# Patient Record
Sex: Male | Born: 1935 | Race: White | Hispanic: No | Marital: Married | Smoking: Former smoker
Health system: Southern US, Community
[De-identification: ages and names within clinical notes are randomized; demographics above are authoritative.]

## PROBLEM LIST (undated history)

## (undated) DIAGNOSIS — I6529 Occlusion and stenosis of unspecified carotid artery: Secondary | ICD-10-CM

## (undated) DIAGNOSIS — J449 Chronic obstructive pulmonary disease, unspecified: Secondary | ICD-10-CM

## (undated) DIAGNOSIS — K449 Diaphragmatic hernia without obstruction or gangrene: Secondary | ICD-10-CM

## (undated) DIAGNOSIS — R9389 Abnormal findings on diagnostic imaging of other specified body structures: Secondary | ICD-10-CM

## (undated) DIAGNOSIS — I429 Cardiomyopathy, unspecified: Secondary | ICD-10-CM

## (undated) DIAGNOSIS — K5730 Diverticulosis of large intestine without perforation or abscess without bleeding: Secondary | ICD-10-CM

## (undated) DIAGNOSIS — E785 Hyperlipidemia, unspecified: Secondary | ICD-10-CM

## (undated) DIAGNOSIS — R7302 Impaired glucose tolerance (oral): Secondary | ICD-10-CM

## (undated) DIAGNOSIS — D126 Benign neoplasm of colon, unspecified: Secondary | ICD-10-CM

## (undated) DIAGNOSIS — K219 Gastro-esophageal reflux disease without esophagitis: Secondary | ICD-10-CM

## (undated) DIAGNOSIS — E559 Vitamin D deficiency, unspecified: Secondary | ICD-10-CM

## (undated) DIAGNOSIS — M1710 Unilateral primary osteoarthritis, unspecified knee: Secondary | ICD-10-CM

## (undated) DIAGNOSIS — I10 Essential (primary) hypertension: Secondary | ICD-10-CM

## (undated) DIAGNOSIS — M545 Low back pain: Secondary | ICD-10-CM

## (undated) DIAGNOSIS — K648 Other hemorrhoids: Secondary | ICD-10-CM

## (undated) DIAGNOSIS — F1011 Alcohol abuse, in remission: Secondary | ICD-10-CM

## (undated) DIAGNOSIS — K279 Peptic ulcer, site unspecified, unspecified as acute or chronic, without hemorrhage or perforation: Secondary | ICD-10-CM

## (undated) DIAGNOSIS — S32009A Unspecified fracture of unspecified lumbar vertebra, initial encounter for closed fracture: Secondary | ICD-10-CM

## (undated) DIAGNOSIS — R972 Elevated prostate specific antigen [PSA]: Secondary | ICD-10-CM

## (undated) DIAGNOSIS — C679 Malignant neoplasm of bladder, unspecified: Secondary | ICD-10-CM

## (undated) DIAGNOSIS — K7030 Alcoholic cirrhosis of liver without ascites: Secondary | ICD-10-CM

## (undated) DIAGNOSIS — N259 Disorder resulting from impaired renal tubular function, unspecified: Secondary | ICD-10-CM

## (undated) DIAGNOSIS — H9120 Sudden idiopathic hearing loss, unspecified ear: Secondary | ICD-10-CM

## (undated) DIAGNOSIS — M810 Age-related osteoporosis without current pathological fracture: Secondary | ICD-10-CM

## (undated) DIAGNOSIS — I351 Nonrheumatic aortic (valve) insufficiency: Secondary | ICD-10-CM

## (undated) DIAGNOSIS — E739 Lactose intolerance, unspecified: Secondary | ICD-10-CM

## (undated) HISTORY — DX: Other hemorrhoids: K64.8

## (undated) HISTORY — DX: Chronic obstructive pulmonary disease, unspecified: J44.9

## (undated) HISTORY — DX: Diverticulosis of large intestine without perforation or abscess without bleeding: K57.30

## (undated) HISTORY — DX: Disorder resulting from impaired renal tubular function, unspecified: N25.9

## (undated) HISTORY — DX: Hyperlipidemia, unspecified: E78.5

## (undated) HISTORY — PX: LUMBAR LAMINECTOMY: SHX95

## (undated) HISTORY — DX: Alcohol abuse, in remission: F10.11

## (undated) HISTORY — DX: Elevated prostate specific antigen (PSA): R97.20

## (undated) HISTORY — DX: Diaphragmatic hernia without obstruction or gangrene: K44.9

## (undated) HISTORY — DX: Impaired glucose tolerance (oral): R73.02

## (undated) HISTORY — DX: Benign neoplasm of colon, unspecified: D12.6

## (undated) HISTORY — DX: Peptic ulcer, site unspecified, unspecified as acute or chronic, without hemorrhage or perforation: K27.9

## (undated) HISTORY — DX: Nonrheumatic aortic (valve) insufficiency: I35.1

## (undated) HISTORY — DX: Low back pain: M54.5

## (undated) HISTORY — DX: Sudden idiopathic hearing loss, unspecified ear: H91.20

## (undated) HISTORY — DX: Malignant neoplasm of bladder, unspecified: C67.9

## (undated) HISTORY — DX: Unilateral primary osteoarthritis, unspecified knee: M17.10

## (undated) HISTORY — DX: Gastro-esophageal reflux disease without esophagitis: K21.9

## (undated) HISTORY — DX: Unspecified fracture of unspecified lumbar vertebra, initial encounter for closed fracture: S32.009A

## (undated) HISTORY — DX: Alcoholic cirrhosis of liver without ascites: K70.30

## (undated) HISTORY — PX: OTHER SURGICAL HISTORY: SHX169

## (undated) HISTORY — DX: Age-related osteoporosis without current pathological fracture: M81.0

## (undated) HISTORY — DX: Abnormal findings on diagnostic imaging of other specified body structures: R93.89

## (undated) HISTORY — DX: Cardiomyopathy, unspecified: I42.9

## (undated) HISTORY — DX: Lactose intolerance, unspecified: E73.9

## (undated) HISTORY — DX: Vitamin D deficiency, unspecified: E55.9

## (undated) HISTORY — DX: Occlusion and stenosis of unspecified carotid artery: I65.29

## (undated) HISTORY — DX: Essential (primary) hypertension: I10

---

## 1984-01-26 DIAGNOSIS — D126 Benign neoplasm of colon, unspecified: Secondary | ICD-10-CM

## 1984-01-26 HISTORY — DX: Benign neoplasm of colon, unspecified: D12.6

## 2000-04-27 HISTORY — PX: CHOLECYSTECTOMY: SHX55

## 2000-05-18 ENCOUNTER — Encounter (INDEPENDENT_AMBULATORY_CARE_PROVIDER_SITE_OTHER): Payer: Self-pay | Admitting: Specialist

## 2000-05-18 ENCOUNTER — Encounter (INDEPENDENT_AMBULATORY_CARE_PROVIDER_SITE_OTHER): Payer: Self-pay | Admitting: *Deleted

## 2000-05-18 ENCOUNTER — Inpatient Hospital Stay (HOSPITAL_COMMUNITY): Admission: EM | Admit: 2000-05-18 | Discharge: 2000-05-20 | Payer: Self-pay | Admitting: Emergency Medicine

## 2000-05-19 ENCOUNTER — Encounter: Payer: Self-pay | Admitting: Endocrinology

## 2000-05-19 ENCOUNTER — Encounter: Payer: Self-pay | Admitting: Anesthesiology

## 2000-06-23 ENCOUNTER — Encounter: Admission: RE | Admit: 2000-06-23 | Discharge: 2000-06-23 | Payer: Self-pay | Admitting: General Surgery

## 2000-06-23 ENCOUNTER — Encounter: Payer: Self-pay | Admitting: General Surgery

## 2000-06-24 ENCOUNTER — Other Ambulatory Visit: Admission: RE | Admit: 2000-06-24 | Discharge: 2000-06-24 | Payer: Self-pay | Admitting: Gastroenterology

## 2000-06-24 ENCOUNTER — Encounter (INDEPENDENT_AMBULATORY_CARE_PROVIDER_SITE_OTHER): Payer: Self-pay | Admitting: Specialist

## 2000-06-25 ENCOUNTER — Ambulatory Visit (HOSPITAL_COMMUNITY): Admission: RE | Admit: 2000-06-25 | Discharge: 2000-06-25 | Payer: Self-pay | Admitting: *Deleted

## 2000-06-27 HISTORY — PX: OTHER SURGICAL HISTORY: SHX169

## 2000-06-30 ENCOUNTER — Encounter: Payer: Self-pay | Admitting: General Surgery

## 2000-06-30 ENCOUNTER — Inpatient Hospital Stay (HOSPITAL_COMMUNITY): Admission: EM | Admit: 2000-06-30 | Discharge: 2000-07-14 | Payer: Self-pay | Admitting: *Deleted

## 2000-07-01 ENCOUNTER — Encounter: Payer: Self-pay | Admitting: General Surgery

## 2000-07-02 ENCOUNTER — Encounter: Payer: Self-pay | Admitting: Specialist

## 2000-07-04 ENCOUNTER — Encounter: Payer: Self-pay | Admitting: General Surgery

## 2000-07-09 ENCOUNTER — Encounter: Payer: Self-pay | Admitting: General Surgery

## 2000-12-14 ENCOUNTER — Ambulatory Visit (HOSPITAL_COMMUNITY): Admission: RE | Admit: 2000-12-14 | Discharge: 2000-12-14 | Payer: Self-pay | Admitting: Urology

## 2000-12-14 ENCOUNTER — Encounter (INDEPENDENT_AMBULATORY_CARE_PROVIDER_SITE_OTHER): Payer: Self-pay

## 2001-07-19 ENCOUNTER — Encounter: Payer: Self-pay | Admitting: Urology

## 2001-07-19 ENCOUNTER — Encounter: Admission: RE | Admit: 2001-07-19 | Discharge: 2001-07-19 | Payer: Self-pay | Admitting: Urology

## 2001-07-26 ENCOUNTER — Ambulatory Visit (HOSPITAL_BASED_OUTPATIENT_CLINIC_OR_DEPARTMENT_OTHER): Admission: RE | Admit: 2001-07-26 | Discharge: 2001-07-26 | Payer: Self-pay | Admitting: Urology

## 2001-07-26 ENCOUNTER — Encounter (INDEPENDENT_AMBULATORY_CARE_PROVIDER_SITE_OTHER): Payer: Self-pay

## 2001-08-11 ENCOUNTER — Encounter: Payer: Self-pay | Admitting: Urology

## 2001-08-11 ENCOUNTER — Encounter: Admission: RE | Admit: 2001-08-11 | Discharge: 2001-08-11 | Payer: Self-pay | Admitting: Urology

## 2002-02-14 ENCOUNTER — Encounter (INDEPENDENT_AMBULATORY_CARE_PROVIDER_SITE_OTHER): Payer: Self-pay | Admitting: Specialist

## 2002-02-14 ENCOUNTER — Ambulatory Visit (HOSPITAL_BASED_OUTPATIENT_CLINIC_OR_DEPARTMENT_OTHER): Admission: RE | Admit: 2002-02-14 | Discharge: 2002-02-14 | Payer: Self-pay | Admitting: Urology

## 2005-06-10 ENCOUNTER — Ambulatory Visit: Payer: Self-pay | Admitting: Internal Medicine

## 2006-06-24 ENCOUNTER — Ambulatory Visit: Payer: Self-pay | Admitting: Internal Medicine

## 2006-12-02 ENCOUNTER — Encounter: Admission: RE | Admit: 2006-12-02 | Discharge: 2006-12-02 | Payer: Self-pay | Admitting: Orthopedic Surgery

## 2007-05-11 ENCOUNTER — Ambulatory Visit: Payer: Self-pay | Admitting: Internal Medicine

## 2007-05-31 ENCOUNTER — Ambulatory Visit: Payer: Self-pay | Admitting: Gastroenterology

## 2007-06-09 ENCOUNTER — Encounter: Payer: Self-pay | Admitting: Internal Medicine

## 2007-06-09 ENCOUNTER — Ambulatory Visit: Payer: Self-pay | Admitting: Gastroenterology

## 2007-06-17 DIAGNOSIS — Z860100 Personal history of colon polyps, unspecified: Secondary | ICD-10-CM | POA: Insufficient documentation

## 2007-06-17 DIAGNOSIS — E739 Lactose intolerance, unspecified: Secondary | ICD-10-CM

## 2007-06-17 DIAGNOSIS — E785 Hyperlipidemia, unspecified: Secondary | ICD-10-CM

## 2007-06-17 DIAGNOSIS — I10 Essential (primary) hypertension: Secondary | ICD-10-CM

## 2007-06-17 DIAGNOSIS — K279 Peptic ulcer, site unspecified, unspecified as acute or chronic, without hemorrhage or perforation: Secondary | ICD-10-CM

## 2007-06-17 DIAGNOSIS — C679 Malignant neoplasm of bladder, unspecified: Secondary | ICD-10-CM | POA: Insufficient documentation

## 2007-06-17 DIAGNOSIS — K219 Gastro-esophageal reflux disease without esophagitis: Secondary | ICD-10-CM

## 2007-06-17 DIAGNOSIS — F1011 Alcohol abuse, in remission: Secondary | ICD-10-CM

## 2007-06-17 DIAGNOSIS — K5730 Diverticulosis of large intestine without perforation or abscess without bleeding: Secondary | ICD-10-CM | POA: Insufficient documentation

## 2007-06-17 DIAGNOSIS — K7030 Alcoholic cirrhosis of liver without ascites: Secondary | ICD-10-CM | POA: Insufficient documentation

## 2007-06-17 DIAGNOSIS — M810 Age-related osteoporosis without current pathological fracture: Secondary | ICD-10-CM

## 2007-06-17 HISTORY — DX: Hyperlipidemia, unspecified: E78.5

## 2007-06-17 HISTORY — DX: Alcohol abuse, in remission: F10.11

## 2007-06-17 HISTORY — DX: Diverticulosis of large intestine without perforation or abscess without bleeding: K57.30

## 2007-06-17 HISTORY — DX: Malignant neoplasm of bladder, unspecified: C67.9

## 2007-06-17 HISTORY — DX: Lactose intolerance, unspecified: E73.9

## 2007-06-17 HISTORY — DX: Peptic ulcer, site unspecified, unspecified as acute or chronic, without hemorrhage or perforation: K27.9

## 2007-06-17 HISTORY — DX: Alcoholic cirrhosis of liver without ascites: K70.30

## 2007-06-17 HISTORY — DX: Essential (primary) hypertension: I10

## 2007-06-17 HISTORY — DX: Age-related osteoporosis without current pathological fracture: M81.0

## 2007-06-17 HISTORY — DX: Gastro-esophageal reflux disease without esophagitis: K21.9

## 2007-06-18 ENCOUNTER — Ambulatory Visit: Payer: Self-pay | Admitting: Internal Medicine

## 2007-06-18 DIAGNOSIS — R21 Rash and other nonspecific skin eruption: Secondary | ICD-10-CM

## 2007-06-18 DIAGNOSIS — R5383 Other fatigue: Secondary | ICD-10-CM | POA: Insufficient documentation

## 2007-06-18 LAB — CONVERTED CEMR LAB
AST: 28 units/L (ref 0–37)
Albumin: 4.2 g/dL (ref 3.5–5.2)
Alkaline Phosphatase: 79 units/L (ref 39–117)
BUN: 23 mg/dL (ref 6–23)
Basophils Absolute: 0 10*3/uL (ref 0.0–0.1)
CO2: 31 meq/L (ref 19–32)
Calcium: 9.9 mg/dL (ref 8.4–10.5)
Chloride: 106 meq/L (ref 96–112)
Cholesterol: 196 mg/dL (ref 0–200)
Eosinophils Relative: 3.2 % (ref 0.0–5.0)
GFR calc non Af Amer: 48 mL/min
HDL: 50.9 mg/dL (ref >39.0)
Hgb A1c MFr Bld: 5.7 % (ref 4.6–6.0)
Lymphocytes Relative: 24.5 % (ref 12.0–46.0)
MCHC: 34 g/dL (ref 30.0–36.0)
MCV: 99.4 fL (ref 78.0–100.0)
Monocytes Absolute: 0.6 10*3/uL (ref 0.2–0.7)
PSA: 4.01 ng/mL — ABNORMAL HIGH (ref 0.10–4.00)
Platelets: 185 10*3/uL (ref 150–400)
Potassium: 4.7 meq/L (ref 3.5–5.1)
RBC: 3.9 M/uL — ABNORMAL LOW (ref 4.22–5.81)
TSH: 1.36 microintl units/mL (ref 0.35–5.50)
Total Bilirubin: 0.7 mg/dL (ref 0.3–1.2)
Total CHOL/HDL Ratio: 3.9
Total Protein: 7.6 g/dL (ref 6.0–8.3)
Triglycerides: 118 mg/dL (ref 0–149)
WBC: 6.8 10*3/uL (ref 4.5–10.5)

## 2007-12-17 ENCOUNTER — Ambulatory Visit: Payer: Self-pay | Admitting: Internal Medicine

## 2007-12-17 DIAGNOSIS — I498 Other specified cardiac arrhythmias: Secondary | ICD-10-CM | POA: Insufficient documentation

## 2008-05-04 ENCOUNTER — Ambulatory Visit: Payer: Self-pay | Admitting: Internal Medicine

## 2008-06-20 ENCOUNTER — Ambulatory Visit: Payer: Self-pay | Admitting: Internal Medicine

## 2008-06-20 DIAGNOSIS — R0609 Other forms of dyspnea: Secondary | ICD-10-CM

## 2008-06-20 DIAGNOSIS — R0989 Other specified symptoms and signs involving the circulatory and respiratory systems: Secondary | ICD-10-CM

## 2008-06-20 LAB — CONVERTED CEMR LAB
ALT: 25 U/L
AST: 28 U/L
Albumin: 4 g/dL
Alkaline Phosphatase: 74 U/L
BUN: 29 mg/dL — ABNORMAL HIGH
Bacteria, UA: NEGATIVE
Basophils Absolute: 0.1 K/uL
Basophils Relative: 0.7 %
Bilirubin Urine: NEGATIVE
Bilirubin, Direct: 0.1 mg/dL
CO2: 30 meq/L
Calcium: 9.3 mg/dL
Chloride: 106 meq/L
Cholesterol: 189 mg/dL
Creatinine, Ser: 1.6 mg/dL — ABNORMAL HIGH
Crystals: NEGATIVE
Eosinophils Absolute: 0.2 K/uL
Eosinophils Relative: 2.4 %
GFR calc Af Amer: 54 mL/min
GFR calc non Af Amer: 44 mL/min
Glucose, Bld: 104 mg/dL — ABNORMAL HIGH
HCT: 36.1 % — ABNORMAL LOW
HDL: 52.4 mg/dL
Hemoglobin, Urine: NEGATIVE
Hemoglobin: 12.5 g/dL — ABNORMAL LOW
Hgb A1c MFr Bld: 5.6 %
Ketones, ur: NEGATIVE mg/dL
LDL Cholesterol: 118 mg/dL — ABNORMAL HIGH
Lymphocytes Relative: 22.8 %
MCHC: 34.6 g/dL
MCV: 99 fL
Monocytes Absolute: 0.7 K/uL
Monocytes Relative: 9.5 %
Mucus, UA: NEGATIVE
Neutro Abs: 5 K/uL
Neutrophils Relative %: 64.6 %
Nitrite: NEGATIVE
PSA: 5.15 ng/mL — ABNORMAL HIGH
Platelets: 163 K/uL
Potassium: 4.4 meq/L
RBC / HPF: NONE SEEN
RBC: 3.64 M/uL — ABNORMAL LOW
RDW: 11.6 %
Sodium: 142 meq/L
Specific Gravity, Urine: 1.02
Squamous Epithelial / HPF: NEGATIVE /LPF
TSH: 1.33 u[IU]/mL
Total Bilirubin: 0.6 mg/dL
Total CHOL/HDL Ratio: 3.6
Total Protein: 7.3 g/dL
Triglycerides: 93 mg/dL
Urine Glucose: NEGATIVE mg/dL
Urobilinogen, UA: 0.2
VLDL: 19 mg/dL
WBC: 7.8 10*3/microliter
pH: 6.5

## 2008-06-27 ENCOUNTER — Encounter: Payer: Self-pay | Admitting: Internal Medicine

## 2008-06-27 ENCOUNTER — Ambulatory Visit: Payer: Self-pay | Admitting: Internal Medicine

## 2008-07-05 ENCOUNTER — Ambulatory Visit: Payer: Self-pay

## 2008-07-05 ENCOUNTER — Encounter: Payer: Self-pay | Admitting: Internal Medicine

## 2008-07-06 ENCOUNTER — Encounter: Payer: Self-pay | Admitting: Internal Medicine

## 2008-07-06 DIAGNOSIS — R9389 Abnormal findings on diagnostic imaging of other specified body structures: Secondary | ICD-10-CM

## 2008-07-06 HISTORY — DX: Abnormal findings on diagnostic imaging of other specified body structures: R93.89

## 2008-07-11 ENCOUNTER — Ambulatory Visit: Payer: Self-pay | Admitting: Cardiovascular Disease

## 2008-07-19 ENCOUNTER — Ambulatory Visit: Payer: Self-pay

## 2008-09-18 ENCOUNTER — Ambulatory Visit: Payer: Self-pay | Admitting: Internal Medicine

## 2008-09-18 DIAGNOSIS — J4489 Other specified chronic obstructive pulmonary disease: Secondary | ICD-10-CM

## 2008-09-18 DIAGNOSIS — N189 Chronic kidney disease, unspecified: Secondary | ICD-10-CM

## 2008-09-18 DIAGNOSIS — N259 Disorder resulting from impaired renal tubular function, unspecified: Secondary | ICD-10-CM

## 2008-09-18 DIAGNOSIS — E559 Vitamin D deficiency, unspecified: Secondary | ICD-10-CM

## 2008-09-18 DIAGNOSIS — J449 Chronic obstructive pulmonary disease, unspecified: Secondary | ICD-10-CM

## 2008-09-18 DIAGNOSIS — R972 Elevated prostate specific antigen [PSA]: Secondary | ICD-10-CM

## 2008-09-18 HISTORY — DX: Disorder resulting from impaired renal tubular function, unspecified: N25.9

## 2008-09-18 HISTORY — DX: Elevated prostate specific antigen (PSA): R97.20

## 2008-09-18 HISTORY — DX: Chronic obstructive pulmonary disease, unspecified: J44.9

## 2008-09-18 HISTORY — DX: Vitamin D deficiency, unspecified: E55.9

## 2008-09-18 HISTORY — DX: Other specified chronic obstructive pulmonary disease: J44.89

## 2009-03-20 ENCOUNTER — Ambulatory Visit: Payer: Self-pay | Admitting: Internal Medicine

## 2009-03-20 DIAGNOSIS — H9120 Sudden idiopathic hearing loss, unspecified ear: Secondary | ICD-10-CM | POA: Insufficient documentation

## 2009-03-20 HISTORY — DX: Sudden idiopathic hearing loss, unspecified ear: H91.20

## 2009-06-19 ENCOUNTER — Ambulatory Visit: Payer: Self-pay | Admitting: Internal Medicine

## 2009-06-19 DIAGNOSIS — M1710 Unilateral primary osteoarthritis, unspecified knee: Secondary | ICD-10-CM

## 2009-06-19 DIAGNOSIS — IMO0002 Reserved for concepts with insufficient information to code with codable children: Secondary | ICD-10-CM

## 2009-06-19 HISTORY — DX: Reserved for concepts with insufficient information to code with codable children: IMO0002

## 2009-06-20 LAB — CONVERTED CEMR LAB
Albumin: 4.1 g/dL (ref 3.5–5.2)
Basophils Absolute: 0 10*3/uL (ref 0.0–0.1)
CO2: 29 meq/L (ref 19–32)
Calcium: 9.3 mg/dL (ref 8.4–10.5)
Creatinine, Ser: 1.6 mg/dL — ABNORMAL HIGH (ref 0.4–1.5)
Folate: >20 ng/mL
HDL: 49.6 mg/dL (ref >39.00)
Lymphocytes Relative: 23.2 % (ref 12.0–46.0)
Lymphs Abs: 2 10*3/uL (ref 0.7–4.0)
Neutro Abs: 5.8 10*3/uL (ref 1.4–7.7)
Neutrophils Relative %: 66.5 % (ref 43.0–77.0)
Nitrite: NEGATIVE
Platelets: 175 10*3/uL (ref 150.0–400.0)
Potassium: 5 meq/L (ref 3.5–5.1)
RBC: 3.56 M/uL — ABNORMAL LOW (ref 4.22–5.81)
RDW: 12.5 % (ref 11.5–14.6)
Sodium: 144 meq/L (ref 135–145)
TSH: 0.76 microintl units/mL (ref 0.35–5.50)
Total Bilirubin: 0.7 mg/dL (ref 0.3–1.2)
Total CHOL/HDL Ratio: 3
Total Protein, Urine: 30 mg/dL
Triglycerides: 102 mg/dL (ref 0.0–149.0)
Urine Glucose: NEGATIVE mg/dL
pH: 5.5 (ref 5.0–8.0)

## 2009-06-22 ENCOUNTER — Telehealth (INDEPENDENT_AMBULATORY_CARE_PROVIDER_SITE_OTHER): Payer: Self-pay | Admitting: *Deleted

## 2009-07-23 ENCOUNTER — Ambulatory Visit: Payer: Self-pay

## 2009-07-23 ENCOUNTER — Encounter: Payer: Self-pay | Admitting: Cardiovascular Disease

## 2009-07-23 DIAGNOSIS — I6529 Occlusion and stenosis of unspecified carotid artery: Secondary | ICD-10-CM

## 2009-07-23 HISTORY — DX: Occlusion and stenosis of unspecified carotid artery: I65.29

## 2009-08-09 ENCOUNTER — Ambulatory Visit: Payer: Self-pay | Admitting: Internal Medicine

## 2009-08-09 ENCOUNTER — Encounter: Payer: Self-pay | Admitting: Internal Medicine

## 2009-08-09 DIAGNOSIS — K5900 Constipation, unspecified: Secondary | ICD-10-CM | POA: Insufficient documentation

## 2009-08-09 DIAGNOSIS — S32009A Unspecified fracture of unspecified lumbar vertebra, initial encounter for closed fracture: Secondary | ICD-10-CM | POA: Insufficient documentation

## 2009-08-09 DIAGNOSIS — M549 Dorsalgia, unspecified: Secondary | ICD-10-CM | POA: Insufficient documentation

## 2009-08-09 HISTORY — DX: Unspecified fracture of unspecified lumbar vertebra, initial encounter for closed fracture: S32.009A

## 2009-08-09 LAB — CONVERTED CEMR LAB
Nitrite: NEGATIVE
Specific Gravity, Urine: 1.015 (ref 1.000–1.030)

## 2009-08-14 ENCOUNTER — Telehealth: Payer: Self-pay | Admitting: Internal Medicine

## 2009-08-15 ENCOUNTER — Telehealth: Payer: Self-pay | Admitting: Internal Medicine

## 2009-08-17 ENCOUNTER — Ambulatory Visit: Payer: Self-pay | Admitting: Internal Medicine

## 2009-08-21 ENCOUNTER — Telehealth: Payer: Self-pay | Admitting: Internal Medicine

## 2009-08-23 ENCOUNTER — Ambulatory Visit (HOSPITAL_COMMUNITY): Admission: RE | Admit: 2009-08-23 | Discharge: 2009-08-23 | Payer: Self-pay | Admitting: Interventional Radiology

## 2009-08-24 ENCOUNTER — Ambulatory Visit (HOSPITAL_COMMUNITY): Admission: RE | Admit: 2009-08-24 | Discharge: 2009-08-24 | Payer: Self-pay | Admitting: Interventional Radiology

## 2009-09-07 ENCOUNTER — Encounter: Payer: Self-pay | Admitting: Interventional Radiology

## 2009-09-26 ENCOUNTER — Telehealth: Payer: Self-pay | Admitting: Internal Medicine

## 2009-12-17 ENCOUNTER — Ambulatory Visit: Payer: Self-pay | Admitting: Internal Medicine

## 2009-12-17 DIAGNOSIS — M545 Low back pain, unspecified: Secondary | ICD-10-CM

## 2009-12-17 HISTORY — DX: Low back pain, unspecified: M54.50

## 2010-06-17 ENCOUNTER — Ambulatory Visit: Payer: Self-pay | Admitting: Internal Medicine

## 2010-06-18 LAB — CONVERTED CEMR LAB
ALT: 19 units/L (ref 0–53)
AST: 27 units/L (ref 0–37)
Alkaline Phosphatase: 77 units/L (ref 39–117)
Basophils Relative: 0.4 % (ref 0.0–3.0)
Bilirubin, Direct: 0.1 mg/dL (ref 0.0–0.3)
CO2: 29 meq/L (ref 19–32)
Chloride: 103 meq/L (ref 96–112)
Creatinine, Ser: 1.4 mg/dL (ref 0.4–1.5)
Eosinophils Absolute: 0.1 10*3/uL (ref 0.0–0.7)
Eosinophils Relative: 0.8 % (ref 0.0–5.0)
HCT: 35.1 % — ABNORMAL LOW (ref 39.0–52.0)
HDL: 46.5 mg/dL (ref >39.00)
Hgb A1c MFr Bld: 5.7 % (ref 4.6–6.5)
LDL Cholesterol: 81 mg/dL (ref 0–99)
Lymphs Abs: 1.6 10*3/uL (ref 0.7–4.0)
MCV: 98.4 fL (ref 78.0–100.0)
Monocytes Absolute: 0.5 10*3/uL (ref 0.1–1.0)
Platelets: 162 10*3/uL (ref 150.0–400.0)
RDW: 12.7 % (ref 11.5–14.6)
Vit D, 25-Hydroxy: 25 ng/mL — ABNORMAL LOW (ref 30–89)

## 2010-06-25 ENCOUNTER — Encounter: Payer: Self-pay | Admitting: Internal Medicine

## 2010-07-24 ENCOUNTER — Encounter: Payer: Self-pay | Admitting: Cardiovascular Disease

## 2010-07-26 ENCOUNTER — Encounter: Payer: Self-pay | Admitting: Cardiovascular Disease

## 2010-07-26 ENCOUNTER — Ambulatory Visit: Payer: Self-pay

## 2010-08-27 NOTE — Letter (Signed)
Summary: Handicapped Placard/[facility]  Handicapped Placard/[facility]   Imported By: Sherian Rein 06/27/2010 10:09:50  _____________________________________________________________________  External Attachment:    Type:   Image     Comment:   External Document

## 2010-08-27 NOTE — Progress Notes (Signed)
Summary: WANTING RESULTS  Phone Note Call from Patient Call back at Home Phone [phone]   Caller: Patient Summary of Call: PT IS REQUESTING X-RAY RESULTS.  WANTS TO KNOW WHAT IS WRONG WITH HIS BACK.  ALSO LAB RESULTS.   Initial call taken by: Hilarie Fredrickson,  August 14, 2009 11:42 AM  Follow-up for Phone Call        pt called to inform MD that he has tried Milk of Magnesium, Miralax and "some other laxative" and it has not helped. pt says that he has not had a BM in 6 days and now is having reflux, and burping. please advise Follow-up by: Margaret Pyle, CMA,  August 14, 2009 2:15 PM  Additional Follow-up for Phone Call Additional follow up Details #1::        he should take Magnesuim Citrate - 1  bottle today Additional Follow-up by: Corwin Levins MD,  August 14, 2009 2:28 PM    Additional Follow-up for Phone Call Additional follow up Details #2::    pt informed Follow-up by: Margaret Pyle, CMA,  August 14, 2009 2:37 PM

## 2010-08-27 NOTE — Progress Notes (Signed)
Summary: Oxycodone  Phone Note Call from Patient   Caller: Patient Walk In Summary of Call: pt came into office requesting refills of Oxycodone. [facility] [address] Initial call taken by: Margaret Pyle, CMA,  September 26, 2009 2:30 PM  Follow-up for Phone Call        pt's wife informed Follow-up by: Margaret Pyle, CMA,  September 26, 2009 2:39 PM    New/Updated Medications: OXYCODONE HCL 5 MG TABS (OXYCODONE HCL) 1 - 3 by mouth every 6 hrs as needed for pain -  please make return office visit for further refills Prescriptions: OXYCODONE HCL 5 MG TABS (OXYCODONE HCL) 1 - 3 by mouth every 6 hrs as needed for pain -  please make return office visit for further refills  #100 x 0   Entered and Authorized by:   Corwin Levins MD   Signed by:   Corwin Levins MD on 09/26/2009   Method used:   Print then Give to Patient   RxID:   [id number]  done hardcopy to LIM side B - dahlia Corwin Levins MD  September 26, 2009 2:36 PM

## 2010-08-27 NOTE — Progress Notes (Signed)
Summary: Referral/appt status  Phone Note Call from Patient Call back at Home Phone [phone]   Caller: Spouse Summary of Call: Patient spouse called checking the status of referral to Dr. Link Snuffer. Please call her re: appt. Initial call taken by: Lucious Groves,  August 15, 2009 11:26 AM  Follow-up for Phone Call        Faxed notes to dr Corliss Skains office .awaiting appt Shelbie Proctor  August 10, 2009 10:39 AM Dr. Pollyann Savoy [phone]  spoke with pt informed him of the staus of referral  pt wife states she was not  calling about the referral  , want to know test results  of x ray  Follow-up by: Shelbie Proctor,  August 16, 2009 8:36 AM  Additional Follow-up for Phone Call Additional follow up Details #1::        called pt's wife and she will have grand daughter call back because she was not able to understand results. Additional Follow-up by: Margaret Pyle, CMA,  August 16, 2009 8:45 AM     Appended Document: Referral/appt status pt refuses MRI (see EMR notes) due to metal in his ear;  saw pt today and pt wants to hold off on Dr Corliss Skains as well - I canceled the referrals in the emr this AM  ---- 08/17/2009 1:19 PM, Shelbie Proctor wrote: Dr .Jonny Ruiz - Received a call from Dr. Young Berry [facility] -pt was refered to his office for a compression Fracture, Lumbar Vertebrae (ICD-805.4) special procedures eval for need for kyphoplasty/vertebroplasty.Marland KitchenMarland KitchenDr Young Berry  states he want this pt to have an MRI  first -T10 S-1 without contrast lumbar spine . would like Dr Jonny Ruiz to put in an order for this prefer pt to have this early morning if possible so they can see the pt in the afternoon .[phone]  per Jewel  INFORMED DR  Young Berry OFFICE TO INFORM

## 2010-08-27 NOTE — Miscellaneous (Signed)
Summary: Orders Update   Clinical Lists Changes  Problems: Added new problem of COMPRESSION FRACTURE, LUMBAR VERTEBRAE (ICD-805.4) Orders: Added new Referral order of Radiology Referral (Radiology) - Signed Added new Referral order of Misc. Referral (Misc. Ref) - Signed

## 2010-08-27 NOTE — Progress Notes (Signed)
----   Converted from flag ---- ---- 08/14/2009 12:13 PM, Corwin Levins MD wrote: ok to hold off on MRI for now then, but still should proceed with referral to dr Titus Dubin  ---- 08/13/2009 11:18 AM, Shelbie Proctor wrote:   ---- 08/10/2009 10:56 AM, Shelbie Proctor wrote: pt states he can not have the MRI done because of the metal in his ear ,please advise referral is for LS Spine MRI - no CM- also pt states he tried to have MRI done in the past but could not do them because of the metal in his ears . ------------------------------

## 2010-08-27 NOTE — Assessment & Plan Note (Signed)
Summary: 6 MO ROV /NWS  #   Vital Signs:  Patient profile:   77 year old male Height:      72 inches Weight:      136 pounds BMI:     18.51 O2 Sat:      96 % on Room air Temp:     97.9 degrees F oral Pulse rate:   55 / minute BP sitting:   132 / 48  (left arm) Cuff size:   regular  Vitals Entered By: Zella Ball Ewing CMA (AAMA) (June 17, 2010 2:22 PM)  O2 Flow:  Room air  Preventive Care Screening     no longer sees Dr Isabel Caprice for prostate currently after 77 yo  CC: 6 month ROV/RE   CC:  6 month ROV/RE.  History of Present Illness: here to f/u;  has cane and no recent falls; still with ongoing balance problem;  declines PT therapy for now;  back pain stable chronic daily , moderate without bowel or bladder symptoms,  no LE pain/weak/numb, further gait change or falls.  No fever, wt loss.  Trying to use the oxycodone sparingly.   Pt denies CP, worsening sob, doe, wheezing, orthopnea, pnd, worsening LE edema, palps, dizziness or syncope  Pt denies new neuro symptoms such as headache, facial or extremity weakness  Pt denies polydipsia, polyuria, or low sugar symptoms such as shakiness improved with eating.  Overall good compliance with meds, trying to follow low chol  diet, wt stable, little excercise however Due for flu shot.  Has some hearing loss to hte left ear for 1 wk - needs wax removed. No pain, just itches. Needs pain med refill.   Problems Prior to Update: 1)  Need Prophylactic Vaccination&inoculation Flu  (ICD-V04.81) 2)  Preventive Health Care  (ICD-V70.0) 3)  Low Back Pain, Chronic  (ICD-724.2) 4)  Compression Fracture, Lumbar Vertebrae  (ICD-805.4) 5)  Constipation  (ICD-564.00) 6)  Back Pain  (ICD-724.5) 7)  Carotid Artery Disease  (ICD-433.10) 8)  Osteoarthritis, Knees, Bilateral  (ICD-715.96) 9)  Unspecified Sudden Hearing Loss  (ICD-388.2) 10)  Renal Insufficiency  (ICD-588.9) 11)  COPD  (ICD-496) 12)  Vitamin D Deficiency  (ICD-268.9) 13)  Psa, Increased   (ICD-790.93) 14)  Echocardiogram, Abnormal  (ICD-793.2) 15)  Dyspnea On Exertion  (ICD-786.09) 16)  Bradycardia  (ICD-427.89) 17)  Special Screening Malignant Neoplasm of Prostate  (ICD-V76.44) 18)  Fatigue  (ICD-780.79) 19)  Rash-nonvesicular  (ICD-782.1) 20)  Family History Diabetes 1st Degree Relative  (ICD-V18.0) 21)  Alcoholic Cirrhosis of Liver  (ZOX-096.0) 22)  Abuse, Alcohol, in Remission  (ICD-305.03) 23)  Osteoporosis  (ICD-733.00) 24)  Peptic Ulcer Disease  (ICD-533.90) 25)  Diverticulosis, Colon  (ICD-562.10) 26)  Colonic Polyps, Hx of  (ICD-V12.72) 27)  Bladder Cancer  (ICD-188.9) 28)  Glucose Intolerance  (ICD-271.3) 29)  Hyperlipidemia  (ICD-272.4) 30)  Hypertension  (ICD-401.9) 31)  Gerd  (ICD-530.81)  Medications Prior to Update: 1)  Prilosec Otc 20 Mg Tbec (Omeprazole Magnesium) .... Take 2 Tablet By Mouth Once A Day 2)  Oxycodone Hcl 5 Mg Tabs (Oxycodone Hcl) .Marland Kitchen.. 1 - 3 By Mouth Every 6 Hrs As Needed For Pain 3)  Ecotrin Low Strength 81 Mg  Tbec (Aspirin) .Marland Kitchen.. 1 By Mouth Qd 4)  Combivent 103-18 Mcg/act Aero (Ipratropium-Albuterol) .... 2 Puffs Four Time Per Day As Needed 5)  Pravachol 40 Mg Tabs (Pravastatin Sodium) .Marland Kitchen.. 1po Once Daily 6)  Multivitamins  Tabs (Multiple Vitamin) .Marland Kitchen.. 1 By Mouth Qd  Current Medications (verified): 1)  Prilosec Otc 20 Mg Tbec (Omeprazole Magnesium) .... Take 2 Tablet By Mouth Once A Day 2)  Oxycodone Hcl 5 Mg Tabs (Oxycodone Hcl) .Marland Kitchen.. 1 - 3 By Mouth Every 6 Hrs As Needed For Pain 3)  Ecotrin Low Strength 81 Mg  Tbec (Aspirin) .Marland Kitchen.. 1 By Mouth Qd 4)  Combivent 103-18 Mcg/act Aero (Ipratropium-Albuterol) .... 2 Puffs Four Time Per Day As Needed 5)  Pravachol 40 Mg Tabs (Pravastatin Sodium) .Marland Kitchen.. 1po Once Daily 6)  Multivitamins  Tabs (Multiple Vitamin) .Marland Kitchen.. 1 By Mouth Qd  Allergies (verified): 1)  ! Augmentin (Amoxicillin-Pot Clavulanate) 2)  ! Ibuprofen (Ibuprofen)  Past History:  Past Medical History: Last updated:  12/17/2009 GERD Hypertension Hyperlipidemia glucose intolerance bladder cancer Colonic polyps, hx of hx of duodenal adenoma Diverticulosis, colon Peptic ulcer disease PMR Osteoporosis s/p left femur fx 1988, walks with cane ETOH dependence in remission mild hepatic cirrhosis low vitamin D elevated PSA COPD Renal insufficiency chronic LBP  Past Surgical History: Last updated: 06/20/2008 s/p small bowel obstruction surgury 12/01 s/p bilroth II Cholecystectomy Lumbar laminectomy x 2 Inguinal herniorrhaphy ds/p left middle ear surgury - dr Ann Maki - late 1980's  Social History: Last updated: 06/17/2010 Former Smoker Alcohol use-no Married 2 children 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  , or hypersomnia  Physical Exam  General:  alert and underweight appearing.   Head:  normocephalic and atraumatic.   Eyes:  vision grossly intact, pupils equal, and pupils round.   Ears:  R ear normal and L ear normal.  after left ear irrigation Nose:  no external deformity and no nasal discharge.   Mouth:  no gingival abnormalities and pharynx pink and moist.   Neck:  supple and no masses.   Lungs:  normal respiratory effort and normal breath sounds.   Heart:  normal rate and regular rhythm.   Abdomen:  soft, non-tender, and normal bowel sounds.   Msk:  no joint tenderness and no joint swelling, spine nontender today.   Extremities:  no edema, no erythema  Neurologic:   cranial nerves II-XII intact and strength normal in all extremities.  but frail and mild imbalance to walking with cance Psych:  not anxious appearing and not depressed appearing.     Impression & Recommendations:  Problem # 1:  LOW BACK PAIN, CHRONIC (ICD-724.2)  His updated medication list for this problem includes:    Oxycodone Hcl 5 Mg Tabs (Oxycodone hcl) .Marland Kitchen... 1 - 3 by mouth every 6 hrs as needed for pain    Ecotrin Low Strength 81 Mg Tbec (Aspirin) .Marland Kitchen... 1 by mouth qd stable overall by hx and exam, ok to continue meds/tx as is   Problem # 2:  HYPERLIPIDEMIA (ICD-272.4)  His updated medication list for this problem includes:    Pravachol 40 Mg Tabs (Pravastatin sodium) .Marland Kitchen... 1po once daily  Orders: TLB-Lipid Panel (80061-LIPID)  Labs Reviewed: SGOT: 28 (06/19/2009)   SGPT: 20 (06/19/2009)  HDL:49.60 (06/19/2009), 52.4 (06/20/2008)  LDL:83 (06/19/2009), 118 (11/91/4782)  Chol:153 (06/19/2009), 189 (06/20/2008)  Trig:102.0 (06/19/2009), 93 (06/20/2008) stable overall by hx and exam, ok to continue meds/tx as is . Pt to continue diet efforts, good med tolerance; to check labs - goal LDL less than 70   Problem # 3:  RENAL INSUFFICIENCY (ICD-588.9) due for f/u renal labs today Orders: TLB-BMP (Basic Metabolic Panel-BMET) (80048-METABOL)  Problem # 4:  FATIGUE (ICD-780.79)  mild - exam benign, to check labs below; follow with expectant management   Orders: TLB-CBC Platelet - w/Differential (85025-CBCD) TLB-Hepatic/Liver Function Pnl (80076-HEPATIC) TLB-TSH (Thyroid Stimulating Hormone) (84443-TSH)  Problem # 5:  HYPERTENSION (ICD-401.9)  BP today: 132/48 Prior BP: 142/70 (12/17/2009)  Labs Reviewed: K+: 5.0 (06/19/2009) Creat: : 1.6 (06/19/2009)   Chol: 153 (06/19/2009)   HDL: 49.60 (06/19/2009)   LDL: 83 (06/19/2009)   TG: 102.0 (06/19/2009) stable overall by hx and exam, ok to continue meds/tx as is , no need for med tx at this time  Problem # 6:  GLUCOSE  INTOLERANCE (ICD-271.3) stable overall by hx and exam, ok to continue meds/tx as is  - no need for OHA at this time  Complete Medication List: 1)  Prilosec Otc 20 Mg Tbec (Omeprazole magnesium) .... Take 2 tablet by mouth once a day 2)  Oxycodone Hcl 5 Mg Tabs (Oxycodone hcl) .Marland Kitchen.. 1 - 3 by mouth every 6 hrs as needed for pain 3)  Ecotrin Low Strength 81 Mg Tbec (Aspirin) .Marland Kitchen.. 1 by mouth qd 4)  Combivent 103-18 Mcg/act Aero (Ipratropium-albuterol) .... 2 puffs four time per day as needed 5)  Pravachol 40 Mg Tabs (Pravastatin sodium) .Marland Kitchen.. 1po once daily 6)  Multivitamins Tabs (Multiple vitamin) .Marland Kitchen.. 1 by mouth qd  Other Orders: Administration Flu vaccine - MCR (G0008) Flu Vaccine 64yrs + MEDICARE PATIENTS (N5621) T-Vitamin D (25-Hydroxy) (30865-78469)  Patient Instructions: 1)  you had the flu shot today 2)  Continue all previous medications as before this visit  3)  Please go to the Lab in the basement for your blood and/or urine tests today 4)  Please call the number on the [facility] Card for results of your testing  5)  Please schedule a follow-up appointment in 6 months. Prescriptions: PRAVACHOL 40 MG TABS (PRAVASTATIN SODIUM) 1po once daily  #90 x 3   Entered and Authorized by:   Corwin Levins MD   Signed by:   Corwin Levins MD on 06/17/2010   Method used:   Print then Give to Patient   RxID:   [id number] COMBIVENT 103-18 MCG/ACT AERO (IPRATROPIUM-ALBUTEROL) 2 puffs four time per day as needed  #1 x 11   Entered and Authorized by:   Corwin Levins MD   Signed by:   Corwin Levins MD on 06/17/2010   Method used:   Print then Give to Patient   RxID:   [id number] OXYCODONE HCL 5 MG TABS (OXYCODONE HCL) 1 - 3 by mouth every 6 hrs as needed for pain  #100 x 0   Entered and Authorized by:   Corwin Levins MD   Signed by:   Corwin Levins MD on 06/17/2010   Method used:   Print then Give to Patient   RxID:   [id number] PRILOSEC OTC 20 MG TBEC (OMEPRAZOLE MAGNESIUM) Take 2 tablet  by mouth once a day  #180 x 3   Entered and Authorized by:   Corwin Levins MD   Signed by:  Corwin Levins MD on 06/17/2010   Method used:   Print then Give to Patient   RxID:   [id number]    Orders Added: 1)  Administration Flu vaccine - MCR [G0008] 2)  Flu Vaccine 60yrs + MEDICARE PATIENTS [Q2039] 3)  T-Vitamin D (25-Hydroxy) [14782-95621] 4)  TLB-BMP (Basic Metabolic Panel-BMET) [80048-METABOL] 5)  TLB-CBC Platelet - w/Differential [85025-CBCD] 6)  TLB-Hepatic/Liver Function Pnl [80076-HEPATIC] 7)  TLB-TSH (Thyroid Stimulating Hormone) [84443-TSH] 8)  TLB-Lipid Panel [80061-LIPID] 9)  Est. Patient Level IV [30865]   Immunizations Administered:  Influenza Vaccine # 1:    Vaccine Type: Fluvax 3+    Site: left deltoid    Mfr: Sanofi Pasteur    Dose: 0.5 ml    Route: IM    Given by: Zella Ball Ewing CMA (AAMA)    Exp. Date: 01/25/2011    Lot #: [id number]    VIS given: 02/19/10 version given June 17, 2010.  Flu Vaccine Consent Questions:    Do you have a history of severe allergic reactions to this vaccine? no    Any prior history of allergic reactions to egg and/or gelatin? no    Do you have a sensitivity to the preservative Thimersol? no    Do you have a past history of Guillan-Barre Syndrome? no    Do you currently have an acute febrile illness? no    Have you ever had a severe reaction to latex? no    Vaccine information given and explained to patient? yes   Immunizations Administered:  Influenza Vaccine # 1:    Vaccine Type: Fluvax 3+    Site: left deltoid    Mfr: Sanofi Pasteur    Dose: 0.5 ml    Route: IM    Given by: Zella Ball Ewing CMA (AAMA)    Exp. Date: 01/25/2011    Lot #: [id number]    VIS given: 02/19/10 version given June 17, 2010.

## 2010-08-27 NOTE — Progress Notes (Signed)
Summary: Referral  Phone Note Call from Patient Call back at Home Phone [phone]   Caller: Patient Summary of Call: pt called requesting re-referral to Dr.Deveschwar. Pt states that he has spoken with Dr's office and appt was cancelled but he did not know why. I explained to pt that this was per his request during OV b/c of decreased pain. pt denied and again asked for appt. Initial call taken by: Margaret Pyle, CMA,  August 21, 2009 4:25 PM  Follow-up for Phone Call        ok for the referral - I will re-do per pt request Follow-up by: Corwin Levins MD,  August 21, 2009 5:21 PM

## 2010-08-27 NOTE — Assessment & Plan Note (Signed)
Summary: back pain/#/cd   Vital Signs:  Patient profile:   77 year old male Height:      71 inches Weight:      130.75 pounds BMI:     18.30 O2 Sat:      96 % on Room air Temp:     98.5 degrees F oral Pulse rate:   82 / minute BP sitting:   150 / 60  (left arm)  Vitals Entered By: Lucious Groves (August 09, 2009 2:26 PM)  O2 Flow:  Room air CC: C/O severe back pain since fall on Sunday./kb Is Patient Diabetic? No Pain Assessment Patient in pain? yes     Location: back Intensity: 10 Type: severe Onset of pain  Sunday--after a fall   CC:  C/O severe back pain since fall on Sunday./kb.  History of Present Illness: fell last sunday (4 days ago)  with pain persistent to the lower back , severe, has chronic balance problem and was walking with flashlight in the back yard and tripped and fell backwards to buttocks and back or the head - no LOC;  no further headache and Pt denies new neuro symptoms such as headache, facial or extremity weakness No fever, ninght sweats, wt loss, bowel or bladder changes except constipated since fallen it seems b/c of increased pain to strain at stool so avoid s this;  has some LE pain on the right to the ant thigh only when the pain is aggrevated; o/w no icnreased pain, weakness or numbness to the LE's ; no further falls since the orginal fall,  no onther injury except for abrasion to the left arm - no significant bleeding. and bruise to the elbow.  Tried to see ortho but could not be seen per dr Madelon Lips - so he is here.  paoin over severe, worse to stand up or sit down.  Pt denies CP, sob, doe, wheezing, orthopnea, pnd, worsening LE edema, palps, dizziness or syncope   Problems Prior to Update: 1)  Compression Fracture, Lumbar Vertebrae  (ICD-805.4) 2)  Constipation  (ICD-564.00) 3)  Back Pain  (ICD-724.5) 4)  Carotid Artery Disease  (ICD-433.10) 5)  Osteoarthritis, Knees, Bilateral  (ICD-715.96) 6)  Unspecified Sudden Hearing Loss  (ICD-388.2) 7)   Renal Insufficiency  (ICD-588.9) 8)  COPD  (ICD-496) 9)  Vitamin D Deficiency  (ICD-268.9) 10)  Psa, Increased  (ICD-790.93) 11)  Echocardiogram, Abnormal  (ICD-793.2) 12)  Dyspnea On Exertion  (ICD-786.09) 13)  Bradycardia  (ICD-427.89) 14)  Special Screening Malignant Neoplasm of Prostate  (ICD-V76.44) 15)  Fatigue  (ICD-780.79) 16)  Rash-nonvesicular  (ICD-782.1) 17)  Family History Diabetes 1st Degree Relative  (ICD-V18.0) 18)  Alcoholic Cirrhosis of Liver  (EAV-409.8) 19)  Abuse, Alcohol, in Remission  (ICD-305.03) 20)  Osteoporosis  (ICD-733.00) 21)  Peptic Ulcer Disease  (ICD-533.90) 22)  Diverticulosis, Colon  (ICD-562.10) 23)  Colonic Polyps, Hx of  (ICD-V12.72) 24)  Bladder Cancer  (ICD-188.9) 25)  Glucose Intolerance  (ICD-271.3) 26)  Hyperlipidemia  (ICD-272.4) 27)  Hypertension  (ICD-401.9) 28)  Gerd  (ICD-530.81)  Medications Prior to Update: 1)  Prilosec Otc 20 Mg Tbec (Omeprazole Magnesium) .... Take 2 Tablet By Mouth Once A Day 2)  Tramadol Hcl 50 Mg Tabs (Tramadol Hcl) .Marland Kitchen.. 1 By Mouth Two Times A Day As Needed 3)  Ecotrin Low Strength 81 Mg  Tbec (Aspirin) .Marland Kitchen.. 1 By Mouth Qd 4)  Combivent 103-18 Mcg/act Aero (Ipratropium-Albuterol) .... 2 Puffs Four Time Per Day As Needed 5)  Pravachol 40 Mg Tabs (Pravastatin Sodium) .Marland Kitchen.. 1po Once Daily 6)  Multivitamins  Tabs (Multiple Vitamin) .Marland Kitchen.. 1 By Mouth Qd 7)  Ciprofloxacin Hcl 500 Mg Tabs (Ciprofloxacin Hcl) .Marland Kitchen.. 1 By Mouth Two Times A Day  Current Medications (verified): 1)  Prilosec Otc 20 Mg Tbec (Omeprazole Magnesium) .... Take 2 Tablet By Mouth Once A Day 2)  Oxycodone Hcl 5 Mg Tabs (Oxycodone Hcl) .Marland Kitchen.. 1 - 3 By Mouth Every 6 Hrs As Needed For Pain 3)  Ecotrin Low Strength 81 Mg  Tbec (Aspirin) .Marland Kitchen.. 1 By Mouth Qd 4)  Combivent 103-18 Mcg/act Aero (Ipratropium-Albuterol) .... 2 Puffs Four Time Per Day As Needed 5)  Pravachol 40 Mg Tabs (Pravastatin Sodium) .Marland Kitchen.. 1po Once Daily 6)  Multivitamins  Tabs (Multiple  Vitamin) .Marland Kitchen.. 1 By Mouth Qd  Allergies (verified): 1)  ! Augmentin (Amoxicillin-Pot Clavulanate) 2)  ! Ibuprofen (Ibuprofen)  Past History:  Past Medical History: Last updated: 09/18/2008 GERD Hypertension Hyperlipidemia glucose intolerance bladder cancer Colonic polyps, hx of hx of duodenal adenoma Diverticulosis, colon Peptic ulcer disease PMR Osteoporosis s/p left femur fx 1988, walks with cane ETOH dependence in remission mild hepatic cirrhosis low vitamin D elevated PSA COPD Renal insufficiency  Past Surgical History: Last updated: 06/20/2008 s/p small bowel obstruction surgury 12/01 s/p bilroth II Cholecystectomy Lumbar laminectomy x 2 Inguinal herniorrhaphy ds/p left middle ear surgury - dr Ann Maki - late 1980's  Social History: Last updated: 06/20/2008 Former Smoker Alcohol use-no Married 2 children 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   Head:  normocephalic and atraumatic.   Eyes:  vision grossly intact, pupils equal, and pupils round.   Ears:  R ear normal and ear piercing(s) noted.   Nose:  no external deformity and no nasal discharge.   Mouth:  no gingival abnormalities and pharynx pink and moist.   Neck:  supple and no masses.   Lungs:  normal respiratory effort, R decreased breath sounds, and L decreased breath sounds.   Heart:  normal rate and regular rhythm.   Abdomen:  soft, non-tender, and normal bowel sounds.   Msk:  no joint tenderness and no joint swelling.  , does have midline lumbar tender mild to mod upper to mid lumbar tender and right lumbar paravertebral tender Extremities:  no edema, no erythema  Neurologic:  cranial nerves II-XII intact, strength normal in lower extremities, and sensation intact to light touch.     Impression &  Recommendations:  Problem # 1:  BACK PAIN (ICD-724.5)  His updated medication list for this problem includes:    Oxycodone Hcl 5 Mg Tabs (Oxycodone hcl) .Marland Kitchen... 1 - 3 by mouth every 6 hrs as needed for pain    Ecotrin Low Strength 81 Mg Tbec (Aspirin) .Marland Kitchen... 1 by mouth qd diff includes fracture or new ruptured disc - for routine films today, and pain med; if fx might amenable to kyphoplasty; also for UTI with the pain and recent hx of UTI nov 2010  Orders: T-Culture, Urine ([id number]) TLB-Udip w/ Micro (81001-URINE) T-Lumbar Spine Complete, 5 Views (71110TC)  Problem # 2:  CONSTIPATION (ICD-564.00)  for dulcolox, miralax and /or colace on increaed pain med as above  Orders: T-Acute Abdomen (2 view w/ PA & Chest (30160FU)  Problem # 3:  HYPERTENSION (  ICD-401.9) mild elev today, likely situational, ok to follow, continue same treatment   BP today: 150/60 Prior BP: 136/60 (06/19/2009)  Labs Reviewed: K+: 5.0 (06/19/2009) Creat: : 1.6 (06/19/2009)   Chol: 153 (06/19/2009)   HDL: 49.60 (06/19/2009)   LDL: 83 (06/19/2009)   TG: 102.0 (06/19/2009)  Complete Medication List: 1)  Prilosec Otc 20 Mg Tbec (Omeprazole magnesium) .... Take 2 tablet by mouth once a day 2)  Oxycodone Hcl 5 Mg Tabs (Oxycodone hcl) .Marland Kitchen.. 1 - 3 by mouth every 6 hrs as needed for pain 3)  Ecotrin Low Strength 81 Mg Tbec (Aspirin) .Marland Kitchen.. 1 by mouth qd 4)  Combivent 103-18 Mcg/act Aero (Ipratropium-albuterol) .... 2 puffs four time per day as needed 5)  Pravachol 40 Mg Tabs (Pravastatin sodium) .Marland Kitchen.. 1po once daily 6)  Multivitamins Tabs (Multiple vitamin) .Marland Kitchen.. 1 by mouth qd  Patient Instructions: 1)  Please go to Radiology in the basement level for your X-Ray today 2)  Please go to the Lab in the basement for your urine tests today   3)  stop the tramadol for pain for now 4)  Please take all new medications as prescribed  - the stronger pain medication 5)  you should also take Colace (OTC) stool softner twice per  day for the current constipation 6)  you can also take Dulcolox as needed for the constipation, or miralax daily for the constipation as well 7)  Continue all previous medications as before this visit  8)  Please schedule a follow-up appointment as recommended at your last offce visit Prescriptions: OXYCODONE HCL 5 MG TABS (OXYCODONE HCL) 1 - 3 by mouth every 6 hrs as needed for pain  #100 x 0   Entered and Authorized by:   Corwin Levins MD   Signed by:   Corwin Levins MD on 08/09/2009   Method used:   Print then Give to Patient   RxID:   [id number]   Preventive Care Screening  Last Flu Shot:    Date:  04/27/2009    Results:  Historical

## 2010-08-27 NOTE — Assessment & Plan Note (Signed)
Summary: 6 MO ROV /NWS  #    Vital Signs:  Patient profile:   77 year old male Height:      72 inches Weight:      126.50 pounds BMI:     17.22 O2 Sat:      97 % on Room air Temp:     97.8 degrees F oral Pulse rate:   57 / minute BP sitting:   142 / 70  (left arm) Cuff size:   regular  Vitals Entered ByZella Ball Ewing (Dec 17, 2009 2:32 PM)  O2 Flow:  Room air  CC: 6 month ROV/RE   CC:  6 month ROV/RE.  History of Present Illness: here to f/u - kyphoplasty helped quite a bit - overall pain improved, but due for med refill with chronic pain med; still with fatigue but no osa symptoms - legs get weak and still can only sit as well for 30 min due to pain lower back, feet tingle with walking as well but overall no change from nov 2010;  Pt denies CP, sob, doe, wheezing, orthopnea, pnd, worsening LE edema, palps, dizziness or syncope   Pt denies new neuro symptoms such as headache, facial or worsening extremity weakness   Here for wellness Diet: Heart Healthy or DM if diabetic Physical Activities: Sedentary Depression/mood screen: Negative Hearing: mild decreased left s/p surgury with 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 with parachute jump Visual Acuity: Grossly normal, gets exam yearly ADL's: Capable, has small assist from wife  Fall Risk: mild, no recent falls since jan 2011;  walks with cane Home Safety: Good Cognitive Impairment:  Gen appearance, affect, speech, memory, attention & motor skills grossly intact End-of-Life Planning: Advance directive - Full code/I agree   Problems Prior to Update: 1)  Low Back Pain, Chronic  (ICD-724.2) 2)  Compression Fracture, Lumbar Vertebrae  (ICD-805.4) 3)  Constipation  (ICD-564.00) 4)  Back Pain  (ICD-724.5) 5)  Carotid Artery Disease  (ICD-433.10) 6)  Osteoarthritis, Knees, Bilateral  (ICD-715.96) 7)  Unspecified Sudden Hearing Loss  (ICD-388.2) 8)  Renal Insufficiency  (ICD-588.9) 9)  COPD  (ICD-496) 10)  Vitamin D Deficiency   (ICD-268.9) 11)  Psa, Increased  (ICD-790.93) 12)  Echocardiogram, Abnormal  (ICD-793.2) 13)  Dyspnea On Exertion  (ICD-786.09) 14)  Bradycardia  (ICD-427.89) 15)  Special Screening Malignant Neoplasm of Prostate  (ICD-V76.44) 16)  Fatigue  (ICD-780.79) 17)  Rash-nonvesicular  (ICD-782.1) 18)  Family History Diabetes 1st Degree Relative  (ICD-V18.0) 19)  Alcoholic Cirrhosis of Liver  (ZOX-096.0) 20)  Abuse, Alcohol, in Remission  (ICD-305.03) 21)  Osteoporosis  (ICD-733.00) 22)  Peptic Ulcer Disease  (ICD-533.90) 23)  Diverticulosis, Colon  (ICD-562.10) 24)  Colonic Polyps, Hx of  (ICD-V12.72) 25)  Bladder Cancer  (ICD-188.9) 26)  Glucose Intolerance  (ICD-271.3) 27)  Hyperlipidemia  (ICD-272.4) 28)  Hypertension  (ICD-401.9) 29)  Gerd  (ICD-530.81)  Medications Prior to Update: 1)  Prilosec Otc 20 Mg Tbec (Omeprazole Magnesium) .... Take 2 Tablet By Mouth Once A Day 2)  Oxycodone Hcl 5 Mg Tabs (Oxycodone Hcl) .Marland Kitchen.. 1 - 3 By Mouth Every 6 Hrs As Needed For Pain -  Please Make Return Office Visit For Further Refills 3)  Ecotrin Low Strength 81 Mg  Tbec (Aspirin) .Marland Kitchen.. 1 By Mouth Qd 4)  Combivent 103-18 Mcg/act Aero (Ipratropium-Albuterol) .... 2 Puffs Four Time Per Day As Needed 5)  Pravachol 40 Mg Tabs (Pravastatin Sodium) .Marland Kitchen.. 1po Once Daily 6)  Multivitamins  Tabs (Multiple Vitamin) .Marland KitchenMarland KitchenMarland Kitchen  1 By Mouth Qd  Current Medications (verified): 1)  Prilosec Otc 20 Mg Tbec (Omeprazole Magnesium) .... Take 2 Tablet By Mouth Once A Day 2)  Oxycodone Hcl 5 Mg Tabs (Oxycodone Hcl) .Marland Kitchen.. 1 - 3 By Mouth Every 6 Hrs As Needed For Pain 3)  Ecotrin Low Strength 81 Mg  Tbec (Aspirin) .Marland Kitchen.. 1 By Mouth Qd 4)  Combivent 103-18 Mcg/act Aero (Ipratropium-Albuterol) .... 2 Puffs Four Time Per Day As Needed 5)  Pravachol 40 Mg Tabs (Pravastatin Sodium) .Marland Kitchen.. 1po Once Daily 6)  Multivitamins  Tabs (Multiple Vitamin) .Marland Kitchen.. 1 By Mouth Qd  Allergies (verified): 1)  ! Augmentin (Amoxicillin-Pot Clavulanate) 2)  !  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  Risk Factors: Smoking Status: quit (06/17/2007)  Past Medical History: GERD Hypertension Hyperlipidemia glucose intolerance bladder cancer Colonic polyps, hx of hx of duodenal adenoma Diverticulosis, colon Peptic ulcer disease PMR Osteoporosis s/p left femur fx 1988, walks with cane ETOH dependence in remission mild hepatic cirrhosis low vitamin D elevated PSA COPD Renal insufficiency chronic LBP  Review of Systems  The patient denies anorexia, fever, vision loss, decreased hearing, hoarseness, chest pain, syncope, dyspnea on exertion, peripheral edema, prolonged cough, headaches, hemoptysis, abdominal pain, melena, hematochezia, severe indigestion/heartburn, hematuria, muscle weakness, suspicious skin lesions, transient blindness, depression, unusual weight change, abnormal bleeding, enlarged lymph nodes, and angioedema.         all otherwise negative per pt -  denies polydipsia or polyuria  Physical Exam  General:  alert and underweight appearing.   Head:  normocephalic and atraumatic.   Eyes:  vision grossly intact, pupils equal, and pupils round.   Ears:  R ear normal and L ear normal.   Nose:  no external deformity and no nasal discharge.   Mouth:  no gingival abnormalities and pharynx pink and moist.   Neck:  supple and no masses.   Lungs:  normal respiratory effort and normal breath sounds.   Heart:  normal rate and regular rhythm.   Abdomen:  soft,  non-tender, and normal bowel sounds.   Msk:  no joint tenderness and no joint swelling, spine nontender today.   Extremities:  no edema, no erythema  Neurologic:  alert & oriented X3 and cranial nerves II-XII intact.  o/w not done in detail today   Impression & Recommendations:  Problem # 1:  LOW BACK PAIN, CHRONIC (ICD-724.2)  His updated medication list for this problem includes:    Oxycodone Hcl 5 Mg Tabs (Oxycodone hcl) .Marland Kitchen... 1 - 3 by mouth every 6 hrs as needed for pain    Ecotrin Low Strength 81 Mg Tbec (Aspirin) .Marland Kitchen... 1 by mouth qd and bilateral chronic knee pain; for pain med refill, stable overall by hx and exam, ok to continue meds/tx as is   Orders: Prescription Created Electronically [phone])  Problem # 2:  RENAL INSUFFICIENCY (ICD-588.9) noted last visit, declines further labs today, states will consider further labs next visit  Problem # 3:  HYPERTENSION (ICD-401.9)  BP today: 142/70 Prior BP: 140/62 (08/17/2009)  Labs Reviewed: K+: 5.0 (06/19/2009) Creat: : 1.6 (06/19/2009)  Chol: 153 (06/19/2009)   HDL: 49.60 (06/19/2009)   LDL: 83 (06/19/2009)   TG: 102.0 (06/19/2009) stable overall by hx and exam, ok to continue meds/tx as is , declines new BP med with goal < 130/85 given renal insufficiency  Problem # 4:  GLUCOSE INTOLERANCE (ICD-271.3) asympt, ok to follow for now, declines a1c today  Problem # 5:  Preventive Health Care (ICD-V70.0)  Overall doing well, age appropriate education and counseling updated and referral for appropriate preventive services done unless declined, immunizations up to date or declined, diet counseling done if overweight, urged to quit smoking if smokes , most recent labs reviewed and current ordered if appropriate, ecg reviewed or declined (interpretation per ECG scanned in the EMR if done); information regarding Medicare Prevention requirements given if appropriate; speciality referrals updated as appropriate   Orders: First annual  wellness visit with prevention plan  ([id number])  Complete Medication List: 1)  Prilosec Otc 20 Mg Tbec (Omeprazole magnesium) .... Take 2 tablet by mouth once a day 2)  Oxycodone Hcl 5 Mg Tabs (Oxycodone hcl) .Marland Kitchen.. 1 - 3 by mouth every 6 hrs as needed for pain 3)  Ecotrin Low Strength 81 Mg Tbec (Aspirin) .Marland Kitchen.. 1 by mouth qd 4)  Combivent 103-18 Mcg/act Aero (Ipratropium-albuterol) .... 2 puffs four time per day as needed 5)  Pravachol 40 Mg Tabs (Pravastatin sodium) .Marland Kitchen.. 1po once daily 6)  Multivitamins Tabs (Multiple vitamin) .Marland Kitchen.. 1 by mouth qd  Patient Instructions: 1)  your pravachol was sent to the pharmacy to continue for cholesterol 2)  Continue all other previous medications as before this visit , including the oxycodone for pain 3)  Please schedule a follow-up appointment in 6 months. Prescriptions: OXYCODONE HCL 5 MG TABS (OXYCODONE HCL) 1 - 3 by mouth every 6 hrs as needed for pain  #100 x 0   Entered and Authorized by:   Corwin Levins MD   Signed by:   Corwin Levins MD on 12/17/2009   Method used:   Print then Give to Patient   RxID:   [id number] PRAVACHOL 40 MG TABS (PRAVASTATIN SODIUM) 1po once daily  #90 x 3   Entered and Authorized by:   Corwin Levins MD   Signed by:   Corwin Levins MD on 12/17/2009   Method used:   Electronically to        [facility] [address] [phone]* (retail)       [address]       [place], [place]  [address]       Ph: [phone] or [phone]       Fax: [phone]   RxID:   [id number]

## 2010-08-27 NOTE — Assessment & Plan Note (Signed)
Summary: RESULTS/ PER TRIAGE /NWS   Vital Signs:  Patient profile:   77 year old male Height:      72 inches Weight:      126 pounds BMI:     17.15 O2 Sat:      96 % on Room air Temp:     96.9 degrees F oral Pulse rate:   71 / minute BP sitting:   140 / 62  (left arm) Cuff size:   regular  Vitals Entered ByZella Ball Ewing (August 17, 2009 9:41 AM)  O2 Flow:  Room air CC: discuss results/RE   CC:  discuss results/RE.  History of Present Illness: pt here to discuss recent  film results, has so far not wanted to address further;  pain very intesne and severe 10/10 at last visit per pt, now more like 6/10;  unable to do MRI due to metal;  looked up "Dr Corliss Skains" in the phone book and was very confused as the one he found is Mrs-Dr Deveschwar 0 local rheumatologist, and did not realize or find the husband-Dr Deveshwar who does Special procedures;  was very confused and wanted to discuss; Pt denies CP, sob, doe, wheezing, orthopnea, pnd, worsening LE edema, palps, dizziness or syncope   Pt denies new neuro symptoms such as headache, facial or extremity weakness .  no further falls, fever, cough or worsening bowel or bladder symtpoms.    Problems Prior to Update: 1)  Compression Fracture, Lumbar Vertebrae  (ICD-805.4) 2)  Constipation  (ICD-564.00) 3)  Back Pain  (ICD-724.5) 4)  Carotid Artery Disease  (ICD-433.10) 5)  Osteoarthritis, Knees, Bilateral  (ICD-715.96) 6)  Unspecified Sudden Hearing Loss  (ICD-388.2) 7)  Renal Insufficiency  (ICD-588.9) 8)  COPD  (ICD-496) 9)  Vitamin D Deficiency  (ICD-268.9) 10)  Psa, Increased  (ICD-790.93) 11)  Echocardiogram, Abnormal  (ICD-793.2) 12)  Dyspnea On Exertion  (ICD-786.09) 13)  Bradycardia  (ICD-427.89) 14)  Special Screening Malignant Neoplasm of Prostate  (ICD-V76.44) 15)  Fatigue  (ICD-780.79) 16)  Rash-nonvesicular  (ICD-782.1) 17)  Family History Diabetes 1st Degree Relative  (ICD-V18.0) 18)  Alcoholic Cirrhosis of Liver   (ICD-571.2) 19)  Abuse, Alcohol, in Remission  (ICD-305.03) 20)  Osteoporosis  (ICD-733.00) 21)  Peptic Ulcer Disease  (ICD-533.90) 22)  Diverticulosis, Colon  (ICD-562.10) 23)  Colonic Polyps, Hx of  (ICD-V12.72) 24)  Bladder Cancer  (ICD-188.9) 25)  Glucose Intolerance  (ICD-271.3) 26)  Hyperlipidemia  (ICD-272.4) 27)  Hypertension  (ICD-401.9) 28)  Gerd  (ICD-530.81)  Medications Prior to Update: 1)  Prilosec Otc 20 Mg Tbec (Omeprazole Magnesium) .... Take 2 Tablet By Mouth Once A Day 2)  Oxycodone Hcl 5 Mg Tabs (Oxycodone Hcl) .Marland Kitchen.. 1 - 3 By Mouth Every 6 Hrs As Needed For Pain 3)  Ecotrin Low Strength 81 Mg  Tbec (Aspirin) .Marland Kitchen.. 1 By Mouth Qd 4)  Combivent 103-18 Mcg/act Aero (Ipratropium-Albuterol) .... 2 Puffs Four Time Per Day As Needed 5)  Pravachol 40 Mg Tabs (Pravastatin Sodium) .Marland Kitchen.. 1po Once Daily 6)  Multivitamins  Tabs (Multiple Vitamin) .Marland Kitchen.. 1 By Mouth Qd  Current Medications (verified): 1)  Prilosec Otc 20 Mg Tbec (Omeprazole Magnesium) .... Take 2 Tablet By Mouth Once A Day 2)  Oxycodone Hcl 5 Mg Tabs (Oxycodone Hcl) .Marland Kitchen.. 1 - 3 By Mouth Every 6 Hrs As Needed For Pain 3)  Ecotrin Low Strength 81 Mg  Tbec (Aspirin) .Marland Kitchen.. 1 By Mouth Qd 4)  Combivent 103-18 Mcg/act Aero (Ipratropium-Albuterol) .... 2 Puffs  Four Time Per Day As Needed 5)  Pravachol 40 Mg Tabs (Pravastatin Sodium) .Marland Kitchen.. 1po Once Daily 6)  Multivitamins  Tabs (Multiple Vitamin) .Marland Kitchen.. 1 By Mouth Qd  Allergies (verified): 1)  ! Augmentin (Amoxicillin-Pot Clavulanate) 2)  ! Ibuprofen (Ibuprofen)  Past History:  Past Medical History: Last updated: 09/18/2008 GERD Hypertension Hyperlipidemia glucose intolerance bladder cancer Colonic polyps, hx of hx of duodenal adenoma Diverticulosis, colon Peptic ulcer disease PMR Osteoporosis s/p left femur fx 1988, walks with cane ETOH dependence in remission mild hepatic cirrhosis low vitamin D elevated PSA COPD Renal insufficiency  Past Surgical  History: Last updated: 06/20/2008 s/p small bowel obstruction surgury 12/01 s/p bilroth II Cholecystectomy Lumbar laminectomy x 2 Inguinal herniorrhaphy ds/p left middle ear surgury - dr Ann Maki - late 1980's  Social History: Last updated: 06/20/2008 Former Smoker Alcohol use-no Married 2 children 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   Head:  normocephalic and atraumatic.   Eyes:  vision grossly intact, pupils equal, and pupils round.   Ears:  R ear normal and L ear normal.   Nose:  no external deformity and no nasal discharge.   Mouth:  no gingival abnormalities and pharynx pink and moist.   Neck:  supple and no masses.   Lungs:  normal respiratory effort and normal breath sounds.   Heart:  normal rate and regular rhythm.   Abdomen:  soft, non-tender, and normal bowel sounds.   Msk:  mild upper lumbar midline tender without swelling or paravertebral swelling or tender Extremities:  no edema, no erythema  Neurologic:  alert & oriented X3 and strength normal in lower extremities.     Impression & Recommendations:  Problem # 1:  COMPRESSION FRACTURE, LUMBAR VERTEBRAE (ICD-805.4) d/w pt nature of problem, and option to further eval by referral to dr deveshwar/special procedures, but he currently declines as pain is improved so far and he has now new or worsening other symtpoms such as back or extremity pain;  he might reconsider and call next wk if he assents to referral but for now he will simply cont pain meds and consider options; declines UA or other eval at this time, or PT  Problem # 2:  CONSTIPATION (ICD-564.00) improved; cont as needed LOC  Problem # 3:  HYPERTENSION (ICD-401.9)  BP today: 140/62 Prior BP: 150/60 (08/09/2009)  Labs  Reviewed: K+: 5.0 (06/19/2009) Creat: : 1.6 (06/19/2009)   Chol: 153 (06/19/2009)   HDL: 49.60 (06/19/2009)   LDL: 83 (06/19/2009)   TG: 102.0 (06/19/2009) mild elev today, likely situational, ok to follow, continue same treatment , declines meds at this time  Complete Medication List: 1)  Prilosec Otc 20 Mg Tbec (Omeprazole magnesium) .... Take 2 tablet by mouth once a day 2)  Oxycodone Hcl 5 Mg Tabs (Oxycodone hcl) .Marland Kitchen.. 1 - 3 by mouth every 6 hrs as needed for pain 3)  Ecotrin Low Strength 81 Mg Tbec (Aspirin) .Marland Kitchen.. 1 by mouth qd 4)  Combivent 103-18 Mcg/act Aero (Ipratropium-albuterol) .... 2 puffs four time per day as needed 5)  Pravachol 40 Mg Tabs (Pravastatin sodium) .Marland Kitchen.. 1po once daily 6)  Multivitamins Tabs (Multiple vitamin) .Marland Kitchen.. 1 by mouth qd  Patient Instructions: 1)  Continue all previous medications as before this  visit  2)  Please call next Mon or Tues if you would like to do the procedure on the bone in the back called "kyphoplasty"  3)  Continue all previous medications as before this visit  - call if you need further pain medications 4)  Please schedule a follow-up appointment as needed.

## 2010-08-29 NOTE — Miscellaneous (Signed)
Summary: Orders Update  Clinical Lists Changes  Orders: Added new Test order of Carotid Duplex (Carotid Duplex) - Signed 

## 2010-09-19 ENCOUNTER — Telehealth: Payer: Self-pay | Admitting: Internal Medicine

## 2010-09-24 NOTE — Progress Notes (Signed)
Summary: RX Request  Phone Note Call from Patient Call back at Home Phone [phone]   Caller: Patient Call For: Corwin Levins MD Summary of Call: Pt is requesting refill for OXYCODONE HCL 5MG . Please call when it is done. Thanks Initial call taken by: Livingston Diones,  September 19, 2010 3:01 PM    Prescriptions: OXYCODONE HCL 5 MG TABS (OXYCODONE HCL) 1 - 3 by mouth every 6 hrs as needed for pain  #100 x 0   Entered and Authorized by:   Corwin Levins MD   Signed by:   Corwin Levins MD on 09/19/2010   Method used:   Print then Give to Patient   RxID:   [id number]  done hardcopy to LIM side B - dahlia  Corwin Levins MD  September 19, 2010 6:13 PM   Pt informed, Rx in cabinet for pt pick up Margaret Pyle, CMA  September 20, 2010 8:25 AM

## 2010-10-01 ENCOUNTER — Ambulatory Visit (HOSPITAL_COMMUNITY)
Admission: RE | Admit: 2010-10-01 | Discharge: 2010-10-01 | Disposition: A | Discharge status: Home / Self Care | Payer: Medicare Other | Source: Ambulatory Visit | Attending: Orthopedic Surgery | Admitting: Orthopedic Surgery

## 2010-10-01 DIAGNOSIS — R0989 Other specified symptoms and signs involving the circulatory and respiratory systems: Secondary | ICD-10-CM

## 2010-10-01 DIAGNOSIS — I6529 Occlusion and stenosis of unspecified carotid artery: Secondary | ICD-10-CM | POA: Insufficient documentation

## 2010-10-01 DIAGNOSIS — I739 Peripheral vascular disease, unspecified: Secondary | ICD-10-CM | POA: Insufficient documentation

## 2010-10-13 LAB — CBC
HCT: 35.2 % — ABNORMAL LOW (ref 39.0–52.0)
Hemoglobin: 12.4 g/dL — ABNORMAL LOW (ref 13.0–17.0)
MCHC: 35.2 g/dL (ref 30.0–36.0)
MCV: 99.2 fL (ref 78.0–100.0)
RBC: 3.55 MIL/uL — ABNORMAL LOW (ref 4.22–5.81)
WBC: 8.1 10*3/uL (ref 4.0–10.5)

## 2010-10-13 LAB — PROTIME-INR
INR: 1.04 (ref 0.00–1.49)
Prothrombin Time: 13.5 seconds (ref 11.6–15.2)

## 2010-10-13 LAB — BASIC METABOLIC PANEL
GFR calc non Af Amer: 47 mL/min — ABNORMAL LOW (ref >60)
Potassium: 4.4 mEq/L (ref 3.5–5.1)

## 2010-11-12 ENCOUNTER — Encounter (INDEPENDENT_AMBULATORY_CARE_PROVIDER_SITE_OTHER): Payer: Medicare Other | Admitting: Vascular Surgery

## 2010-11-12 DIAGNOSIS — I7389 Other specified peripheral vascular diseases: Secondary | ICD-10-CM

## 2010-11-12 NOTE — Consult Note (Signed)
NEW PATIENT CONSULTATION  Sean [name], Sean [name] DOB:  06-15-1925                                       11/12/2010 UEAVW#:[id number]  Patient is an 77 year old male referred by Dr. Madelon Lips for circulation issues in both lower extremities.  This patient states that for many years he has had a burning, numb sensation in both legs, right slightly worse than left, that involves his lower legs and feet.  He has had no history of cellulitis, infection, gangrene, or true rest pain.  His biggest difficulty with ambulation is related to both knees.  His right fifth toe hurts when he is lying in bed on it, but if he turns in the other direction, this relieves the discomfort.  CHRONIC MEDICAL PROBLEMS: 1. Hyperlipidemia. 2. He has degenerative disk disease with previous lumbar     laminectomies. 3. Previous gastric procedures in the past, possibly for ulcers. 4. History of double hernia. 5. Negative for diabetes, hypertension, coronary artery disease, COPD,     or stroke.  SOCIAL HISTORY:  He is married, he has 2 children.  Has not smoked since 2005.  Does not use alcohol.  FAMILY HISTORY:  Negative for coronary artery disease, diabetes, or stroke.  REVIEW OF SYSTEMS:  Positive for dizziness, joint pain, muscle pain, reflux esophagitis, urinary frequency, instability with gait at times. All other systems are negative in complete review of systems.  PHYSICAL EXAMINATION:  Blood pressure 157/45, heart rate 75, respirations 18.  General:  He is an elderly, frail-appearing male who is no apparent distress, alert and oriented x3.  HEENT:  Normal for age. EOMs intact.  Lungs:  Clear to auscultation.  No rhonchi or wheezing. Cardiovascular:  Regular rhythm.  No murmurs.  Carotid pulses are 3+. There is harsh bruit on the right.  No bruit on the left.  Abdomen is soft, nontender with no masses.  Musculoskeletal exam is free of major deformities.  Neurologic:  Normal.  Skin exam  reveals no evidence of rashes or infection.  Lower extremity exam reveals 3+ femoral pulses bilaterally.  There are no popliteal or distal pulses.  I reviewed the lower extremity arterial Doppler study performed at [facility] on October 01, 2010.  ABI on the right leg is 0.61.  The left leg is 0.6.  This patient likely has superficial femoral occlusions bilaterally with some tibial disease with certainly adequate ABIs.  I think he has peripheral neuropathy causing his burning and numbness in both feet. There is no indication for revascularization of his lower extremities unless he develops limb-threatening symptoms or skin changes.  He states that his carotid duplex study has been done and has been followed by Dr. Oliver Barre and was repeated in December, the results of this study we do not have today.  We will not repeat that since it was just done, and Dr. Jonny Ruiz is following it.  He will return to see [place] on a p.r.n. basis.    Quita Skye Hart [place], M.D. Electronically Signed  JDL/MEDQ  D:  11/12/2010  T:  11/12/2010  Job:  [id number]  cc:   Corwin Levins, MD Dyke Brackett, M.D.

## 2010-12-10 NOTE — Assessment & Plan Note (Signed)
[facility]                            CARDIOLOGY OFFICE NOTE   Sean [name], Sean [name] AVERILL [name]                      MRN:          [id number]  DATE:07/11/2008                            DOB:          March 15, 1925    Sean [name] is a pleasant 83-year patient referred by Dr. Jonny [name] for  dyspnea and abnormal echo.   The patient is a previous long-time smoker.  He quit 5 years ago, but  has over 60-pack-year history of smoking.  He has had some exertional  dyspnea.  He does not really have significant chest pain.  However, his  dyspnea only occurs with exertion.  He has limited ability to exercise  due to bilateral lower extremity weakness from arthritis and previous  knee problems.  His echocardiogram showed a question of regional wall  motion abnormality, inferior wall with an EF of 50% with mild AR and  mild MR.   When I talked to the patient, he has never had a workup for coronary  artery disease.   His dyspnea has been little bit progressive over the last 2 months.   He has not had a significant cough.  He says that Dr. Jonny [name] gave him an  inhaler which he really has not used yet.  He said he may have had a  touch of asthma.   I suspect the patient clinically has emphysema from his long-standing  smoking.   He has not had any significant cough or sputum production.  There have  been no fevers.  I questioned closely regarding chest pain and he has  not had any of this.   REVIEW OF SYSTEMS:  Otherwise remarkable for previous history of a heart  murmur, question of previous irregular pulse and palpitations, some  dizziness, and a sensation that he can hear is pulse in his ears.   PAST MEDICAL HISTORY:  Remarkable for previous right knee surgery.  He  has had abdominal surgery for previous ulcer.  He is a previous long-  standing smoker with likely COPD.   ALLERGIES:  He is allergic to AUGMENTIN and IBUPROFEN.   He currently only takes Prilosec and  multivitamins.   FAMILY HISTORY:  Noncontributory.  He is happily married.  He does all  his activities of daily living and continues to drive.  He was  previously in the [employer] station at [employer].  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  He quit smoking  5 years ago.  He does not drink.   PHYSICAL EXAMINATION:  GENERAL:  Remarkable for somewhat frail elderly  male in no distress.  VITAL SIGNS:  Blood pressure is 140/70, pulse 65 and regular,  respiratory rate 14, and afebrile.  Weight is 137.  HEENT:  Unremarkable.  NECK:  He has bilateral carotid bruits.  No lymphadenopathy,  thyromegaly, or JVP elevation.  LUNGS:  Clear.  Good diaphragmatic motion.  No wheezing.  CARDIAC:  S1 and S2 with a systolic ejection murmur.  PMI  normal.  ABDOMEN:  Benign.  Bowel sounds positive.  No AAA.  No bruit.  No  hepatosplenomegaly.  No hepatojugular reflux, status post multiple  surgeries for ulcers.  EXTREMITIES:  Distal pulses are intact.  No edema.  NEUROLOGIC:  Nonfocal.  SKIN:  Warm and dry.  MUSCULOSKELETAL:  No muscular weakness.   EKG is normal.   IMPRESSION:  1. Dyspnea in the setting of carotid bruits with possible regional      wall motion abnormality on echo.  The patient will have a followup      adenosine Myoview.  2. Previous 60-pack-year smoking history with likely chronic      obstructive pulmonary disease.  I suspect Dr. Jonny [name] has done PFTs.      The patient was encouraged to use his inhaler on a regular basis      and have a yearly chest x-rays.  3. Arthritis.  Continue p.r.n. anti-inflammatories, although these      will have to be tempered, given his history of ulcer disease.  4. Previous ulcer surgery, currently not having significant melena.      Continue Prilosec.  5. Bilateral carotid bruits.  Further reason for doing a stress test      is there is evidence  of vascular disease in this long-term smoker      who is very elderly.  His murmurs in his neck are much louder than      his chest and they would appear to be primarily coming from his      carotids.  We will do a carotid duplex scan to rule out significant      disease here.  He has not had any transient ischemic attacks, given      his history of ulcers.  We will not institute aspirin at this time.   Further recommendation will be based on the results of his adenosine  Myoview and carotid duplex.     Sean [name]. Sean Emms, MD, [facility]  Electronically Signed    PCN/MedQ  DD: 07/11/2008  DT: 07/11/2008  Job #: [id number]

## 2010-12-13 ENCOUNTER — Encounter: Payer: Self-pay | Admitting: Internal Medicine

## 2010-12-13 DIAGNOSIS — Z0000 Encounter for general adult medical examination without abnormal findings: Secondary | ICD-10-CM | POA: Insufficient documentation

## 2010-12-13 DIAGNOSIS — R7302 Impaired glucose tolerance (oral): Secondary | ICD-10-CM

## 2010-12-13 HISTORY — DX: Impaired glucose tolerance (oral): R73.02

## 2010-12-13 NOTE — Op Note (Signed)
[facility]  Patient:    SAYER, MASINI                      MRN: [id number] Proc. Date: 07/04/00 Adm. Date:  [id number] Attending:  Brandy Hale CC:         Corwin Levins, M.D. [facility] Darrelyn Hillock, M.D. [facility] Koch., M.D. [facility]   Operative Report  PREOPERATIVE DIAGNOSES: 1. Mechanical small bowel obstruction. 2. Malnutrition.  POSTOPERATIVE DIAGNOSES: 1. Mechanical small bowel obstruction, secondary to adhesions. 2. Malnutrition.  OPERATION PERFORMED:  Exploratory laparotomy, with lysis of adhesions. Insertion of right internal jugular triple-lumen central venous catheter.  SURGEON:  Angelia Mould. Derrell Lolling, M.D.  FIRST ASSISTANT:  Thornton Park. Daphine Deutscher, M.D.  OPERATIVE INDICATIONS:  This is a 77 year old white man who has had multiple previous surgeries, including a vagotomy and pyloroplasty, and later on a partial gastrectomy.  He underwent laparoscopic cholecystectomy on May 19, 2000.  Postoperatively he had ERCP, upper endoscopy and removal of a duodenal polyp (which was benign).  For the past two weeks he has actually been sick with abdominal cramps, nausea and vomiting and diminished bowel movements.  He had to be admitted to this hospital on June 30, 2000 with clinical and radiographic findings of uncomplicated partial small bowel obstruction.  He initially improved somewhat with nasogastric suction, but remains distended and continues to have some cramps.  X-rays show persistent high-grade partial small bowel obstruction.  He is brought to the operating room for relief of his small bowel obstruction and insertion of a central catheter to initiate hyperalimentation due to his malnutrition.  OPERATIVE FINDINGS:  The patient had small bowel obstruction, secondary to several adhesions in the central lower abdomen.  Several loops of small bowel were distally adherent, and there were several bands binding these  into a ball of small intestine.  The intestine was viable; there was no ischemia, there was no gangrene.  There was some exudate and inflammatory change around some of the adhesions, but the intestine itself was perfectly healthy.  The intestine was markedly dilated proximal to these adhesions, and completely collapsed distal to the adhesions.  The intestine was examined from the ligament of Treitz all the way to the ileocecal valve; there was no other intrinsic abnormality.  The cecum and appendix and colon felt normal.  There essentially was no omentum present.  There were a lot of adhesions of the upper abdomen, which were soft and chronic; so, I could not really feel the spleen or liver.  The bladder felt normal and was collapsed due to the Foley catheter.  OPERATIVE TECHNIQUE:  Following the induction of general endotracheal anesthesia, the patients abdomen was prepped and draped in a sterile fashion. A midline laparotomy was performed in the lower midline below the umbilicus. The abdomen was entered and explored, with findings as described above.  We very carefully untangled a number of adhesions, which were causing obstruction in the mid to distal small bowel.  After we had taken all of these adhesions down and had separated the loops of bowel, we examined the bowel thoroughly (as described above).  There was no ischemia or perforation.  There was no evidence of tumor, and no indication for any resection.  Hemostasis was excellent.  We irrigated the abdomen with 2 L of saline.  The bowel was returned to its anatomic position.  The midline fascia was closed with a running suture of #1  PDS, and the skin closed with skin staples.  Clean bandages were placed.  Dr. Almeta Monas attempted to perform a right internal jugular venipuncture, but injured the carotid artery; ______ ______.  Following the surgery the patient was positioned with his arms at his side, and I prepped the neck and  right subclavian area.  The right subclavian venipuncture was attempted.  I made two passes but got no blood.  I then performed a right internal jugular venipuncture, which went easily.  I inserted the guide wire into the central venous circulation, and then passed a triple-lumen catheter over the guide wire (which had been flushed with heparin).  The catheter was positioned.  Had excellent blood return from the catheter, and the catheter was then flushed and capped off.  The catheter was sutured in place with silk sutures, and a clean occlusive bandage was placed.  The patient tolerated these procedures well.  Estimated blood loss was about 100-150 cc.  Complications were none.  Sponge and instrument counts were correct.DD:  07/04/00 TD:  07/05/00 Job: [id number]

## 2010-12-13 NOTE — Op Note (Signed)
[facility]  Patient:    Sean [name], Sean [name] Visit Number: [id number] MRN: [id number]          Service Type: NES Location: [facility] Attending Physician:  Thermon Leyland Dictated by:   Barron Alvine, M.D. Admit Date:  02/14/2002 Discharge Date: 02/14/2002                             Operative Report  PREOPERATIVE DIAGNOSIS:  Recurrent transitional cell carcinoma of the bladder.  POSTOPERATIVE DIAGNOSIS:  Recurrent transitional cell carcinoma of the bladder.  PROCEDURES: 1. Cystoscopy. 2. Bladder biopsy x5 with fulguration. 3. Installation of mitomycin C.  SURGEON:  Barron Alvine, M.D.  ANESTHESIA:  General.  INDICATIONS:  The patient is a 77 year old male. He has had two transitional cell carcinomas of his bladder. One was on the lateral wall and one was on the bladder neck. His most recent tumor occurred about nine months ago. He has been doing well but under recent follow up cystoscopy, he was noted to have several tumors. We saw three at the time of flexible cystoscopy in the office but subsequently found more today on cystoscopy. His tumors have been superficial and well differentiated. He presents now for resection/fulguration of these lesions.  TECHNIQUE AND FINDINGS:  The patient was brought to the operating room where he had successful induction of general anesthesia. He was then placed in lithotomy position, prepped and draped in the usual manner. We performed very careful cystoscopy with the 12 degree and 70 degree scopes. We counted six separate tumors. The first was on the right posterior bladder wall near the junction of the lateral wall. He had two very close tumors on the left lateral wall and two very close tumors on the left hemitrigone, superior to the ureter orifice. There was also a very small tumor at the bladder neck position at 3 oclock. The bladder neck tumor appeared to be difficulty to biopsy given its location.  Therefore, we used an Albarrans bridge and used fulguration for that tumor alone. The other tumors all were cold cup biopsied. They all measured between 5-10 mm in size, were all papillary and appeared to be well differentiated and superficial. The grouped lesions were sent together and therefore, we had three specimens. We did five cold cup biopsies, however. These areas were all fulgurated. There was no evidence of bladder perforation and hemostasis was excellent. At the completion of the fulguration, we placed a Foley catheter and again irrigated his bladder and found that his urine was clear. We then instilled 30 mg of mitomycin C and the Foley was clamped and will be left indwelling for approximately 30-45 minutes. We will then remove the Foley catheter. Dictated by:   Barron Alvine, M.D. Attending Physician:  Thermon Leyland DD:  02/14/02 TD:  02/17/02 Job: [id number]

## 2010-12-13 NOTE — Op Note (Signed)
[facility]  Patient:    Sean [name], Sean [name]                      MRN: [id number] Proc. Date: 12/14/00 Adm. Date:  [id number] Attending:  Thermon Leyland CC:         Sonda Primes, M.D. [facility]   Operative Report  PREOPERATIVE DIAGNOSES:  1. Gross hematuria.  2. Bladder tumor.  POSTOPERATIVE DIAGNOSES:  1. Gross hematuria.  2. Bladder tumor.  PROCEDURE:  Transurethral resection of bladder tumor.  SURGEON:  Dr. Isabel Caprice.  ANESTHESIA:  General.  INDICATIONS FOR PROCEDURE:  Mr. Aguas has no previous urologic history. He is a 77 year old male. He had some gross hematuria. He did not have a lot of other voiding or abdominal complaints at the time of his gross hematuria. He did have some white blood cells in his urine at that time and was given antibiotics with resolution of his hematuria. We, however, did not feel comfortable assigning an infectious etiology. 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 He presents now for resection of this tumor.  TECHNIQUE AND FINDINGS:  The patient was brought to the operating room where he had the successful induction of general anesthesia. He was placed in lithotomy position and prepped and draped in the usual manner. Cystoscopy revealed a fairly unremarkable prostatic urethra with mild trilobar hyperplasia. The patient had a 3-4 cm papillary transitional cell carcinoma right at the right lateral wall of his bladder. Pictures were taken to show the location of this tumor. It was very well differentiated and noninvasive in appearance. It was well removed from the right ureteral orifice. A 27 French continuous flow resectoscope sheath was utilized. The tumor was resected completely with some muscular tissue underneath the base of the tumor. Hemostasis was excellent. No evidence of bladder perforation occurred. The patient appeared to tolerate the procedure  very well. He was brought to the recovery room in stable condition. DD:  12/14/00 TD:  12/14/00 Job: [id number]

## 2010-12-13 NOTE — H&P (Signed)
[facility]  Patient:    Sean [name], Sean [name]                      MRN: [id number] Adm. Date:  [id number] Attending:  Stephenie Acres CC:         Corwin Levins, M.D. [facility] Darrelyn Hillock, M.D. [facility] T. Pleas Koch., M.D. [facility]   History and Physical  CHIEF COMPLAINT:  Crampy abdominal pain, nausea and vomiting and obstipation.  HISTORY OF PRESENT ILLNESS:  The patient is a 77 year old male who underwent a laparoscopic cholecystectomy with intraoperative cholangiogram, May 19, 2000.  Intraoperatively, he was noted to have a distal common bile duct filling defect but with flushing, this resolved.  He has had problems with constipation ever since his operation.  One week ago, he began having some crampy upper epigastric pain with nausea and vomiting; this was on June 23, 2000.  On June 24, 2000, he went for an EGD and polypectomy; it was a duodenal polyp that was benign.  He continued to have cramping abdominal pain, obstipation and nausea and vomiting.  He states that he has not had a bowel movement in a week.  Symptoms worsened and he presented to the office today and was seen by Dr. Maisie Fus B. Price and was sent over to [facility] for admission.  PAST MEDICAL HISTORY 1. Peptic ulcer disease. 2. Chronic calculous cholecystitis. 3. Tubulovillous adenoma of duodenum.  OPERATIVE PROCEDURES 1. Bilateral inguinal hernia repair. 2. Back surgery x 2. 3. Repair of tendons in right and left hands. 4. ORIF, left lower extremity fracture. 5. Laparoscopic cholecystectomy. 6. Vagotomy and partial gastrectomy.  ALLERGIES:  None reported.  CURRENT MEDICATIONS:  None.  SOCIAL HISTORY:  He smokes a pack of cigarettes a day and has been doing so for 60 years.  Denies alcohol use.  REVIEW OF SYSTEMS:  CARDIOVASCULAR:  Denies coronary artery disease, hypertension or dysrhythmia.  PULMONARY:  He denies asthma, COPD.  NEUROLOGIC: Denies  seizures or strokes.  HEMATOLOGIC:  He denies any anemia or bleeding disorder.  ENDOCRINE:  Denies diabetes or thyroid disease.  GU:  Denies any hematuria, incontinence or nocturia.  PHYSICAL EXAMINATION  GENERAL:  He is a very thin male who appears to be in no acute distress, very pleasant and cooperative.  VITAL SIGNS:  Temperature 97.5, blood pressure 158/78, pulse 77.  EYES:  Extraocular motions intact and sclerae clear.  NECK:  Very thin and cachectic but there are no palpable masses.  CARDIOVASCULAR:  Heart demonstrates a regular rate and rhythm without a murmur.  RESPIRATORY:  Breath sounds are distant but equal and clear.  ABDOMEN:  Firm and distended with mild diffuse tenderness.  There are palpable bowel loops present.  There is tympany.  There are intermittent high-pitched bowel sounds.  There are multiple scars but no herniae.  EXTREMITIES:  Cachectic with muscle wasting.  No edema.  LABORATORY AND X-RAY FINDINGS:  Abdominal x-rays demonstrate dilated loops of small bowel with air-fluid levels and also air into the colon and rectum.  Laboratory data remarkable for hemoglobin 13.6, white blood cell count 13,200, sodium 132, chloride 89, BUN 33, creatinine 1.6.  Alkaline phosphatase 213 and AST 44.  Lipase 14.  IMPRESSION:  Small-bowel obstruction versus ileus, favor former versus latter. He has had the partial gastrectomy in the past.  PLAN:  Admission with IV fluid hydration, nasogastric decompression to try to manage this medically.  We will recheck  his x-rays and laboratories in the morning. DD:  06/30/00 TD:  07/01/00 Job: [id number]

## 2010-12-13 NOTE — Op Note (Signed)
[facility]  Patient:    Sean [name], Sean [name] Visit Number: [id number] MRN: [id number]          Service Type: NES Location: [facility]C Attending Physician:  Thermon Leyland Dictated by:   Barron Alvine, M.D. Proc. 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  OPERATION PERFORMED:  Cystoscopy and transurethral resection of bladder tumor.  SURGEON:  Barron Alvine, M.D.  ANESTHESIA:  General with LMA.  INDICATIONS FOR PROCEDURE:  [name] is a 77 year old male.  Six months ago he had resection of a superficial papillary bladder tumor involving the right lateral wall of his bladder.  His initial follow-up cystoscopy was unremarkable.  He is doing well without symptoms.  Follow-up cystoscopy, however, revealed a recurrent tumor right at the 9 oclock position of his bladder neck and he presents now to resect this.  DESCRIPTION OF PROCEDURE:  The patient was brought to the operating room where he had successful induction of general anesthesia.  He was placed in lithotomy position and prepped and draped in the usual manner.  Cystoscopy revealed moderate trilobar hyperplasia with quite a vascular appearing prostate.  At the 9 oclock position there was a papillary tumor that appeared to be well differentiated and probably not invasive.  In order to adequately resect this, we felt that transurethral resection of bladder tumor of the bladder neck area was the most appropriate.  This tumor extended from about 9 oclock towards the 12 oclock position.  For that reason, the tumor along with some underlying bladder neck tissue was resected from the 12 to 9 oclock position.  I also took a few deeper bites of the bladder neck and took one bite right  at the 6 oclock position to open up his median bar and to try to decrease any obstruction he might have from any postoperative swelling or stricturing/scarring from the resection and cauterization of the bladder  neck in the 9 to 12 position.  The specimen was sent with superficial and deep bites done separately.  There was no evidence of other tumors or other suspicious areas.  We went ahead and placed a 22 French Foley catheter to straight drainage for approximately 48 hours.  He will be managed as an outpatient. Dictated by:   Barron Alvine, M.D. Attending Physician:  Thermon Leyland DD:  07/26/01 TD:  07/26/01 Job: [id number]

## 2010-12-13 NOTE — Discharge Summary (Signed)
[facility]  Patient:    Sean [name], Sean [name]                      MRN: [id number] Adm. Date:  [id number] Disc. Date: [id number] Attending:  Brandy Hale CC:         Corwin Levins, M.D. [facility] Darrelyn Hillock, M.D. [facility] T. Pleas Koch., M.D. [facility]   Discharge Summary  FINAL DIAGNOSES: 1. Small bowel obstruction secondary to adhesions. 2. Malnutrition. 3. Past history of partial gastrectomy for peptic ulcer disease. 4. Status post recent cholecystectomy. 5. History of villous adenoma of the duodenum. 6. History of bilateral inguinal hernia repair. 7. History of back surgery x 2.  OPERATIONS PERFORMED: 1. Exploratory laparotomy with lysis of adhesions. 2. Insertion of right internal jugular triple lumen catheter for    hyperalimentation.  HISTORY OF PRESENT ILLNESS:  This is a 77 year old man who underwent laparoscopic cholecystectomy with cholangiogram on May 19, 2000.  He was discharged after an uneventful postoperative course.  He has been constipated since his operation.  One week prior to admission he began to develop upper abdominal crampy pain with nausea and vomiting.  He underwent an upper endoscopy on June 24, 2000, and found a duodenal polyp that was removed, but he continued to have crampy upper abdominal pain, constipation, nausea and vomiting.  When he was admitted to the hospital by Dr. Abbey Chatters on June 30, 2000, he stated he had not had a bowel movement for one week.  For details of his past medical history, social history, review of systems, please see the detailed admission note.  PHYSICAL EXAMINATION:  GENERAL:  This is a very thin man who appeared to be in no distress, was very pleasant and cooperative.  VITAL SIGNS:  Temperature 97.5, blood pressure 158/78, pulse 77.  NECK:  Thin and cachectic, but not palpable masses.  HEART:  Regular rate and rhythm, no murmur.  LUNGS:  Breath sounds distant,  but clear.  ABDOMEN:  Firm, distended, mild diffuse tenderness.  Comparable bowel loops are present.  There is tympany.  There are intermittent high pitched bowel sounds.  There are multiple scars, but no hernia.  EXTREMITIES:  Cachectic with muscle wasting, no edema.  LABORATORY DATA:  Abdominal x-rays demonstrate dilated loops of small bowel with air fluid levels, and also some air in the colon and rectum.  Hemoglobin 13.6, white count 13,200, sodium 132, creatinine 1.6, BUN 33. Alkaline phosphatase 213, lipase 14.  HOSPITAL COURSE:  The patient was admitted with the diagnosis of small bowel obstruction versus ileus, favor obstruction.  Status post partial gastrectomy. A nasogastric tube was inserted for suction, and IV fluids were started.  Over the next 72 hours the patient was managed by Dr. Avel Peace and Dr. Jannifer Rodney.  The patient did not have much pain, and really had no fever, and his white count came back down, but his x-rays continued to show some partially obstructed small bowel.  On July 04, 2000, Dr. Luan Pulling asked me to see the patient and assume his care because she was going out of town.  I did so, and found that the patient continued to be obstructed clinically and radiographically, although there was no evidence of compromised bowel.  Also noted that he appeared to be malnourished.  I advised him and his family to undergo a laparotomy as well as hyperalimentation.  They agreed with this plan.  The patient was taken to  the operating room on July 04, 2000.  He was found to have a small bowel obstruction of the mid and small bowel secondary to adhesions of the mid small bowel bound down to the retroperitoneum in the mid to lower abdomen.  Once these adhesions were taken down, the obstruction was relieved.  Following the surgery, I placed a central venous catheter for hyperalimentation.  Postoperatively, the patient did relatively well.  He  was started on hyperalimentation which was managed throughout his hospital course.  He had an ileus for several days, as expected, and was deconditioned.  Along with hyperalimentation, we involved physical therapy and case management in his care.  He began physical therapy prior to removing his nasogastric tube.  On July 09, 2000, it was noted that he still had a somewhat distended abdomen, and x-rays were obtained which showed a colonic ileus and a small bowel obstruction that resolved.  It was also noted that his liver function tests were mildly elevated.  It was thought to be secondary to cholestasis related to his hyperalimentation.  Nevertheless, we did get an ultrasound and it showed that the bile ducts were not dilated.  The gallbladder had been previously removed.  It was not felt that he had obstructive jaundice.  We discontinued his nasogastric tube on July 10, 2000, after the nasogastric tube drainage dropped off.  He did well thereafter.  He progressed slowly, but steady in his diet and activities.  He was well enough to be discharged on July 14, 2000.  At the time of discharge, he was tolerating a regular diet, was having bowel movement, and was eating about 75% of the food brought to him.  He was not having any nausea, vomiting, or cramps.  His laboratory work showed a normal basic metabolic panel except for a BUN of 32.  His wound was healing well.  DISCHARGE MEDICATIONS: 1. Ensure supplements t.i.d. 2. Darvocet for pain.  Home health occupational therapy and home health physical therapy was offered, but the patient declined this.  FOLLOWUP:  He was asked to return to see me in the office in 10 to 14 days. DD:  08/10/00 TD:  08/10/00 Job: [id number]

## 2010-12-13 NOTE — Op Note (Signed)
[facility]  Patient:    Sean [name], Sean [name]                      MRN: [id number] Proc. Date: 05/19/00 Adm. Date:  [id number] Attending:  Justine Null CC:         Venita Lick. Pleas Koch., M.D. [facility] Darrelyn Hillock, M.D. [facility]  Corwin Levins, M.D. [facility]   Operative Report  PREOPERATIVE DIAGNOSIS:  Cholelithiasis and choledocholithiasis.  POSTOPERATIVE DIAGNOSIS:  Cholelithiasis and choledocholithiasis.  PROCEDURE:  Laparoscopic cholecystectomy with intraoperative cholangiogram.  SURGEON:  Catalina Lunger, M.D.  ASSISTANT:  Sharlet Salina T. Hoxworth, M.D.  ANESTHESIA:  General.  CONSULTING PHYSICIANS:  Malcolm T. Pleas Koch., M.D., Justine Null, M.D., and Corwin Levins, M.D.  DESCRIPTION OF PROCEDURE:  The patient was taken to the operating room and placed in the supine position.  After adequate anesthesia was induced using an endotracheal tube, the abdomen was prepped and draped in normal sterile fashion.  Using a lower infraumbilical vertical incision, I dissected down to the fascia.  The fascia was opened vertically.  An 0 Vicryl pursestring suture was placed around the fascial defect.  A Hasson trocar was placed in the abdomen, and the abdomen was insufflated with carbon dioxide.  Under direct visualization, two 5 mm ports were placed in the right abdomen.  A number of adhesions to the superior midline were taken down using sharp dissection. This was done under direct visualization.  At this point, a 10 mm port was placed at the xiphoid region under direct visualization.  The gallbladder was very edematous and quite large, was identified and retracted cephalad. Dissection began down in the infundibulum of the gallbladder.  There were a great deal of adhesions to the anterior surface of the gallbladder.  The duodenum was meticulously dissected free from the sidewall of the gallbladder as well.  After the gallbladder was  completely mobilized, the cystic duct was easily identified, clipped, and ductotomy was made.  Through it, a 14 gauge angiocather, a Reddick catheter, was passed down through the duct and cholangiogram was performed.  Initially high pressures were required, and there appeared to be a filling defect in the distal common duct.  After two or three attempts at further cholangiogram, pressure was decreased and the filling defect disappeared as it was flushed into the duodenum.  Repeat cholangiogram showed no filling defects and good filling of the common bile duct.  The catheter was removed.  The cystic duct was triply clipped and divided.  The cystic artery was identified, triply clipped, and divided.  The gallbladder, which was quite thin-walled, had a small rent made in it with a great deal of bile spillage.  It was aspirated, placed in an Endocatch bag after it was removed off the gallbladder bed.  It was removed through the umbilical port.  The right upper quadrant was copiously irrigated.  Adequate hemostasis was ensured.  Incisions were injected using Marcaine and closed with staples.  The patient tolerated the procedure well and went to PACU in good condition. DD:  05/19/00 TD:  05/20/00 Job: [id number]

## 2010-12-13 NOTE — Discharge Summary (Signed)
[facility]  Patient:    Sean [name], Sean [name]                      MRN: [id number] Adm. Date:  [id number] Disc. Date: [id number] Attending:  Brandy Hale CC:         Corwin Levins, M.D. [facility] Darrelyn Hillock, M.D. [facility] T. Pleas Koch., M.D. [facility]   Discharge Summary  FINAL DIAGNOSES: 1. Small bowel obstruction secondary to adhesions. 2. Malnutrition. 3. Past history of partial gastrectomy for peptic ulcer disease. 4. Status post recent cholecystectomy. 5. History of villous adenoma of the duodenum. 6. History of bilateral inguinal hernia repair. 7. History of back surgery x 2.  OPERATIONS PERFORMED: 1. Exploratory laparotomy with lysis of adhesions. 2. Insertion of right internal jugular triple lumen catheter for hyperalimentation.  HISTORY OF PRESENT ILLNESS:  This is a 76 year old man who underwent laparoscopic cholecystectomy with cholangiogram on May 19, 2000.  He was discharged after an uneventful postoperative course.  He has been constipated since his operation.  One week prior to admission he began to develop upper abdominal crampy pain with nausea and vomiting.  He underwent an upper endoscopy on June 24, 2000, and found a duodenal polyp that was removed, but he continued to have crampy upper abdominal pain, constipation, nausea and vomiting.  When he was admitted to the hospital by Dr. Abbey Chatters on June 30, 2000, he stated he had not had a bowel movement for one week.  For details of his past medical history, social history, review of systems, please see the detailed admission note.  PHYSICAL EXAMINATION:  GENERAL:  This is a very thin man who appeared to be in no distress, was very pleasant and cooperative.  VITAL SIGNS:  Temperature 97.5, blood pressure 158/78, pulse 77.  NECK:  Thin and cachectic, but not palpable masses.  HEART:  Regular rate and rhythm, no murmur.  LUNGS:  Breath sounds distant, but  clear.  ABDOMEN:  Firm, distended, mild diffuse tenderness.  Comparable bowel loops are present.  There is tympany.  There are intermittent high pitched bowel sounds.  There are multiple scars, but no hernia.  EXTREMITIES:  Cachectic with muscle wasting, no edema.  LABORATORY DATA:  Abdominal x-rays demonstrate dilated loops of small bowel with air fluid levels, and also some air in the colon and rectum.  Hemoglobin 13.6, white count 13,200, sodium 132, creatinine 1.6, BUN 33. Alkaline phosphatase 213, lipase 14.  HOSPITAL COURSE:  The patient was admitted with the diagnosis of small bowel obstruction versus ileus, favor obstruction.  Status post partial gastrectomy. A nasogastric tube was inserted for suction, and IV fluids were started.  Over the next 72 hours the patient was managed by Dr. Avel Peace and Dr. Jannifer Rodney.  The patient did not have much pain, and really had no fever, and his white count came back down, but his x-rays continued to show some partially obstructed small bowel.  On July 04, 2000, Dr. Luan Pulling asked me to see the patient and assume his care because she was going out of town.  I did so, and found that the patient continued to be obstructed clinically and radiographically, although there was no evidence of compromised bowel.  Also noted that he appeared to be malnourished.  I advised him and his family to undergo a laparotomy as well as hyperalimentation.  They agreed with this plan.  The patient was taken to the operating room  on July 04, 2000.  He was found to have a small bowel obstruction of the mid and small bowel secondary to adhesions of the mid small bowel bound down to the retroperitoneum in the mid to lower abdomen.  Once these adhesions were taken down, the obstruction was relieved.  Following the surgery, I placed a central venous catheter for hyperalimentation.  Postoperatively, the patient did relatively well.  He was  started on hyperalimentation which was managed throughout his hospital course.  He had an ileus for several days, as expected, and was deconditioned.  Along with hyperalimentation, we involved physical therapy and case management in his care.  He began physical therapy prior to removing his nasogastric tube.  On July 09, 2000, it was noted that he still had a somewhat distended abdomen, and x-rays were obtained which showed a colonic ileus and a small bowel obstruction that resolved.  It was also noted that his liver function tests were mildly elevated.  It was thought to be secondary to cholestasis related to his hyperalimentation.  Nevertheless, we did get an ultrasound and it showed that the bile ducts were not dilated.  The gallbladder had been previously removed.  It was not felt that he had obstructive jaundice.  We discontinued his nasogastric tube on July 10, 2000, after the nasogastric tube drainage dropped off.  He did well thereafter.  He progressed slowly, but steady in his diet and activities.  He was well enough to be discharged on July 14, 2000.  At the time of discharge, he was tolerating DD:  08/10/00 TD:  08/10/00 Job: [id number]

## 2010-12-16 ENCOUNTER — Ambulatory Visit (INDEPENDENT_AMBULATORY_CARE_PROVIDER_SITE_OTHER): Payer: Medicare Other | Admitting: Internal Medicine

## 2010-12-16 ENCOUNTER — Encounter: Payer: Self-pay | Admitting: Internal Medicine

## 2010-12-16 VITALS — BP 162/62 | HR 63 | Temp 98.6°F | Ht 72.0 in | Wt 137.2 lb

## 2010-12-16 DIAGNOSIS — G629 Polyneuropathy, unspecified: Secondary | ICD-10-CM

## 2010-12-16 DIAGNOSIS — R7309 Other abnormal glucose: Secondary | ICD-10-CM

## 2010-12-16 DIAGNOSIS — G609 Hereditary and idiopathic neuropathy, unspecified: Secondary | ICD-10-CM

## 2010-12-16 DIAGNOSIS — E785 Hyperlipidemia, unspecified: Secondary | ICD-10-CM

## 2010-12-16 DIAGNOSIS — I10 Essential (primary) hypertension: Secondary | ICD-10-CM

## 2010-12-16 DIAGNOSIS — R7302 Impaired glucose tolerance (oral): Secondary | ICD-10-CM

## 2010-12-16 MED ORDER — OXYCODONE HCL 5 MG PO TABS: 5.0000 mg | ORAL_TABLET | Freq: Four times a day (QID) | ORAL | Status: DC | PRN

## 2010-12-16 NOTE — Assessment & Plan Note (Signed)
stable overall by hx and exam, most recent lab reviewed with pt, and pt to continue medical treatment as before  Lab Results  Component Value Date   LDLCALC 81 06/17/2010

## 2010-12-16 NOTE — Assessment & Plan Note (Signed)
stable overall by hx and exam, most recent lab reviewed with pt, and pt to continue medical treatment as before  Lab Results  Component Value Date   HGBA1C 5.7 06/17/2010

## 2010-12-16 NOTE — Assessment & Plan Note (Signed)
Mild symptomatic, pt declines emg/ncs or other eval or tx at this time

## 2010-12-16 NOTE — Assessment & Plan Note (Addendum)
stable overall by hx and exam, most recent lab reviewed with pt, and pt to continue medical treatment as before ;  BP at home < 140/90 and is somewhat labile Lab Results  Component Value Date   WBC 8.2 06/17/2010   HGB 12.2* 06/17/2010   HCT 35.1* 06/17/2010   PLT 162.0 06/17/2010   CHOL 156 06/17/2010   TRIG 143.0 06/17/2010   HDL 46.50 06/17/2010   ALT 19 06/17/2010   AST 27 06/17/2010   NA 139 06/17/2010   K 4.4 06/17/2010   CL 103 06/17/2010   CREATININE 1.4 06/17/2010   BUN 33* 06/17/2010   CO2 29 06/17/2010   TSH 0.78 06/17/2010   PSA 5.15* 06/20/2008   INR 1.04 08/23/2009   HGBA1C 5.7 06/17/2010   BP Readings from Last 3 Encounters:  12/16/10 162/62  06/17/10 132/48  12/17/09 142/70

## 2010-12-16 NOTE — Progress Notes (Signed)
Subjective:    Patient ID: Sean [name], male    DOB: 08/21/1924, 77 y.o.   MRN: [id number]  HPI  Here to f/u; still having ongoing aches and pains to his feet and hands,  Had cortisone shot to lfet hand that helped;  Had art dopplers per ortho to legs, saw vascular after found to have arterial blockages/PVD;  Still having LE stinging/burning/pain, likely due to periph neuropathy.  Does not interfere with going to sleep, and overall mild, though does affect his walking and balance.  No recent falls but did have L3 compression fx requiring kyphoplasty jan 2011. Does not want further eval or tx at this time. Pt denies chest pain, increased sob or doe, wheezing, orthopnea, PND, increased LE swelling, palpitations, dizziness or syncope.  Pt denies new neurological symptoms such as new headache, or facial or extremity weakness or numbness except for above.   Pt denies polydipsia, polyuria,.  Pt states overall good compliance with meds, trying to follow lower cholesterol, diabetic diet, wt overall stable but little exercise however.   Walks with cane.   Uses the oxycodone for recurring back pain only., Pt continues to have recurring LBP without change in severity, bowel or bladder change, fever, wt loss,  worsening LE pain/numbness/weakness, gait change or falls.   Past Medical History  Diagnosis Date  . BLADDER CANCER 06/17/2007  . VITAMIN D DEFICIENCY 09/18/2008  . GLUCOSE INTOLERANCE 06/17/2007  . HYPERLIPIDEMIA 06/17/2007  . ABUSE, ALCOHOL, IN REMISSION 06/17/2007  . UNSPECIFIED SUDDEN HEARING LOSS 03/20/2009  . HYPERTENSION 06/17/2007  . BRADYCARDIA 12/17/2007  . CAROTID ARTERY DISEASE 07/23/2009  . COPD 09/18/2008  . GERD 06/17/2007  . PEPTIC ULCER DISEASE 06/17/2007  . DIVERTICULOSIS, COLON 06/17/2007  . CONSTIPATION 08/09/2009  . Alcoholic cirrhosis of liver 06/17/2007  . RENAL INSUFFICIENCY 09/18/2008  . OSTEOARTHRITIS, KNEES, BILATERAL 06/19/2009  . LOW BACK PAIN, CHRONIC 12/17/2009  .  BACK PAIN 08/09/2009  . OSTEOPOROSIS 06/17/2007  . FATIGUE 06/18/2007  . RASH-NONVESICULAR 06/18/2007  . DYSPNEA ON EXERTION 06/20/2008  . PSA, INCREASED 09/18/2008  . ECHOCARDIOGRAM, ABNORMAL 07/06/2008  . COMPRESSION FRACTURE, LUMBAR VERTEBRAE 08/09/2009  . COLONIC POLYPS, HX OF 06/17/2007  . Impaired glucose tolerance 12/13/2010   Past Surgical History  Procedure Date  . S/p bowel obstruction surgury 12/01  . S/p bilroth ii   . Cholecystectomy   . Lumbar laminectomy     x 2  . Inguinal herniorrhapy   . S/p left middle ear surgury late 80's    Dr. Ann Maki    reports that he has quit smoking. He does not have any smokeless tobacco history on file. He reports that he does not drink alcohol. His drug history not on file. family history includes Diabetes in his other. Allergies  Allergen Reactions  . Ibuprofen     REACTION: itching  . WUJ:WJXBJYNWGNF+AOZHYQMVH+QIONGEXBMW Acid+Aspartame     REACTION: rash   Current Outpatient Prescriptions on File Prior to Visit  Medication Sig Dispense Refill  . Multiple Vitamin (MULTIVITAMINS PO) Take by mouth.        Marland Kitchen omeprazole (PRILOSEC) 20 MG capsule Take 20 mg by mouth 2 (two) times daily at 10 AM and 5 PM.        . oxyCODONE (ROXICODONE) 5 MG immediate release tablet Take 5 mg by mouth. 1-3 by mouth every 6 hours as needed for pain       . pravastatin (PRAVACHOL) 40 MG tablet Take 40 mg by mouth daily.        Marland Kitchen  albuterol-ipratropium (COMBIVENT) 18-103 MCG/ACT inhaler Inhale 2 puffs into the lungs every 4 (four) hours as needed.        Marland Kitchen aspirin 81 MG tablet Take 81 mg by mouth daily.         Review of Systems All otherwise neg per pt     Objective:   Physical Exam BP 162/62  Pulse 63  Temp(Src) 98.6 F (37 C) (Oral)  Ht 6' (1.829 m)  Wt 137 lb 4 oz (62.256 kg)  BMI 18.61 kg/m2  SpO2 95% Physical Exam  VS noted Constitutional: Pt appears well-developed and well-nourished.  HENT: Head: Normocephalic.  Right Ear: External  ear normal.  Left Ear: External ear normal.  Eyes: Conjunctivae and EOM are normal. Pupils are equal, round, and reactive to light.  Neck: Normal range of motion. Neck supple.  Cardiovascular: Normal rate and regular rhythm.   Pulmonary/Chest: Effort normal and breath sounds normal.  Abd:  Soft, NT, non-distended, + BS Neurological: Pt is alert. No cranial nerve deficit. motor intact LE's, some decr sens to LT Skin: Skin is warm. No erythema.  Psychiatric: Pt behavior is normal. Thought content normal.         Assessment & Plan:

## 2010-12-16 NOTE — Patient Instructions (Signed)
Continue all other medications as before Please return in 6 mo with Lab testing done 3-5 days before  

## 2011-05-22 ENCOUNTER — Other Ambulatory Visit: Payer: Self-pay

## 2011-05-22 MED ORDER — OXYCODONE HCL 5 MG PO TABS: 5.0000 mg | ORAL_TABLET | Freq: Four times a day (QID) | ORAL | Status: DC | PRN

## 2011-05-22 MED ORDER — OMEPRAZOLE 20 MG PO CPDR: 20.0000 mg | DELAYED_RELEASE_CAPSULE | Freq: Two times a day (BID) | ORAL | Status: DC

## 2011-05-22 NOTE — Telephone Encounter (Signed)
Patient came to the office and completed a walk in note. The patient is scheduled for an appointment on 06/18/2011, He is requesting a written prescription to be mailed for Prilosec 20 mg and Oxycodone HCL 5 mg. Call back number is [phone]

## 2011-05-22 NOTE — Telephone Encounter (Signed)
Done hardcopy to robin  

## 2011-05-22 NOTE — Telephone Encounter (Signed)
Called the patient informed prescriptions requested were written, but we could not mail these to him. Informed the patient the prescriptions would be in the cabnet at the front desk ready for pickup. The patient agreed to come and pickup.

## 2011-06-03 ENCOUNTER — Telehealth: Payer: Self-pay | Admitting: Internal Medicine

## 2011-06-03 MED ORDER — OMEPRAZOLE 20 MG PO CPDR: 20.0000 mg | DELAYED_RELEASE_CAPSULE | Freq: Two times a day (BID) | ORAL | Status: DC

## 2011-06-03 NOTE — Telephone Encounter (Signed)
Done hardcopy to robin  

## 2011-06-04 NOTE — Telephone Encounter (Signed)
Called the patient informed prescription requested is ready for pickup 

## 2011-06-18 ENCOUNTER — Other Ambulatory Visit (INDEPENDENT_AMBULATORY_CARE_PROVIDER_SITE_OTHER): Payer: Medicare Other

## 2011-06-18 ENCOUNTER — Ambulatory Visit (INDEPENDENT_AMBULATORY_CARE_PROVIDER_SITE_OTHER): Payer: Medicare Other | Admitting: Internal Medicine

## 2011-06-18 ENCOUNTER — Encounter: Payer: Self-pay | Admitting: Internal Medicine

## 2011-06-18 VITALS — BP 130/48 | HR 71 | Temp 97.8°F | Wt 136.4 lb

## 2011-06-18 DIAGNOSIS — R58 Hemorrhage, not elsewhere classified: Secondary | ICD-10-CM

## 2011-06-18 DIAGNOSIS — I10 Essential (primary) hypertension: Secondary | ICD-10-CM

## 2011-06-18 DIAGNOSIS — E785 Hyperlipidemia, unspecified: Secondary | ICD-10-CM

## 2011-06-18 DIAGNOSIS — I998 Other disorder of circulatory system: Secondary | ICD-10-CM

## 2011-06-18 DIAGNOSIS — R5383 Other fatigue: Secondary | ICD-10-CM

## 2011-06-18 DIAGNOSIS — K219 Gastro-esophageal reflux disease without esophagitis: Secondary | ICD-10-CM

## 2011-06-18 DIAGNOSIS — R7302 Impaired glucose tolerance (oral): Secondary | ICD-10-CM

## 2011-06-18 DIAGNOSIS — R7309 Other abnormal glucose: Secondary | ICD-10-CM

## 2011-06-18 DIAGNOSIS — R5381 Other malaise: Secondary | ICD-10-CM

## 2011-06-18 LAB — HEPATIC FUNCTION PANEL
ALT: 19 U/L (ref 0–53)
AST: 26 U/L (ref 0–37)
Alkaline Phosphatase: 97 U/L (ref 39–117)
Bilirubin, Direct: 0.1 mg/dL (ref 0.0–0.3)
Total Bilirubin: 0.8 mg/dL (ref 0.3–1.2)
Total Protein: 7.7 g/dL (ref 6.0–8.3)

## 2011-06-18 LAB — URINALYSIS, ROUTINE W REFLEX MICROSCOPIC
Bilirubin Urine: NEGATIVE
Hgb urine dipstick: NEGATIVE
Nitrite: NEGATIVE
Total Protein, Urine: 30
Urine Glucose: NEGATIVE
Urobilinogen, UA: 0.2 (ref 0.0–1.0)

## 2011-06-18 LAB — CBC WITH DIFFERENTIAL/PLATELET
Basophils Absolute: 0 10*3/uL (ref 0.0–0.1)
Basophils Relative: 0.4 % (ref 0.0–3.0)
Eosinophils Absolute: 0.1 10*3/uL (ref 0.0–0.7)
Lymphocytes Relative: 16.8 % (ref 12.0–46.0)
MCHC: 34.2 g/dL (ref 30.0–36.0)
MCV: 98.6 fl (ref 78.0–100.0)
Monocytes Absolute: 0.7 10*3/uL (ref 0.1–1.0)
Neutrophils Relative %: 75.1 % (ref 43.0–77.0)
RDW: 12.8 % (ref 11.5–14.6)

## 2011-06-18 LAB — BASIC METABOLIC PANEL
CO2: 28 mEq/L (ref 19–32)
Chloride: 103 mEq/L (ref 96–112)
Potassium: 4.4 mEq/L (ref 3.5–5.1)
Sodium: 139 mEq/L (ref 135–145)

## 2011-06-18 LAB — PROTIME-INR: INR: 1 ratio (ref 0.8–1.0)

## 2011-06-18 LAB — LIPID PANEL
Cholesterol: 155 mg/dL (ref 0–200)
HDL: 47 mg/dL (ref >39.00)

## 2011-06-18 MED ORDER — PANTOPRAZOLE SODIUM 20 MG PO TBEC: 20.0000 mg | DELAYED_RELEASE_TABLET | Freq: Two times a day (BID) | ORAL | Status: DC

## 2011-06-18 NOTE — Patient Instructions (Addendum)
Ok to stop the prilosec Please start the generic for protonix 40 mg twice per day Continue all other medications as before Please go to LAB in the Basement for the blood and/or urine tests to be done today, including the test for bleeding you mentioned (PT/INR) Please call the phone number [phone] (the [employer] System) for results of testing in 2-3 days;  When calling, simply dial the number, and when prompted enter the MRN number above (the Medical Record Number) and the # key, then the message should start. Please remember to use your walker for support even in your home to help reduce risk of falls Please return in 6 months, or sooner if needed

## 2011-06-18 NOTE — Assessment & Plan Note (Signed)
With hx of etoh  In the past; for INR

## 2011-06-19 ENCOUNTER — Encounter: Payer: Self-pay | Admitting: Internal Medicine

## 2011-06-19 NOTE — Progress Notes (Signed)
Subjective:    Patient ID: Sean [name], male    DOB: 09-04-1924, 77 y.o.   MRN: [id number]  HPI  Here to f/u; overall doing ok,  Pt denies chest pain, increased sob or doe, wheezing, orthopnea, PND, increased LE swelling, palpitations, dizziness or syncope.  Pt denies new neurological symptoms such as new headache, or facial or extremity weakness or numbness   Pt denies polydipsia, polyuria, or low sugar symptoms such as weakness or confusion improved with po intake.  Pt states overall good compliance with meds, trying to follow lower cholesterol, diabetic diet, wt overall stable but little exercise however.  Has had ongoing mild uncontrolled reflux despite good med compliacne. Denies worsening dysphagia, abd pain, n/v, bowel change or blood. No other new complaints except for mild increased balance issue, here with cane today but has walker at home (which he does not use often), has hx of recent fall with retropulsion but no signficant injury last wk except for left wrist sprain, neg film per ortho  Does also have some easy bruising to arms in the past yr without obvious injury, and he reqeusts a bleeding test such as INR.  No other overt bleeding or bruising  Does have sense of ongoing fatigue, but denies signficant hypersomnolence. Past Medical History  Diagnosis Date  . BLADDER CANCER 06/17/2007  . VITAMIN D DEFICIENCY 09/18/2008  . GLUCOSE INTOLERANCE 06/17/2007  . HYPERLIPIDEMIA 06/17/2007  . ABUSE, ALCOHOL, IN REMISSION 06/17/2007  . UNSPECIFIED SUDDEN HEARING LOSS 03/20/2009  . HYPERTENSION 06/17/2007  . BRADYCARDIA 12/17/2007  . CAROTID ARTERY DISEASE 07/23/2009  . COPD 09/18/2008  . GERD 06/17/2007  . PEPTIC ULCER DISEASE 06/17/2007  . DIVERTICULOSIS, COLON 06/17/2007  . CONSTIPATION 08/09/2009  . Alcoholic cirrhosis of liver 06/17/2007  . RENAL INSUFFICIENCY 09/18/2008  . OSTEOARTHRITIS, KNEES, BILATERAL 06/19/2009  . LOW BACK PAIN, CHRONIC 12/17/2009  . BACK PAIN 08/09/2009  .  OSTEOPOROSIS 06/17/2007  . FATIGUE 06/18/2007  . RASH-NONVESICULAR 06/18/2007  . DYSPNEA ON EXERTION 06/20/2008  . PSA, INCREASED 09/18/2008  . ECHOCARDIOGRAM, ABNORMAL 07/06/2008  . COMPRESSION FRACTURE, LUMBAR VERTEBRAE 08/09/2009  . COLONIC POLYPS, HX OF 06/17/2007  . Impaired glucose tolerance 12/13/2010   Past Surgical History  Procedure Date  . S/p bowel obstruction surgury 12/01  . S/p bilroth ii   . Cholecystectomy   . Lumbar laminectomy     x 2  . Inguinal herniorrhapy   . S/p left middle ear surgury late 80's    Dr. Ann Maki    reports that he has quit smoking. He does not have any smokeless tobacco history on file. He reports that he does not drink alcohol. His drug history not on file. family history includes Diabetes in his other. Allergies  Allergen Reactions  . Ibuprofen     REACTION: itching  . YQM:VHQIONGEXBM+WUXLKGMWN+UUVOZDGUYQ Acid+Aspartame     REACTION: rash   Current Outpatient Prescriptions on File Prior to Visit  Medication Sig Dispense Refill  . Multiple Vitamin (MULTIVITAMINS PO) Take by mouth.        . oxyCODONE (ROXICODONE) 5 MG immediate release tablet Take 1 tablet (5 mg total) by mouth every 6 (six) hours as needed for pain.  100 tablet  0  . aspirin 81 MG tablet Take 81 mg by mouth daily.        . pravastatin (PRAVACHOL) 40 MG tablet Take 40 mg by mouth daily.         Review of Systems Review of Systems  Constitutional: Negative for  diaphoresis and unexpected weight change.  HENT: Negative for drooling and tinnitus.   Eyes: Negative for photophobia and visual disturbance.  Respiratory: Negative for choking and stridor.   Gastrointestinal: Negative for vomiting and blood in stool.  Genitourinary: Negative for hematuria and decreased urine volume.    Objective:   Physical Exam BP 130/48  Pulse 71  Temp(Src) 97.8 F (36.6 C) (Oral)  Wt 136 lb 6 oz (61.859 kg)  SpO2 94% Physical Exam  VS noted, thin and more frail today, not confused,  walks with cane, Constitutional: Pt appears well-developed and well-nourished.  HENT: Head: Normocephalic.  Right Ear: External ear normal.  Left Ear: External ear normal.  Eyes: Conjunctivae and EOM are normal. Pupils are equal, round, and reactive to light.  Neck: Normal range of motion. Neck supple.  Cardiovascular: Normal rate and regular rhythm.   Pulmonary/Chest: Effort normal and breath sounds normal.  Abd:  Soft, NT, non-distended, + BS Neurological: Pt is alert. No cranial nerve deficit. gait somewhat unsteady Skin: Skin is warm. No erythema.  No bruising noted today to extremities Psychiatric: Pt behavior is normal. Thought content normal.     Assessment & Plan:

## 2011-06-19 NOTE — Assessment & Plan Note (Signed)
stable overall by hx and exam, most recent data reviewed with pt, and pt to continue medical treatment as before   Lab Results  Component Value Date   HGBA1C 5.7 06/17/2010

## 2011-06-19 NOTE — Assessment & Plan Note (Signed)
stable overall by hx and exam, most recent data reviewed with pt, and pt to continue medical treatment as before Lab Results  Component Value Date   LDLCALC 81 06/17/2010

## 2011-06-19 NOTE — Assessment & Plan Note (Signed)
Etiology unclear, Exam otherwise benign, to check labs as documented, follow with expectant management  

## 2011-06-19 NOTE — Assessment & Plan Note (Signed)
Uncontroleld, to change PPI to protonix bid,  to f/u any worsening symptoms or concerns

## 2011-06-19 NOTE — Assessment & Plan Note (Signed)
stable overall by hx and exam, most recent data reviewed with pt, and pt to continue medical treatment as before  BP Readings from Last 3 Encounters:  06/18/11 130/48  12/16/10 162/62  06/17/10 132/48

## 2011-06-20 LAB — HEMOGLOBIN A1C: Hgb A1c MFr Bld: 5.8 % (ref 4.6–6.5)

## 2011-07-28 ENCOUNTER — Other Ambulatory Visit: Payer: Self-pay | Admitting: Cardiology

## 2011-07-28 DIAGNOSIS — I6529 Occlusion and stenosis of unspecified carotid artery: Secondary | ICD-10-CM

## 2011-08-04 ENCOUNTER — Encounter: Payer: Medicare Other | Admitting: Cardiology

## 2011-08-11 DIAGNOSIS — H43819 Vitreous degeneration, unspecified eye: Secondary | ICD-10-CM | POA: Diagnosis not present

## 2011-08-11 DIAGNOSIS — H35379 Puckering of macula, unspecified eye: Secondary | ICD-10-CM | POA: Diagnosis not present

## 2011-08-11 DIAGNOSIS — H353290 Exudative age-related macular degeneration, unspecified eye, stage unspecified: Secondary | ICD-10-CM | POA: Diagnosis not present

## 2011-08-11 DIAGNOSIS — H26499 Other secondary cataract, unspecified eye: Secondary | ICD-10-CM | POA: Diagnosis not present

## 2011-08-13 ENCOUNTER — Encounter (INDEPENDENT_AMBULATORY_CARE_PROVIDER_SITE_OTHER): Payer: Medicare Other | Admitting: Cardiology

## 2011-08-13 DIAGNOSIS — R0989 Other specified symptoms and signs involving the circulatory and respiratory systems: Secondary | ICD-10-CM | POA: Diagnosis not present

## 2011-08-13 DIAGNOSIS — I6529 Occlusion and stenosis of unspecified carotid artery: Secondary | ICD-10-CM

## 2011-09-29 ENCOUNTER — Encounter: Payer: Self-pay | Admitting: Gastroenterology

## 2011-10-10 ENCOUNTER — Ambulatory Visit (INDEPENDENT_AMBULATORY_CARE_PROVIDER_SITE_OTHER): Payer: Medicare Other | Admitting: Nurse Practitioner

## 2011-10-10 VITALS — BP 154/60 | HR 72 | Ht 72.0 in | Wt 127.8 lb

## 2011-10-10 DIAGNOSIS — K5900 Constipation, unspecified: Secondary | ICD-10-CM

## 2011-10-10 DIAGNOSIS — K5730 Diverticulosis of large intestine without perforation or abscess without bleeding: Secondary | ICD-10-CM | POA: Diagnosis not present

## 2011-10-10 DIAGNOSIS — K219 Gastro-esophageal reflux disease without esophagitis: Secondary | ICD-10-CM | POA: Diagnosis not present

## 2011-10-10 DIAGNOSIS — R634 Abnormal weight loss: Secondary | ICD-10-CM | POA: Diagnosis not present

## 2011-10-10 NOTE — Progress Notes (Signed)
10/10/2011 Sean [name] [id number] [birth year]   HISTORY OF PRESENT ILLNESS: Patient is an 78 year old male with multiple medical problems known to Dr. Russella Dar for history of adenomatous colon polyps and internal hemorrhoids. He also has a very remote history of PUD for which he has had a partial gastrectomy.   Sean [name] has been referred here for evaluation of heartburn. In November PCP changed patient's PPI to twice daily Protonix but it hasn't made any difference. He has frequent heartburn. Some of his medication are sometimes difficult to swallow but denies dysphagia to food. Patient has no appetitie, he has lost 9-10 pounds since late November. No significant nausea or abdominal pain. Denies NSAIDS,except for one baby asa daily. He takes one Hydrocodone at bedtime for pain.   Patient endorses constipation. He takes MOM from time to time.   Patient's granddaughter,  Physician in [place], is getting married in May in [facility] and patient looks forward to attending the wedding. He really wants to feel better between now and then.     Past Medical History  Diagnosis Date  . BLADDER CANCER 06/17/2007  . VITAMIN D DEFICIENCY 09/18/2008  . GLUCOSE INTOLERANCE 06/17/2007  . HYPERLIPIDEMIA 06/17/2007  . ABUSE, ALCOHOL, IN REMISSION 06/17/2007  . UNSPECIFIED SUDDEN HEARING LOSS 03/20/2009  . HYPERTENSION 06/17/2007  . CAROTID ARTERY DISEASE 07/23/2009  . COPD 09/18/2008  . GERD 06/17/2007  . PEPTIC ULCER DISEASE 06/17/2007  . DIVERTICULOSIS, COLON 06/17/2007  . Alcoholic cirrhosis of liver 06/17/2007  . RENAL INSUFFICIENCY 09/18/2008  . OSTEOARTHRITIS, KNEES, BILATERAL 06/19/2009  . LOW BACK PAIN, CHRONIC 12/17/2009  . OSTEOPOROSIS 06/17/2007  . PSA, INCREASED 09/18/2008  . ECHOCARDIOGRAM, ABNORMAL 07/06/2008  . COMPRESSION FRACTURE, LUMBAR VERTEBRAE 08/09/2009  . Adenomatous colon polyp 01/1984  . Impaired glucose tolerance 12/13/2010  . Tubulovillous adenoma 04/2000    Duodenal polyp  .  Internal hemorrhoids   . Hiatal hernia    Past Surgical History  Procedure Date  . S/p bowel obstruction surgury 12/01  . S/p bilroth ii   . Cholecystectomy 04/2000  . Lumbar laminectomy     x 2  . Inguinal herniorrhapy   . S/p left middle ear surgury late 80's    Dr. Ann Maki    reports that he quit smoking about 7 years ago. He has never used smokeless tobacco. He reports that he does not drink alcohol or use illicit drugs. family history includes Diabetes in his other. Allergies  Allergen Reactions  . Ibuprofen     REACTION: itching  . WUJ:WJXBJYNWGNF+AOZHYQMVH+QIONGEXBMW Acid+Aspartame     REACTION: rash      Outpatient Encounter Prescriptions as of 10/10/2011  Medication Sig Dispense Refill  . aspirin 81 MG tablet Take 81 mg by mouth daily.        . Multiple Vitamin (MULTIVITAMINS PO) Take by mouth.        . oxyCODONE (ROXICODONE) 5 MG immediate release tablet Take 1 tablet (5 mg total) by mouth every 6 (six) hours as needed for pain.  100 tablet  0  . pantoprazole (PROTONIX) 20 MG tablet Take 1 tablet (20 mg total) by mouth 2 (two) times daily.  60 tablet  11  . pravastatin (PRAVACHOL) 40 MG tablet Take 40 mg by mouth daily.           REVIEW OF SYSTEMS  : Positive for back pain, fatigue, headaches, muscle cramps and excessive urination. All other systems reviewed and negative except where noted in the History of Present Illness.  PHYSICAL EXAM: BP 154/60  Pulse 72  Ht 6' (1.829 m)  Wt 127 lb 12.8 oz (57.97 kg)  BMI 17.33 kg/m2 General: Thin white male in no acute distress Head: Normocephalic and atraumatic Eyes:  sclerae anicteric,conjunctive pink. Ears: Normal auditory acuity Mouth: No deformity or lesions Neck: Supple, no masses.  Lungs: Clear throughout to auscultation Heart: Regular rate and rhythm Abdomen: Soft, non distended, nontender. No masses or hepatomegaly noted. Normal Bowel sounds Rectal:not done Musculoskeletal: Symmetrical with no gross  deformities  Skin: No lesions on visible extremities Extremities: No edema or deformities noted Neurological: Alert oriented, grossly nonfocal Cervical Nodes:  No significant cervical adenopathy Psychological:  Alert , talkative. Normal mood and affect  ASSESSMENT AND PLAN; 1. Heartburn refractory to 3 months of high dose PPI. Patient has also had documented weight loss of 9 pounds in last three months which he attributes to poor appetite. Extensive labs 06/18/11 including CBC, BMET, LFTs, INR, and TSH were unremarkable.  For further evaluation patient will be scheduled for EGD.  The benefits, risks, and potential complications of EGD with possible biopsies were discussed with the patient and he agrees to proceed.   2. History of adenomatous colon polyps, no polyps on last surveillance exam in 2008. No plans for future surveillance colonoscopies given advanced age and absence of polyps on 2008 colonoscopy.  3. Decreased appetite with weight loss, documented loss of 9 pounds since visit with PCP on 06/17/12.   4. Remote history of PUD, partial gastrectomy in 1960's.   5. ? history of cirrhosis. This is listed in PMH but I am unable to verify this.  6. Constipation, possibly secondary to pain medications though he only takes one at bedtime. Trial of daily Miralax.

## 2011-10-10 NOTE — Patient Instructions (Signed)
Take Miralax, 17 grams in 8 oz of water daily. We have scheduled the Endoscopy with Dr. Russella Dar on 10-16-2011. Directions provided.  Upper GI Endoscopy  Upper GI endoscopy means using a flexible scope to look at the esophagus, stomach, and upper small bowel. This is done to make a diagnosis in people with heartburn, abdominal pain, or abnormal bleeding. Sometimes an endoscope is needed to remove foreign bodies or food that become stuck in the esophagus; it can also be used to take biopsy samples. For the best results, do not eat or drink for 8 hours before having your upper endoscopy.  To perform the endoscopy, you will probably be sedated and your throat will be numbed with a special spray. The endoscope is then slowly passed down your throat (this will not interfere with your breathing). An endoscopy exam takes 15 to 30 minutes to complete and there is no real pain. Patients rarely remember much about the procedure. The results of the test may take several days if a biopsy or other test is taken.  You may have a sore throat after an endoscopy exam. Serious complications are very rare. Stick to liquids and soft foods until your pain is better. Do not drive a car or operate any dangerous equipment for at least 24 hours after being sedated. SEEK IMMEDIATE MEDICAL CARE IF:   You have severe throat pain.   You have shortness of breath.   You have bleeding problems.   You have a fever.   You have difficulty recovering from your sedation.  Document Released: 08/21/2004 Document Revised: 07/03/2011 Document Reviewed: 07/16/2008 [facility] Patient Information 2012 [place], [place].

## 2011-10-11 ENCOUNTER — Encounter: Payer: Self-pay | Admitting: Nurse Practitioner

## 2011-10-13 NOTE — Progress Notes (Signed)
i agree with the plan outlined in this note 

## 2011-10-14 ENCOUNTER — Encounter: Payer: Self-pay | Admitting: Gastroenterology

## 2011-10-15 ENCOUNTER — Ambulatory Visit: Payer: Medicare Other | Admitting: Gastroenterology

## 2011-10-16 ENCOUNTER — Encounter: Payer: Self-pay | Admitting: Gastroenterology

## 2011-10-16 ENCOUNTER — Other Ambulatory Visit: Payer: Medicare Other | Admitting: Gastroenterology

## 2011-10-16 ENCOUNTER — Ambulatory Visit (AMBULATORY_SURGERY_CENTER): Payer: Medicare Other | Admitting: Gastroenterology

## 2011-10-16 VITALS — BP 146/63 | HR 60 | Temp 95.6°F | Resp 16 | Ht 72.0 in | Wt 127.0 lb

## 2011-10-16 DIAGNOSIS — K219 Gastro-esophageal reflux disease without esophagitis: Secondary | ICD-10-CM | POA: Diagnosis not present

## 2011-10-16 DIAGNOSIS — R634 Abnormal weight loss: Secondary | ICD-10-CM

## 2011-10-16 MED ORDER — SODIUM CHLORIDE 0.9 % IV SOLN: 500.0000 mL | INTRAVENOUS | Status: DC

## 2011-10-16 MED ORDER — SUCRALFATE 1 GM/10ML PO SUSP: 1.0000 g | Freq: Three times a day (TID) | ORAL | Status: DC

## 2011-10-16 NOTE — Progress Notes (Signed)
Patient did not experience any of the following events: a burn prior to discharge; a fall within the facility; wrong site/side/patient/procedure/implant event; or a hospital transfer or hospital admission upon discharge from the facility.  Patient did not have preoperative order for IV antibiotic SSI prophylaxis

## 2011-10-16 NOTE — Patient Instructions (Signed)
Endoscopy Care After Please read the instructions outlined below and refer to this sheet in the next few weeks. These discharge instructions provide you with general information on caring for yourself after you leave the hospital. Your doctor may also give you specific instructions. While your treatment has been planned according to the most current medical practices available, unavoidable complications occasionally occur. If you have any problems or questions after discharge, please call your doctor. HOME CARE INSTRUCTIONS Activity  You may resume your regular activity but move at a slower pace for the next 24 hours.   Take frequent rest periods for the next 24 hours.   Walking will help expel (get rid of) the air and reduce the bloated feeling in your abdomen.   No driving for 24 hours (because of the anesthesia (medicine) used during the test).   You may shower.   Do not sign any important legal documents or operate any machinery for 24 hours (because of the anesthesia used during the test).  Nutrition  Drink plenty of fluids.   You may resume your normal diet.   Begin with a light meal and progress to your normal diet.   Avoid alcoholic beverages for 24 hours or as instructed by your caregiver.  Medications You may resume your normal medications unless your caregiver tells you otherwise. What you can expect today  You may experience abdominal discomfort such as a feeling of fullness or "gas" pains.   You may experience a sore throat for 2 to 3 days. This is normal. Gargling with salt water may help this.  Follow-up Your doctor will discuss the results of your test with you. SEEK IMMEDIATE MEDICAL CARE IF:  You have excessive nausea (feeling sick to your stomach) and/or vomiting.   You have severe abdominal pain and distention (swelling).   You have trouble swallowing.   You have a temperature over 100 F (37.8 C).   You have rectal bleeding or vomiting of blood.   

## 2011-10-16 NOTE — Op Note (Signed)
[facility] [address] [place], [place]  [address]  ENDOSCOPY PROCEDURE REPORT  PATIENT:  Sean [name], Sean [name]  MR#:  [id number] BIRTHDATE:  [birth year], 86 yrs. old  GENDER:  male ENDOSCOPIST:  Judie Petit T. Russella Dar, MD, [facility]  PROCEDURE DATE:  10/16/2011 PROCEDURE:  EGD, diagnostic 43235 ASA CLASS:  Class II INDICATIONS:  GERD, weight loss MEDICATIONS:  These medications were titrated to patient response per physician's verbal order, Fentanyl 37.5 mcg IV, Versed 4 mg IV TOPICAL ANESTHETIC:  Cetacaine Spray DESCRIPTION OF PROCEDURE:   After the risks benefits and alternatives of the procedure were thoroughly explained, informed consent was obtained.  The LB [id number] endoscope was introduced through the mouth and advanced to the second portion of the duodenum, without limitations.  The instrument was slowly withdrawn as the mucosa was fully examined. <<PROCEDUREIMAGES>> S/P Bilroth II gastrectomy was found. Widely patent, normal appearing anastomosis. Otherwise normal stomach. The esophagus and gastroesophageal junction were completely normal in appearance. The afferent and efferent limbs were entered and examined for 20-30 cm each and appeared normal. Retroflexed views revealed no abnormalities.  The scope was then withdrawn from the patient and the procedure completed.  COMPLICATIONS:  None  ENDOSCOPIC IMPRESSION: 1) S/P Bilroth II gastrectomy  RECOMMENDATIONS: 1) Anti-reflux regimen 2) Continue PPI 3) Carafate suspension 1g po tid, 1 month supply, 11 refills  Jordy Verba T. Russella Dar, MD, [facility]  n. eSIGNED:   Venita Lick. Dyer Klug at 10/16/2011 11:45 AM  Miquel Dunn, [id number]

## 2011-10-17 ENCOUNTER — Telehealth: Payer: Self-pay

## 2011-10-17 NOTE — Telephone Encounter (Signed)
  Follow up Call-  Call back number 10/16/2011  Post procedure Call Back phone  # [phone]  Permission to leave phone message Yes     Patient questions:  Do you have a fever, pain , or abdominal swelling? no Pain Score  0 *  Have you tolerated food without any problems? yes  Have you been able to return to your normal activities? yes  Do you have any questions about your discharge instructions: Diet   no Medications  no Follow up visit  no  Do you have questions or concerns about your Care? no  Actions: * If pain score is 4 or above: No action needed, pain <4.

## 2011-11-14 ENCOUNTER — Other Ambulatory Visit: Payer: Self-pay

## 2011-11-14 DIAGNOSIS — K219 Gastro-esophageal reflux disease without esophagitis: Secondary | ICD-10-CM

## 2011-11-14 MED ORDER — PANTOPRAZOLE SODIUM 20 MG PO TBEC: 20.0000 mg | DELAYED_RELEASE_TABLET | Freq: Two times a day (BID) | ORAL | Status: DC

## 2011-11-21 ENCOUNTER — Telehealth: Payer: Self-pay | Admitting: Internal Medicine

## 2011-11-21 MED ORDER — OXYCODONE HCL 5 MG PO TABS: 5.0000 mg | ORAL_TABLET | Freq: Four times a day (QID) | ORAL | Status: DC | PRN

## 2011-11-21 NOTE — Telephone Encounter (Signed)
Done hardcopy to robin  

## 2011-11-21 NOTE — Telephone Encounter (Signed)
Called the patient informed prescription is ready for pickup at the front desk. 

## 2011-11-21 NOTE — Telephone Encounter (Signed)
Request refill on oxycodone 5mg 

## 2011-12-17 ENCOUNTER — Ambulatory Visit: Payer: Medicare Other | Admitting: Internal Medicine

## 2011-12-25 ENCOUNTER — Other Ambulatory Visit (INDEPENDENT_AMBULATORY_CARE_PROVIDER_SITE_OTHER): Payer: Medicare Other

## 2011-12-25 ENCOUNTER — Encounter: Payer: Self-pay | Admitting: Internal Medicine

## 2011-12-25 ENCOUNTER — Ambulatory Visit (INDEPENDENT_AMBULATORY_CARE_PROVIDER_SITE_OTHER): Payer: Medicare Other | Admitting: Internal Medicine

## 2011-12-25 VITALS — BP 132/62 | HR 65 | Temp 98.1°F | Ht 72.0 in | Wt 115.2 lb

## 2011-12-25 DIAGNOSIS — R7309 Other abnormal glucose: Secondary | ICD-10-CM | POA: Diagnosis not present

## 2011-12-25 DIAGNOSIS — R634 Abnormal weight loss: Secondary | ICD-10-CM | POA: Diagnosis not present

## 2011-12-25 DIAGNOSIS — R109 Unspecified abdominal pain: Secondary | ICD-10-CM

## 2011-12-25 DIAGNOSIS — N320 Bladder-neck obstruction: Secondary | ICD-10-CM | POA: Insufficient documentation

## 2011-12-25 DIAGNOSIS — K219 Gastro-esophageal reflux disease without esophagitis: Secondary | ICD-10-CM

## 2011-12-25 DIAGNOSIS — C679 Malignant neoplasm of bladder, unspecified: Secondary | ICD-10-CM

## 2011-12-25 DIAGNOSIS — R7302 Impaired glucose tolerance (oral): Secondary | ICD-10-CM

## 2011-12-25 LAB — URINALYSIS, ROUTINE W REFLEX MICROSCOPIC
Bilirubin Urine: NEGATIVE
Specific Gravity, Urine: 1.025 (ref 1.000–1.030)
Total Protein, Urine: 30
Urine Glucose: NEGATIVE
pH: 6 (ref 5.0–8.0)

## 2011-12-25 LAB — HEPATIC FUNCTION PANEL
Albumin: 4 g/dL (ref 3.5–5.2)
Alkaline Phosphatase: 76 U/L (ref 39–117)
Bilirubin, Direct: 0.1 mg/dL (ref 0.0–0.3)
Total Protein: 6.7 g/dL (ref 6.0–8.3)

## 2011-12-25 LAB — CBC WITH DIFFERENTIAL/PLATELET
Basophils Relative: 0.7 % (ref 0.0–3.0)
HCT: 36.1 % — ABNORMAL LOW (ref 39.0–52.0)
Hemoglobin: 12 g/dL — ABNORMAL LOW (ref 13.0–17.0)
Lymphocytes Relative: 28.2 % (ref 12.0–46.0)
Lymphs Abs: 1.8 10*3/uL (ref 0.7–4.0)
Monocytes Relative: 8.7 % (ref 3.0–12.0)
Neutro Abs: 3.9 10*3/uL (ref 1.4–7.7)
RBC: 3.61 Mil/uL — ABNORMAL LOW (ref 4.22–5.81)
RDW: 13.2 % (ref 11.5–14.6)

## 2011-12-25 LAB — PSA: PSA: 1.67 ng/mL (ref 0.10–4.00)

## 2011-12-25 LAB — HEMOGLOBIN A1C: Hgb A1c MFr Bld: 5.6 % (ref 4.6–6.5)

## 2011-12-25 LAB — BASIC METABOLIC PANEL
CO2: 29 mEq/L (ref 19–32)
Calcium: 9.1 mg/dL (ref 8.4–10.5)
Creatinine, Ser: 1.4 mg/dL (ref 0.4–1.5)
GFR: 50 mL/min — ABNORMAL LOW (ref >60.00)
Glucose, Bld: 83 mg/dL (ref 70–99)
Sodium: 141 mEq/L (ref 135–145)

## 2011-12-25 LAB — SEDIMENTATION RATE: Sed Rate: 1 mm/hr (ref 0–22)

## 2011-12-25 LAB — LIPASE: Lipase: 33 U/L (ref 11.0–59.0)

## 2011-12-25 MED ORDER — SUCRALFATE 1 GM/10ML PO SUSP: 1.0000 g | Freq: Three times a day (TID) | ORAL | Status: DC

## 2011-12-25 NOTE — Assessment & Plan Note (Addendum)
Etiology unclaer - ? Functional, but with documented wt loss and egd recently without significant abnormality, will proceed wit labs and CT as above, consider GI re-eval as well  Note;  Time for pt hx, exam, review of record with pt in room, determination of diagnoses, and plan for further eval and tx is > 40 min

## 2011-12-25 NOTE — Progress Notes (Signed)
Subjective:    Patient ID: Sean [name], male    DOB: 01/03/1925, 78 y.o.   MRN: [id number]  HPI  Here to f/u with wife present who along with pt is quite concerned about downturn in health recent;  Is s/p kyphoplasty with good results with improved overall back pain and mobility, also s/o EGD mar 21 essentially neg , states carafate not really helping, has GI f/u soon, denies dysphagia, n/v but still with perceived upper abd discomfort/pain without radiation, occasional breakthrough reflux, lower appetite and documented wt loss 136 to current 115 since last visit x 6 mo (with 12 just in the past month);  Has ? Mild depression but overall does not think significant, denies need for tx or counseling, has occas mild constipation as well but thinks overall relatively mild, denies abd distension, bloating, rectal pain or overt bleeding.  Pt denies chest pain, increased sob or doe, wheezing, orthopnea, PND, increased LE swelling, palpitations, dizziness or syncope.  Pt denies new neurological symptoms such as new headache, or facial or extremity weakness or numbness   Pt denies polydipsia, polyuria.   Pt denies fever, night sweats, or other constitutional symptoms.  Has been more or less lost to f/u with urology in approx 4 yrs, has hx of bladder ca, saw Dr Isabel Caprice.   Denies urinary symptoms such as dysuria, frequency, urgency,or gross hematuria.  Asks for refill carafate though he is not sure is helping.   Past Medical History  Diagnosis Date  . BLADDER CANCER 06/17/2007  . VITAMIN D DEFICIENCY 09/18/2008  . GLUCOSE INTOLERANCE 06/17/2007  . HYPERLIPIDEMIA 06/17/2007  . ABUSE, ALCOHOL, IN REMISSION 06/17/2007  . UNSPECIFIED SUDDEN HEARING LOSS 03/20/2009  . HYPERTENSION 06/17/2007  . CAROTID ARTERY DISEASE 07/23/2009  . COPD 09/18/2008  . GERD 06/17/2007  . PEPTIC ULCER DISEASE 06/17/2007  . DIVERTICULOSIS, COLON 06/17/2007  . Alcoholic cirrhosis of liver 06/17/2007  . RENAL INSUFFICIENCY 09/18/2008    . OSTEOARTHRITIS, KNEES, BILATERAL 06/19/2009  . LOW BACK PAIN, CHRONIC 12/17/2009  . OSTEOPOROSIS 06/17/2007  . PSA, INCREASED 09/18/2008  . ECHOCARDIOGRAM, ABNORMAL 07/06/2008  . COMPRESSION FRACTURE, LUMBAR VERTEBRAE 08/09/2009  . Adenomatous colon polyp 01/1984  . Impaired glucose tolerance 12/13/2010  . Tubulovillous adenoma 04/2000    Duodenal polyp  . Internal hemorrhoids   . Hiatal hernia    Past Surgical History  Procedure Date  . S/p bowel obstruction surgury 12/01  . S/p bilroth ii   . Cholecystectomy 04/2000  . Lumbar laminectomy     x 2  . Inguinal herniorrhapy   . S/p left middle ear surgury late 80's    Dr. Ann Maki    reports that he quit smoking about 7 years ago. He has never used smokeless tobacco. He reports that he does not drink alcohol or use illicit drugs. family history includes Diabetes in his other. Allergies  Allergen Reactions  . Amoxicillin-Pot Clavulanate     REACTION: rash  . Ibuprofen     REACTION: itching   Current Outpatient Prescriptions on File Prior to Visit  Medication Sig Dispense Refill  . aspirin 81 MG tablet Take 81 mg by mouth daily.        . Multiple Vitamin (MULTIVITAMINS PO) Take by mouth.        . oxyCODONE (ROXICODONE) 5 MG immediate release tablet Take 1 tablet (5 mg total) by mouth every 6 (six) hours as needed for pain.  100 tablet  0  . pantoprazole (PROTONIX) 20 MG tablet Take 1  tablet (20 mg total) by mouth 2 (two) times daily.  60 tablet  5  . pravastatin (PRAVACHOL) 40 MG tablet Take 40 mg by mouth daily.        Marland Kitchen DISCONTD: sucralfate (CARAFATE) 1 GM/10ML suspension Take 10 mLs (1 g total) by mouth 3 (three) times daily before meals.  420 mL  11   Review of Systems Review of Systems  Constitutional: Negative for diaphoresis and unexpected weight change.  HENT: Negative for drooling and tinnitus.   Eyes: Negative for photophobia and visual disturbance.  Respiratory: Negative for choking and stridor.    Gastrointestinal: Negative for vomiting and blood in stool.  Genitourinary: Negative for hematuria and decreased urine volume. , walks with cane, no recent falls Musculoskeletal: Negative for gait problem.  Skin: Negative for color change and wound.  Neurological: Negative for tremors and numbness.  Psychiatric/Behavioral: Negative for decreased concentration. The patient is not hyperactive.      Objective:   Physical Exam BP 132/62  Pulse 65  Temp(Src) 98.1 F (36.7 C) (Oral)  Ht 6' (1.829 m)  Wt 115 lb 4 oz (52.277 kg)  BMI 15.63 kg/m2  SpO2 97% Physical Exam  VS noted Constitutional: Pt appears well-developed and well-nourished.  HENT: Head: Normocephalic.  Right Ear: External ear normal.  Left Ear: External ear normal.  Eyes: Conjunctivae and EOM are normal. Pupils are equal, round, and reactive to light.  Neck: Normal range of motion. Neck supple.  Cardiovascular: Normal rate and regular rhythm.   Pulmonary/Chest: Effort normal and breath sounds normal.  Abd:  Soft, NT, non-distended, + BS, no HSM Neurological: Pt is alert. Not confused;  Motor/dtr intact Skin: Skin is warm. No erythema.  Psychiatric: Pt behavior is normal. Thought content normal. not overly nervous or depressed affect    Assessment & Plan:

## 2011-12-25 NOTE — Assessment & Plan Note (Signed)
In light of wt loss - for PSA

## 2011-12-25 NOTE — Assessment & Plan Note (Signed)
Ok for carafate refill,  to f/u any worsening symptoms or concerns

## 2011-12-25 NOTE — Assessment & Plan Note (Signed)
Lost to f/u, last seen approx 4 yrs per Dr Grapey/urology, now with recent wt loss suspicious- for referal back to urology for f/u

## 2011-12-25 NOTE — Assessment & Plan Note (Signed)
Unclear etiology, for labs, for CT abd/pelvis given pain and wt loss, differential includes bladder ca recurrence,  to f/u any worsening symptoms or concerns

## 2011-12-25 NOTE — Assessment & Plan Note (Signed)
asympt - for a1c but not likely to be elevated given the wt loss

## 2011-12-25 NOTE — Patient Instructions (Signed)
Continue all other medications as before Please go to LAB in the Basement for the blood and/or urine tests to be done today You will be contacted regarding the referral for: CT abdomen, and Dr Isabel Caprice (see PCC's now to schedule CT for tomorrow) Your carafate was refilled Please have the pharmacy call with any refills you may need. Please return in 1 month, or sooner if needed

## 2011-12-26 ENCOUNTER — Ambulatory Visit (INDEPENDENT_AMBULATORY_CARE_PROVIDER_SITE_OTHER)
Admission: RE | Admit: 2011-12-26 | Discharge: 2011-12-26 | Disposition: A | Discharge status: Home / Self Care | Payer: Medicare Other | Source: Ambulatory Visit | Attending: Internal Medicine | Admitting: Internal Medicine

## 2011-12-26 ENCOUNTER — Encounter: Payer: Self-pay | Admitting: Internal Medicine

## 2011-12-26 DIAGNOSIS — N269 Renal sclerosis, unspecified: Secondary | ICD-10-CM | POA: Diagnosis not present

## 2011-12-26 DIAGNOSIS — R109 Unspecified abdominal pain: Secondary | ICD-10-CM

## 2011-12-26 DIAGNOSIS — C679 Malignant neoplasm of bladder, unspecified: Secondary | ICD-10-CM

## 2011-12-26 DIAGNOSIS — N281 Cyst of kidney, acquired: Secondary | ICD-10-CM | POA: Diagnosis not present

## 2011-12-26 DIAGNOSIS — R634 Abnormal weight loss: Secondary | ICD-10-CM

## 2011-12-26 DIAGNOSIS — I77811 Abdominal aortic ectasia: Secondary | ICD-10-CM | POA: Diagnosis not present

## 2011-12-26 MED ORDER — IOHEXOL 300 MG/ML  SOLN
100.0000 mL | Freq: Once | INTRAMUSCULAR | Status: AC | PRN
  Administered 2011-12-26: 100 mL via INTRAVENOUS

## 2011-12-29 ENCOUNTER — Encounter: Payer: Self-pay | Admitting: Internal Medicine

## 2011-12-29 LAB — PROTEIN ELECTROPHORESIS, SERUM
Beta 2: 6.2 % (ref 3.2–6.5)
Gamma Globulin: 13.8 % (ref 11.1–18.8)

## 2012-01-26 DIAGNOSIS — H35379 Puckering of macula, unspecified eye: Secondary | ICD-10-CM | POA: Diagnosis not present

## 2012-01-26 DIAGNOSIS — H353190 Nonexudative age-related macular degeneration, unspecified eye, stage unspecified: Secondary | ICD-10-CM | POA: Diagnosis not present

## 2012-01-27 ENCOUNTER — Encounter: Payer: Self-pay | Admitting: Internal Medicine

## 2012-01-27 ENCOUNTER — Ambulatory Visit (INDEPENDENT_AMBULATORY_CARE_PROVIDER_SITE_OTHER): Payer: Medicare Other | Admitting: Internal Medicine

## 2012-01-27 VITALS — BP 128/60 | HR 57 | Temp 97.0°F | Ht 72.0 in | Wt 119.4 lb

## 2012-01-27 DIAGNOSIS — R634 Abnormal weight loss: Secondary | ICD-10-CM

## 2012-01-27 DIAGNOSIS — G8929 Other chronic pain: Secondary | ICD-10-CM | POA: Diagnosis not present

## 2012-01-27 DIAGNOSIS — K219 Gastro-esophageal reflux disease without esophagitis: Secondary | ICD-10-CM

## 2012-01-27 DIAGNOSIS — I10 Essential (primary) hypertension: Secondary | ICD-10-CM

## 2012-01-27 DIAGNOSIS — R109 Unspecified abdominal pain: Secondary | ICD-10-CM | POA: Diagnosis not present

## 2012-01-27 MED ORDER — OXYCODONE HCL 5 MG PO TABS: 5.0000 mg | ORAL_TABLET | Freq: Four times a day (QID) | ORAL | Status: DC | PRN

## 2012-01-27 NOTE — Patient Instructions (Addendum)
Please try the Dexilant 60 mg - 1 per day - in place of the generic protonix (pantoprazole) to see if this works better Please call the office for a prescription if this works better and you would like a new prescription (you may want to check with your pharmacy about the cost first, however) Continue all other medications as before Please have the pharmacy call with any refills you may need. Please keep your appointments with your specialists as you have planned  - Dr Isabel Caprice We will fax your information today from your last visit You are given the pain medication refill today Please return in 6 months, or sooner if needed

## 2012-01-29 NOTE — Progress Notes (Signed)
Subjective:    Patient ID: Sean [name], male    DOB: 1924/08/31, 78 y.o.   MRN: [id number]  HPI  Here to f/u, wife not present this visit,  Pt states overall feels improved but difficult to be specific, except appetitie somewhat better, has gained 4 lbs with ensure, abd pain from last visit lessened, still on chronic pain med with pain to back and knees overall stable;  Does have some occasional breakthrough reflux despite carafate and generic protonix, but no dysphagia, abd pain, n/v, bowel change or blood. Had EGD mar 2012 per Dr Sharmon Leyden, and recent CT abd/pelvis neg for acute except did have thickened bladder.  Has f/u appt with urology/Dr Isabel Caprice July 9. Overall good compliance with treatment, and good medicine tolerability.  Pt denies chest pain, increased sob or doe, wheezing, orthopnea, PND, increased LE swelling, palpitations, dizziness or syncope.   Pt denies polydipsia, polyuria.   Past Medical History  Diagnosis Date  . BLADDER CANCER 06/17/2007  . VITAMIN D DEFICIENCY 09/18/2008  . GLUCOSE INTOLERANCE 06/17/2007  . HYPERLIPIDEMIA 06/17/2007  . ABUSE, ALCOHOL, IN REMISSION 06/17/2007  . UNSPECIFIED SUDDEN HEARING LOSS 03/20/2009  . HYPERTENSION 06/17/2007  . CAROTID ARTERY DISEASE 07/23/2009  . COPD 09/18/2008  . GERD 06/17/2007  . PEPTIC ULCER DISEASE 06/17/2007  . DIVERTICULOSIS, COLON 06/17/2007  . Alcoholic cirrhosis of liver 06/17/2007  . RENAL INSUFFICIENCY 09/18/2008  . OSTEOARTHRITIS, KNEES, BILATERAL 06/19/2009  . LOW BACK PAIN, CHRONIC 12/17/2009  . OSTEOPOROSIS 06/17/2007  . PSA, INCREASED 09/18/2008  . ECHOCARDIOGRAM, ABNORMAL 07/06/2008  . COMPRESSION FRACTURE, LUMBAR VERTEBRAE 08/09/2009  . Adenomatous colon polyp 01/1984  . Impaired glucose tolerance 12/13/2010  . Tubulovillous adenoma 04/2000    Duodenal polyp  . Internal hemorrhoids   . Hiatal hernia    Past Surgical History  Procedure Date  . S/p bowel obstruction surgury 12/01  . S/p bilroth ii   .  Cholecystectomy 04/2000  . Lumbar laminectomy     x 2  . Inguinal herniorrhapy   . S/p left middle ear surgury late 80's    Dr. Ann Maki    reports that he quit smoking about 7 years ago. He has never used smokeless tobacco. He reports that he does not drink alcohol or use illicit drugs. family history includes Diabetes in his other. Allergies  Allergen Reactions  . Amoxicillin-Pot Clavulanate     REACTION: rash  . Ibuprofen     REACTION: itching   Current Outpatient Prescriptions on File Prior to Visit  Medication Sig Dispense Refill  . aspirin 81 MG tablet Take 81 mg by mouth daily.        . Multiple Vitamin (MULTIVITAMINS PO) Take by mouth.        . pantoprazole (PROTONIX) 20 MG tablet Take 1 tablet (20 mg total) by mouth 2 (two) times daily.  60 tablet  5  . pravastatin (PRAVACHOL) 40 MG tablet Take 40 mg by mouth daily.        . sucralfate (CARAFATE) 1 GM/10ML suspension Take 10 mLs (1 g total) by mouth 3 (three) times daily before meals.  420 mL  11   Review of Systems Review of Systems  Constitutional: Negative for diaphoresis HENT: Negative for drooling and tinnitus.   Eyes: Negative for photophobia and visual disturbance.  Respiratory: Negative for choking and stridor.   Gastrointestinal: Negative for vomiting and blood in stool.  Genitourinary: Negative for hematuria and decreased urine volume.  Musculoskeletal: Neg for joint swelling.  Skin: Negative  for color change and wound.  Neurological: Negative for tremors and numbness.    Objective:   Physical Exam BP 128/60  Pulse 57  Temp 97 F (36.1 C) (Oral)  Ht 6' (1.829 m)  Wt 119 lb 6 oz (54.148 kg)  BMI 16.19 kg/m2  SpO2 97% Physical Exam  VS noted, bright today, upbeat Constitutional: Pt appears well-developed and well-nourished.  HENT: Head: Normocephalic.  Right Ear: External ear normal.  Left Ear: External ear normal.  Eyes: Conjunctivae and EOM are normal. Pupils are equal, round, and reactive to  light.  Neck: Normal range of motion. Neck supple.  Cardiovascular: Normal rate and regular rhythm.   Pulmonary/Chest: Effort normal and breath sounds normal.  Abd:  Soft, NT, non-distended, + BS Neurological: Pt is alert. Not confused.  Skin: Skin is warm. No erythema. No ulcers Psychiatric: Pt behavior is normal. Thought content normal for age.     Assessment & Plan:

## 2012-01-30 ENCOUNTER — Encounter: Payer: Self-pay | Admitting: Internal Medicine

## 2012-01-30 DIAGNOSIS — G8929 Other chronic pain: Secondary | ICD-10-CM | POA: Insufficient documentation

## 2012-01-30 NOTE — Assessment & Plan Note (Addendum)
Recent CT neg except for bladder thickening; pain resolved since last visit,  to f/u any worsening symptoms or concerns

## 2012-01-30 NOTE — Assessment & Plan Note (Signed)
With some breakthrough per pt despite PPI and carafate; had EGD mar 2012 per Dr Sharmon Leyden, will try dexilant 60 mg in samples instead of generic protonix,  to f/u any worsening symptoms or concerns

## 2012-01-30 NOTE — Assessment & Plan Note (Signed)
stable overall by hx and exam, most recent data reviewed with pt, and pt to continue medical treatment as before BP Readings from Last 3 Encounters:  01/27/12 128/60  12/25/11 132/62  10/16/11 146/63

## 2012-01-30 NOTE — Assessment & Plan Note (Signed)
Wt increased 4 lbs with increased calories and appetite it seems, Continue all other medications as before, cont ensure

## 2012-01-30 NOTE — Assessment & Plan Note (Signed)
Lumbar and knees, stable, for med refill ,  to f/u any worsening symptoms or concerns

## 2012-02-03 DIAGNOSIS — N401 Enlarged prostate with lower urinary tract symptoms: Secondary | ICD-10-CM | POA: Diagnosis not present

## 2012-02-03 DIAGNOSIS — C679 Malignant neoplasm of bladder, unspecified: Secondary | ICD-10-CM | POA: Diagnosis not present

## 2012-02-10 ENCOUNTER — Telehealth: Payer: Self-pay

## 2012-02-10 NOTE — Telephone Encounter (Signed)
Patient picked up 2 boxes of Dexilant samples.  MD informed if worked well would need to get patient assistance to help with prescription in the future.

## 2012-03-09 ENCOUNTER — Telehealth: Payer: Self-pay | Admitting: Internal Medicine

## 2012-03-09 NOTE — Telephone Encounter (Signed)
Called the patient left message with his wife to call back 

## 2012-03-09 NOTE — Telephone Encounter (Signed)
I would make f/u appt with Dr Russella Dar

## 2012-03-09 NOTE — Telephone Encounter (Signed)
Dexilant is not working for this patient please advise

## 2012-03-09 NOTE — Telephone Encounter (Signed)
Patient informed. 

## 2012-04-27 DIAGNOSIS — Z23 Encounter for immunization: Secondary | ICD-10-CM | POA: Diagnosis not present

## 2012-05-21 ENCOUNTER — Telehealth: Payer: Self-pay | Admitting: Internal Medicine

## 2012-05-21 MED ORDER — OXYCODONE HCL 5 MG PO TABS: 5.0000 mg | ORAL_TABLET | Freq: Four times a day (QID) | ORAL | Status: DC | PRN

## 2012-05-21 NOTE — Telephone Encounter (Signed)
Done hardcopy to robin  

## 2012-05-21 NOTE — Telephone Encounter (Signed)
Request refill on oxycodone hcl 5mg 

## 2012-05-21 NOTE — Telephone Encounter (Signed)
Patient informed to pickup hardcopy at the front desk. 

## 2012-08-03 ENCOUNTER — Encounter: Payer: Self-pay | Admitting: Internal Medicine

## 2012-08-03 ENCOUNTER — Ambulatory Visit (INDEPENDENT_AMBULATORY_CARE_PROVIDER_SITE_OTHER): Payer: Medicare Other | Admitting: Internal Medicine

## 2012-08-03 ENCOUNTER — Other Ambulatory Visit (INDEPENDENT_AMBULATORY_CARE_PROVIDER_SITE_OTHER): Payer: Medicare Other

## 2012-08-03 VITALS — BP 128/68 | HR 63 | Temp 97.3°F | Ht 72.0 in | Wt 116.5 lb

## 2012-08-03 DIAGNOSIS — N259 Disorder resulting from impaired renal tubular function, unspecified: Secondary | ICD-10-CM | POA: Diagnosis not present

## 2012-08-03 DIAGNOSIS — M545 Low back pain: Secondary | ICD-10-CM | POA: Diagnosis not present

## 2012-08-03 DIAGNOSIS — I10 Essential (primary) hypertension: Secondary | ICD-10-CM | POA: Diagnosis not present

## 2012-08-03 DIAGNOSIS — K219 Gastro-esophageal reflux disease without esophagitis: Secondary | ICD-10-CM

## 2012-08-03 LAB — BASIC METABOLIC PANEL
CO2: 28 mEq/L (ref 19–32)
Calcium: 9.2 mg/dL (ref 8.4–10.5)
Chloride: 104 mEq/L (ref 96–112)
Glucose, Bld: 99 mg/dL (ref 70–99)
Potassium: 4.6 mEq/L (ref 3.5–5.1)
Sodium: 140 mEq/L (ref 135–145)

## 2012-08-03 MED ORDER — OMEPRAZOLE 20 MG PO CPDR: 20.0000 mg | DELAYED_RELEASE_CAPSULE | Freq: Two times a day (BID) | ORAL | Status: DC

## 2012-08-03 MED ORDER — OXYCODONE HCL 5 MG PO TABS: 5.0000 mg | ORAL_TABLET | Freq: Four times a day (QID) | ORAL | Status: DC | PRN

## 2012-08-03 NOTE — Assessment & Plan Note (Signed)
Volume stable, for f/u lab today,  to f/u any worsening symptoms or concerns

## 2012-08-03 NOTE — Assessment & Plan Note (Addendum)
ECG reviewed as per emr,stable overall by hx and exam, most recent data reviewed with pt, and pt to continue medical treatment as before  BP Readings from Last 3 Encounters:  08/03/12 128/68  01/27/12 128/60  12/25/11 132/62

## 2012-08-03 NOTE — Patient Instructions (Addendum)
You are given the refill of the prilosec today Remember, though, that taking the Zegerid OTC twice per day (which is prilosec and TUMS in one pill) may work better Continue all other medications as before You are given the pain medication refill Please have the pharmacy call with any other refills you may need. Please go to LAB in the Basement for the blood and/or urine tests to be done today - just the kidney function today You will be contacted by phone if any changes need to be made immediately.  Otherwise, you will receive a letter about your results with an explanation Please remember to sign up for My Chart at your earliest convenience, as this will be important to you in the future with finding out test results. Please stop at the scheduling desk to schedule your yearly Free Medicare Wellness Exam with Nicki Reaper, Nurse Practioner in the next 1 - 2 months (she is unable to see you today) Please return in 6 months, or sooner if needed, with Dr Jonny Ruiz

## 2012-08-03 NOTE — Assessment & Plan Note (Addendum)
stable overall by hx and exam, most recent data reviewed with pt, and pt to continue medical treatment as before, for pain med refill today  Note:  Total time for pt hx, exam, review of record with pt in the room, determination of diagnoses and plan for further eval and tx is > 40 min, with over 50% spent in coordination and counseling of patient

## 2012-08-03 NOTE — Assessment & Plan Note (Signed)
Ok to try change of protonix to prilosec, pt to consider zegerid otc bid as alternative

## 2012-08-03 NOTE — Progress Notes (Signed)
Subjective:    Patient ID: Sean [name], male    DOB: [birth year], 79 y.o.   MRN: [id number]  HPI  Here frustrated with ongoing reflux symptoms but Denies worsening dysphagia, abd pain, n/v, bowel change or blood.  Oral med working suboptimal, got letter from [employer] stating he will pay 50% cost of protonix, but prilosec is free.  TUMS also helps otc.  Took the carafate about 4 mo last yr but stopped as did not seem to help. Has has EGD. Pt denies chest pain, increased sob or doe, wheezing, orthopnea, PND, increased LE swelling, palpitations, dizziness or syncope, other than above.  Pt continues to have recurring LBP without change in severity, bowel or bladder change, fever, wt loss,  worsening LE pain/numbness/weakness, gait change or falls.  Pt denies new neurological symptoms such as new headache, or facial or extremity weakness or numbness   Pt denies polydipsia, polyuria Past Medical History  Diagnosis Date  . BLADDER CANCER 06/17/2007  . VITAMIN D DEFICIENCY 09/18/2008  . GLUCOSE INTOLERANCE 06/17/2007  . HYPERLIPIDEMIA 06/17/2007  . ABUSE, ALCOHOL, IN REMISSION 06/17/2007  . UNSPECIFIED SUDDEN HEARING LOSS 03/20/2009  . HYPERTENSION 06/17/2007  . CAROTID ARTERY DISEASE 07/23/2009  . COPD 09/18/2008  . GERD 06/17/2007  . PEPTIC ULCER DISEASE 06/17/2007  . DIVERTICULOSIS, COLON 06/17/2007  . Alcoholic cirrhosis of liver 06/17/2007  . RENAL INSUFFICIENCY 09/18/2008  . OSTEOARTHRITIS, KNEES, BILATERAL 06/19/2009  . LOW BACK PAIN, CHRONIC 12/17/2009  . OSTEOPOROSIS 06/17/2007  . PSA, INCREASED 09/18/2008  . ECHOCARDIOGRAM, ABNORMAL 07/06/2008  . COMPRESSION FRACTURE, LUMBAR VERTEBRAE 08/09/2009  . Adenomatous colon polyp 01/1984  . Impaired glucose tolerance 12/13/2010  . 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 Internal hemorrhoids   . Hiatal hernia    Past Surgical History  Procedure Date  . S/p bowel obstruction surgury 12/01  . S/p bilroth ii   .  Cholecystectomy 04/2000  . Lumbar laminectomy     x 2  . Inguinal herniorrhapy   . S/p left middle ear surgury late 80's    Dr. Ann Maki    reports that he quit smoking about 7 years ago. He has never used smokeless tobacco. He reports that he does not drink alcohol or use illicit drugs. family history includes Diabetes in his other. Allergies  Allergen Reactions  . Amoxicillin-Pot Clavulanate     REACTION: rash  . Ibuprofen     REACTION: itching   Current Outpatient Prescriptions on File Prior to Visit  Medication Sig Dispense Refill  . aspirin 81 MG tablet Take 81 mg by mouth daily.        . Multiple Vitamin (MULTIVITAMINS PO) Take by mouth.        . pravastatin (PRAVACHOL) 40 MG tablet Take 40 mg by mouth daily.        Marland Kitchen omeprazole (PRILOSEC) 20 MG capsule Take 1 capsule (20 mg total) by mouth 2 (two) times daily.  180 capsule  3   Review of Systems  Constitutional: Negative for diaphoresis and unexpected weight change.  HENT: Negative for tinnitus.   Eyes: Negative for photophobia and visual disturbance.  Respiratory: Negative for choking and stridor.   Gastrointestinal: Negative for vomiting and blood in stool.  Genitourinary: Negative for hematuria and decreased urine volume.  Musculoskeletal: Negative for gait problem.  Skin: Negative for color change and wound.  Neurological: Negative for tremors and numbness.  Psychiatric/Behavioral: Negative for decreased concentration. The patient is not hyperactive.  Objective:   Physical Exam BP 128/68  Pulse 63  Temp 97.3 F (36.3 C) (Oral)  Ht 6' (1.829 m)  Wt 116 lb 8 oz (52.844 kg)  BMI 15.80 kg/m2  SpO2 99% Physical Exam  VS noted Constitutional: Pt appears well-developed and well-nourished.  HENT: Head: Normocephalic.  Right Ear: External ear normal.  Left Ear: External ear normal.  Eyes: Conjunctivae and EOM are normal. Pupils are equal, round, and reactive to light.  Neck: Normal range of motion. Neck  supple.  Cardiovascular: Normal rate and regular rhythm.   Pulmonary/Chest: Effort normal and breath sounds normal.  Abd:  Soft, NT, non-distended, + BS Neurological: Pt is alert. Not confused , motor intact Skin: Skin is warm. No erythema.  Psychiatric: Pt behavior is normal. Thought content normal.     Assessment & Plan:

## 2012-08-11 ENCOUNTER — Telehealth: Payer: Self-pay | Admitting: Internal Medicine

## 2012-08-11 ENCOUNTER — Other Ambulatory Visit: Payer: Self-pay | Admitting: Internal Medicine

## 2012-08-11 MED ORDER — OMEPRAZOLE MAGNESIUM 20 MG PO TBEC: DELAYED_RELEASE_TABLET | ORAL | Status: DC

## 2012-08-11 NOTE — Telephone Encounter (Signed)
error 

## 2012-08-11 NOTE — Telephone Encounter (Signed)
Patient informed to pickup hardcopy at the front desk. 

## 2012-08-11 NOTE — Telephone Encounter (Signed)
Done per pt reqeust - to send by mail

## 2012-08-12 ENCOUNTER — Other Ambulatory Visit: Payer: Self-pay | Admitting: Internal Medicine

## 2012-08-12 MED ORDER — OMEPRAZOLE MAGNESIUM 20 MG PO TBEC: DELAYED_RELEASE_TABLET | ORAL | Status: DC

## 2012-08-16 DIAGNOSIS — H35379 Puckering of macula, unspecified eye: Secondary | ICD-10-CM | POA: Diagnosis not present

## 2012-08-16 DIAGNOSIS — H353290 Exudative age-related macular degeneration, unspecified eye, stage unspecified: Secondary | ICD-10-CM | POA: Diagnosis not present

## 2012-08-16 DIAGNOSIS — H43819 Vitreous degeneration, unspecified eye: Secondary | ICD-10-CM | POA: Diagnosis not present

## 2012-08-16 DIAGNOSIS — H353190 Nonexudative age-related macular degeneration, unspecified eye, stage unspecified: Secondary | ICD-10-CM | POA: Diagnosis not present

## 2012-09-09 ENCOUNTER — Encounter: Payer: Medicare Other | Admitting: Internal Medicine

## 2012-09-14 ENCOUNTER — Ambulatory Visit (INDEPENDENT_AMBULATORY_CARE_PROVIDER_SITE_OTHER): Payer: Medicare Other | Admitting: Internal Medicine

## 2012-09-14 ENCOUNTER — Encounter: Payer: Self-pay | Admitting: Internal Medicine

## 2012-09-14 VITALS — BP 142/78 | HR 71 | Temp 96.7°F | Ht 72.0 in | Wt 119.6 lb

## 2012-09-14 DIAGNOSIS — Z0000 Encounter for general adult medical examination without abnormal findings: Secondary | ICD-10-CM

## 2012-09-14 NOTE — Patient Instructions (Signed)
Health Maintenance, Males A healthy lifestyle and preventative care can promote health and wellness.  Maintain regular health, dental, and eye exams.  Eat a healthy diet. Foods like vegetables, fruits, whole grains, low-fat dairy products, and lean protein foods contain the nutrients you need without too many calories. Decrease your intake of foods high in solid fats, added sugars, and salt. Get information about a proper diet from your caregiver, if necessary.  Regular physical exercise is one of the most important things you can do for your health. Most adults should get at least 150 minutes of moderate-intensity exercise (any activity that increases your heart rate and causes you to sweat) each week. In addition, most adults need muscle-strengthening exercises on 2 or more days a week.   Maintain a healthy weight. The body mass index (BMI) is a screening tool to identify possible weight problems. It provides an estimate of body fat based on height and weight. Your caregiver can help determine your BMI, and can help you achieve or maintain a healthy weight. For adults 20 years and older:  A BMI below 18.5 is considered underweight.  A BMI of 18.5 to 24.9 is normal.  A BMI of 25 to 29.9 is considered overweight.  A BMI of 30 and above is considered obese.  Maintain normal blood lipids and cholesterol by exercising and minimizing your intake of saturated fat. Eat a balanced diet with plenty of fruits and vegetables. Blood tests for lipids and cholesterol should begin at age 20 and be repeated every 5 years. If your lipid or cholesterol levels are high, you are over 50, or you are a high risk for heart disease, you may need your cholesterol levels checked more frequently.Ongoing high lipid and cholesterol levels should be treated with medicines, if diet and exercise are not effective.  If you smoke, find out from your caregiver how to quit. If you do not use tobacco, do not start.  If you  choose to drink alcohol, do not exceed 2 drinks per day. One drink is considered to be 12 ounces (355 mL) of beer, 5 ounces (148 mL) of wine, or 1.5 ounces (44 mL) of liquor.  Avoid use of street drugs. Do not share needles with anyone. Ask for help if you need support or instructions about stopping the use of drugs.  High blood pressure causes heart disease and increases the risk of stroke. Blood pressure should be checked at least every 1 to 2 years. Ongoing high blood pressure should be treated with medicines if weight loss and exercise are not effective.  If you are 45 to 77 years old, ask your caregiver if you should take aspirin to prevent heart disease.  Diabetes screening involves taking a blood sample to check your fasting blood sugar level. This should be done once every 3 years, after age 45, if you are within normal weight and without risk factors for diabetes. Testing should be considered at a younger age or be carried out more frequently if you are overweight and have at least 1 risk factor for diabetes.  Colorectal cancer can be detected and often prevented. Most routine colorectal cancer screening begins at the age of 50 and continues through age 75. However, your caregiver may recommend screening at an earlier age if you have risk factors for colon cancer. On a yearly basis, your caregiver may provide home test kits to check for hidden blood in the stool. Use of a small camera at the end of a tube,   to directly examine the colon (sigmoidoscopy or colonoscopy), can detect the earliest forms of colorectal cancer. Talk to your caregiver about this at age 50, when routine screening begins. Direct examination of the colon should be repeated every 5 to 10 years through age 75, unless early forms of pre-cancerous polyps or small growths are found.  Hepatitis C blood testing is recommended for all people born from 1945 through 1965 and any individual with known risks for hepatitis C.  Healthy  men should no longer receive prostate-specific antigen (PSA) blood tests as part of routine cancer screening. Consult with your caregiver about prostate cancer screening.  Testicular cancer screening is not recommended for adolescents or adult males who have no symptoms. Screening includes self-exam, caregiver exam, and other screening tests. Consult with your caregiver about any symptoms you have or any concerns you have about testicular cancer.  Practice safe sex. Use condoms and avoid high-risk sexual practices to reduce the spread of sexually transmitted infections (STIs).  Use sunscreen with a sun protection factor (SPF) of 30 or greater. Apply sunscreen liberally and repeatedly throughout the day. You should seek shade when your shadow is shorter than you. Protect yourself by wearing long sleeves, pants, a wide-brimmed hat, and sunglasses year round, whenever you are outdoors.  Notify your caregiver of new moles or changes in moles, especially if there is a change in shape or color. Also notify your caregiver if a mole is larger than the size of a pencil eraser.  A one-time screening for abdominal aortic aneurysm (AAA) and surgical repair of large AAAs by sound wave imaging (ultrasonography) is recommended for ages 65 to 75 years who are current or former smokers.  Stay current with your immunizations. Document Released: 01/10/2008 Document Revised: 10/06/2011 Document Reviewed: 12/09/2010 [employer] Patient Information 2013 [employer].  

## 2012-09-14 NOTE — Progress Notes (Signed)
HPI:  Pt presents to the clinic for his Medicare Wellness Visit. He has no concerns today   Past Medical History  Diagnosis Date  . BLADDER CANCER 06/17/2007  . VITAMIN D DEFICIENCY 09/18/2008  . GLUCOSE INTOLERANCE 06/17/2007  . HYPERLIPIDEMIA 06/17/2007  . ABUSE, ALCOHOL, IN REMISSION 06/17/2007  . UNSPECIFIED SUDDEN HEARING LOSS 03/20/2009  . HYPERTENSION 06/17/2007  . CAROTID ARTERY DISEASE 07/23/2009  . COPD 09/18/2008  . GERD 06/17/2007  . PEPTIC ULCER DISEASE 06/17/2007  . DIVERTICULOSIS, COLON 06/17/2007  . Alcoholic cirrhosis of liver 06/17/2007  . RENAL INSUFFICIENCY 09/18/2008  . OSTEOARTHRITIS, KNEES, BILATERAL 06/19/2009  . LOW BACK PAIN, CHRONIC 12/17/2009  . OSTEOPOROSIS 06/17/2007  . PSA, INCREASED 09/18/2008  . ECHOCARDIOGRAM, ABNORMAL 07/06/2008  . COMPRESSION FRACTURE, LUMBAR VERTEBRAE 08/09/2009  . Adenomatous colon polyp 01/1984  . Impaired glucose tolerance 12/13/2010  . 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 Internal hemorrhoids   . Hiatal hernia     Current Outpatient Prescriptions  Medication Sig Dispense Refill  . aspirin 81 MG tablet Take 81 mg by mouth daily.        . Multiple Vitamin (MULTIVITAMINS PO) Take by mouth.        Marland Kitchen omeprazole (PRILOSEC OTC) 20 MG tablet 1 tab by mouth twice per day  168 tablet  3  . oxyCODONE (ROXICODONE) 5 MG immediate release tablet Take 1 tablet (5 mg total) by mouth every 6 (six) hours as needed for pain.  100 tablet  0  . pravastatin (PRAVACHOL) 40 MG tablet Take 40 mg by mouth daily.         No current facility-administered medications for this visit.    Allergies  Allergen Reactions  . Amoxicillin-Pot Clavulanate     REACTION: rash  . Ibuprofen     REACTION: itching    Family History  Problem Relation Age of Onset  . Diabetes Other     5 siblings    History   Social History  . Marital Status: Married    Spouse Name: N/A    Number of Children: 2  . Years of Education: N/A    Occupational History  . 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   . Former 3 years in [place] [employer], prior to that [employer] in [place]    Social History Main Topics  . Smoking status: Former Smoker    Quit date: 10/09/2004  . Smokeless tobacco: Never Used  . Alcohol Use: No  . Drug Use: No  . Sexually Active: Not on file   Other Topics Concern  . Not on file   Social History Narrative  . No narrative on file    Hospitiliaztions: None  Health Maintenance:    Flu: 2013  Tetanus: 2009  Pneumovax: 2009  Zostavax: never  Bone Density: 2009  Colonoscopy: 2008  Eye Doctor: yearly   Dental Exam: dentures   Past Medical History  Diagnosis Date  . BLADDER CANCER 06/17/2007  . VITAMIN D DEFICIENCY 09/18/2008  . GLUCOSE INTOLERANCE 06/17/2007  . HYPERLIPIDEMIA 06/17/2007  . ABUSE, ALCOHOL, IN REMISSION 06/17/2007  . UNSPECIFIED SUDDEN HEARING LOSS 03/20/2009  . HYPERTENSION 06/17/2007  . CAROTID ARTERY DISEASE 07/23/2009  . COPD 09/18/2008  . GERD 06/17/2007  . PEPTIC ULCER DISEASE 06/17/2007  . DIVERTICULOSIS, COLON 06/17/2007  . Alcoholic cirrhosis of liver 06/17/2007  . RENAL INSUFFICIENCY 09/18/2008  . OSTEOARTHRITIS, KNEES, BILATERAL 06/19/2009  . LOW BACK PAIN, CHRONIC 12/17/2009  . OSTEOPOROSIS 06/17/2007  .  PSA, INCREASED 09/18/2008  . ECHOCARDIOGRAM, ABNORMAL 07/06/2008  . COMPRESSION FRACTURE, LUMBAR VERTEBRAE 08/09/2009  . Adenomatous colon polyp 01/1984  . Impaired glucose tolerance 12/13/2010  . 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 Internal hemorrhoids   . Hiatal hernia     Current Outpatient Prescriptions  Medication Sig Dispense Refill  . aspirin 81 MG tablet Take 81 mg by mouth daily.        . Multiple Vitamin (MULTIVITAMINS PO) Take by mouth.        Marland Kitchen omeprazole (PRILOSEC OTC) 20 MG tablet 1 tab by mouth twice per day  168 tablet  3  . oxyCODONE (ROXICODONE) 5 MG immediate release tablet Take 1 tablet (5 mg total) by  mouth every 6 (six) hours as needed for pain.  100 tablet  0  . pravastatin (PRAVACHOL) 40 MG tablet Take 40 mg by mouth daily.         No current facility-administered medications for this visit.    Allergies  Allergen Reactions  . Amoxicillin-Pot Clavulanate     REACTION: rash  . Ibuprofen     REACTION: itching    Family History  Problem Relation Age of Onset  . Diabetes Other     5 siblings    History   Social History  . Marital Status: Married    Spouse Name: N/A    Number of Children: 2  . Years of Education: N/A   Occupational History  . 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   . Former 3 years in [place] [employer], prior to that [employer] in [place]    Social History Main Topics  . Smoking status: Former Smoker    Quit date: 10/09/2004  . Smokeless tobacco: Never Used  . Alcohol Use: No  . Drug Use: No  . Sexually Active: Not on file   Other Topics Concern  . Not on file   Social History Narrative  . No narrative on file    I have personally reviewed and have noted: 1. The patient's medical and social history 2. Their use of alcohol, tobacco or illicit drugs 3. Their current medications and supplements 4. The patient's functional ability including ADL's, fall risks, home safety risks and   hearing or visual impairment. 5. Diet and physical activities 6. Evidence for depression or mood disorders Subjective:   Review of Systems:   Constitutional: Denies fever, malaise, fatigue, headache or abrupt weight changes.  HEENT: Denies eye pain, eye redness, ear pain, ringing in the ears, wax buildup, runny nose, nasal congestion, bloody nose, or sore throat. Respiratory: Denies difficulty breathing, shortness of breath, cough or sputum production.   Cardiovascular: Denies chest pain, chest tightness, palpitations or swelling in the hands or feet.  Gastrointestinal: Pt reports constipation. Denies abdominal pain, bloating, diarrhea or blood in the stool.   GU: Denies urgency, frequency, pain with urination, burning sensation, blood in urine, odor or discharge. Musculoskeletal: Pt reports back pain. Denies decrease in range of motion, difficulty with gait, muscle pain or joint pain and swelling.  Skin: Denies redness, rashes, lesions or ulcercations.  Neurological: Denies dizziness, difficulty with memory, difficulty with speech or problems with balance and coordination.   No other specific complaints in a complete review of systems (except as listed in HPI above).  Objective:  PE:   BP 142/78  Pulse 71  Temp(Src) 96.7 F (35.9 C) (Oral)  Ht 6' (1.829 m)  Wt 119 lb 9.6 oz (54.25  kg)  BMI 16.22 kg/m2  SpO2 98% Wt Readings from Last 3 Encounters:  09/14/12 119 lb 9.6 oz (54.25 kg)  08/03/12 116 lb 8 oz (52.844 kg)  01/27/12 119 lb 6 oz (54.148 kg)    General: Appears his stated age, well developed, well nourished in NAD. Skin: Warm, dry and intact. No rashes, lesions or ulcerations noted. HEENT: Head: normal shape and size; Eyes: sclera injected, no icterus, conjunctiva pink, PERRLA and EOMs intact; Ears: Tm's gray and intact, normal light reflex; Nose: mucosa pink and moist, septum midline; Throat/Mouth: Teeth present, mucosa pink and moist, no exudate, lesions or ulcerations noted.  Neck: Normal range of motion. Neck supple, trachea midline. No massses, lumps or thyromegaly present.  Cardiovascular: Normal rate and rhythm. S1,S2 noted.  No murmur, rubs or gallops noted. No JVD or BLE edema. No carotid bruits noted. Pulmonary/Chest: Normal effort and positive vesicular breath sounds. No respiratory distress. No wheezes, rales or ronchi noted.  Abdomen: Soft and nontender. Normal bowel sounds, no bruits noted. No distention or masses noted. Liver, spleen and kidneys non palpable. Musculoskeletal: Normal range of motion. No signs of joint swelling. No difficulty with gait.  Neurological: Alert and oriented. Cranial nerves II-XII intact.  Coordination normal. +DTRs bilaterally. Psychiatric: Mood and affect normal. Behavior is normal. Judgment and thought content normal.   EKG:  BMET    Component Value Date/Time   NA 140 08/03/2012 1449   K 4.6 08/03/2012 1449   CL 104 08/03/2012 1449   CO2 28 08/03/2012 1449   GLUCOSE 99 08/03/2012 1449   BUN 28* 08/03/2012 1449   CREATININE 1.2 08/03/2012 1449   CALCIUM 9.2 08/03/2012 1449   GFRNONAA 51.42 06/17/2010 1516   GFRAA  Value: 57        The eGFR has been calculated using the MDRD equation. This calculation has not been validated in all clinical situations. eGFR's persistently <60 mL/min signify possible Chronic Kidney Disease.* 08/23/2009 1630    Lipid Panel     Component Value Date/Time   CHOL 155 06/18/2011 1513   TRIG 106.0 06/18/2011 1513   HDL 47.00 06/18/2011 1513   CHOLHDL 3 06/18/2011 1513   VLDL 21.2 06/18/2011 1513   LDLCALC 87 06/18/2011 1513    CBC    Component Value Date/Time   WBC 6.4 12/25/2011 1433   RBC 3.61* 12/25/2011 1433   HGB 12.0* 12/25/2011 1433   HCT 36.1* 12/25/2011 1433   PLT 168.0 12/25/2011 1433   MCV 99.9 12/25/2011 1433   MCHC 33.2 12/25/2011 1433   RDW 13.2 12/25/2011 1433   LYMPHSABS 1.8 12/25/2011 1433   MONOABS 0.6 12/25/2011 1433   EOSABS 0.1 12/25/2011 1433   BASOSABS 0.0 12/25/2011 1433    Hgb A1C Lab Results  Component Value Date   HGBA1C 5.6 12/25/2011      Assessment and Plan:   Medicare Annual Wellness Visit:  Diet: Heart healthy  Physical activity: Sedentary Depression/mood screen: Negative Hearing: Intact to whispered voice Visual acuity: Grossly normal, performs annual eye exam  ADLs: Capable Fall risk: Minimal, use cane to walk Home safety: Good Cognitive evaluation: Intact to orientation, naming, recall and repetition EOL planning:  No Adv directives, full code/ I agree  Preventative Medicine:  All HM UTD  Next appointment: May 2014

## 2012-10-20 ENCOUNTER — Encounter (INDEPENDENT_AMBULATORY_CARE_PROVIDER_SITE_OTHER): Payer: Medicare Other

## 2012-10-20 DIAGNOSIS — I6529 Occlusion and stenosis of unspecified carotid artery: Secondary | ICD-10-CM

## 2012-11-02 ENCOUNTER — Telehealth: Payer: Self-pay | Admitting: Cardiovascular Disease

## 2012-11-02 ENCOUNTER — Encounter: Payer: Self-pay | Admitting: *Deleted

## 2012-11-02 NOTE — Telephone Encounter (Signed)
PT AWARE OF CAROTID RESULTS ./CY 

## 2012-11-02 NOTE — Telephone Encounter (Signed)
New problem     Pt returning call to get results

## 2012-12-08 ENCOUNTER — Telehealth: Payer: Self-pay | Admitting: Internal Medicine

## 2012-12-08 MED ORDER — OXYCODONE HCL 5 MG PO TABS: 5.0000 mg | ORAL_TABLET | Freq: Four times a day (QID) | ORAL | Status: DC | PRN

## 2012-12-08 NOTE — Telephone Encounter (Signed)
Patient would like refill for oxycodone HCL 5mg .

## 2012-12-08 NOTE — Telephone Encounter (Signed)
Done hardcopy to robin  

## 2012-12-09 NOTE — Telephone Encounter (Signed)
Pt notified he can pick up his Oxycodone script

## 2012-12-27 ENCOUNTER — Encounter: Payer: Self-pay | Admitting: Gastroenterology

## 2012-12-27 ENCOUNTER — Ambulatory Visit (INDEPENDENT_AMBULATORY_CARE_PROVIDER_SITE_OTHER): Payer: Medicare Other | Admitting: Gastroenterology

## 2012-12-27 VITALS — BP 136/60 | HR 60 | Ht 72.0 in | Wt 114.6 lb

## 2012-12-27 DIAGNOSIS — K219 Gastro-esophageal reflux disease without esophagitis: Secondary | ICD-10-CM

## 2012-12-27 DIAGNOSIS — R143 Flatulence: Secondary | ICD-10-CM

## 2012-12-27 DIAGNOSIS — K5900 Constipation, unspecified: Secondary | ICD-10-CM | POA: Diagnosis not present

## 2012-12-27 DIAGNOSIS — IMO0001 Reserved for inherently not codable concepts without codable children: Secondary | ICD-10-CM

## 2012-12-27 DIAGNOSIS — R141 Gas pain: Secondary | ICD-10-CM | POA: Diagnosis not present

## 2012-12-27 MED ORDER — METOCLOPRAMIDE HCL 5 MG PO TABS: 5.0000 mg | ORAL_TABLET | Freq: Three times a day (TID) | ORAL | Status: DC

## 2012-12-27 NOTE — Patient Instructions (Addendum)
We have sent the following medications to your pharmacy for you to pick up at your convenience: Metoclopramide Please use Gas-X before meals and at bedtime.  CC:  Oliver Barre MD

## 2012-12-27 NOTE — Progress Notes (Addendum)
History of Present Illness: This is an 79 year old male who relates problems with postprandial gas, belching, regurgitation and heartburn. He underwent upper endoscopy in 11/2011 for the same symptoms showing normal postoperative anatomy with a prior gastrectomy. He's been treated with PPIs for years but they no longer are effective. He complains of weight loss however his weight is stable compared to last year at this time. We tried Carafate last year with no improvement in symptoms. More recently Dr. Jonny Ruiz tried on pantoprazole and Dexilant with no change in symptoms. The patient notes some relief with when necessary use of TUMS. He has occasional constipation.   Current Medications, Allergies, Past Medical History, Past Surgical History, Family History and Social History were reviewed in [employer] record.  Physical Exam: General: Well developed , thin, elderly no acute distress Head: Normocephalic and atraumatic Eyes:  sclerae anicteric, EOMI Ears: Normal auditory acuity Mouth: No deformity or lesions Lungs: Clear throughout to auscultation Heart: Regular rate and rhythm; no murmurs, rubs or bruits Abdomen: Soft, non tender and non distended. No masses, hepatosplenomegaly or hernias noted. Normal Bowel sounds Musculoskeletal: Symmetrical with no gross deformities  Pulses:  Normal pulses noted Extremities: No clubbing, cyanosis, edema or deformities noted Neurological: Alert oriented x 4, grossly nonfocal Psychological:  Alert and cooperative. Normal mood and affect  Assessment and Recommendations:  1. GERD. Given his prior gastric surgery he may not produce significant amounts of acid. He may therefore be experiencing non-acid reflux symptoms and/or gastroparesis. Carafate was not effective in the past. Begin metoclopramide 5 mg a.c. and at bedtime and Gas-X a.c. and at bedtime. Continue TUMS prn. Intensify antireflux measures. Consider a gastric emptying scan if his  symptoms do not respond. Return office visit 6 weeks.  2. Constipation. Minimize pain medication usage. MiraLax once or twice daily titrated for adequate bowel movements.

## 2012-12-28 DIAGNOSIS — H6120 Impacted cerumen, unspecified ear: Secondary | ICD-10-CM | POA: Diagnosis not present

## 2012-12-28 DIAGNOSIS — H905 Unspecified sensorineural hearing loss: Secondary | ICD-10-CM | POA: Diagnosis not present

## 2013-01-31 ENCOUNTER — Ambulatory Visit (INDEPENDENT_AMBULATORY_CARE_PROVIDER_SITE_OTHER): Payer: Medicare Other | Admitting: Gastroenterology

## 2013-01-31 ENCOUNTER — Encounter: Payer: Self-pay | Admitting: Gastroenterology

## 2013-01-31 VITALS — BP 140/58 | HR 64 | Ht 72.0 in | Wt 113.0 lb

## 2013-01-31 DIAGNOSIS — K219 Gastro-esophageal reflux disease without esophagitis: Secondary | ICD-10-CM

## 2013-01-31 MED ORDER — OMEPRAZOLE-SODIUM BICARBONATE 40-1100 MG PO CAPS: 1.0000 | ORAL_CAPSULE | Freq: Every day | ORAL | Status: DC

## 2013-01-31 NOTE — Patient Instructions (Addendum)
Stop taking your Reglan and Prilosec. Start Zegerid samples one tablet by mouth once daily before breakfast x 2 weeks. Use Tums as needed also.   Call back after samples are finished and let us know if you would like a prescription of Zegerid.   Thank you for choosing me and [facility].  Venita Lick. Pleas Koch., MD., Clementeen Graham

## 2013-01-31 NOTE — Progress Notes (Signed)
History of Present Illness: This is an 79 year old male who relates persistent postprandial burning in his upper chest. The symptoms have not been relieved with a trial of several PPIs. He does not note any benefit from metoclopramide. Carafate has not been effective in the past. TUMS provides temporary relief.  Current Medications, Allergies, Past Medical History, Past Surgical History, Family History and Social History were reviewed in [employer] record.  Physical Exam: General: Well developed , well nourished, no acute distress Head: Normocephalic and atraumatic Eyes:  sclerae anicteric, EOMI Ears: Normal auditory acuity Mouth: No deformity or lesions Lungs: Clear throughout to auscultation Heart: Regular rate and rhythm; no murmurs, rubs or bruits Abdomen: Soft, non tender and non distended. No masses, hepatosplenomegaly or hernias noted. Normal Bowel sounds Musculoskeletal: Symmetrical with no gross deformities  Pulses:  Normal pulses noted Extremities: No clubbing, cyanosis, edema or deformities noted Neurological: Alert oriented x 4, grossly nonfocal Psychological:  Alert and cooperative. Normal mood and affect  Assessment and Recommendations:  1. GERD. Prior Billroth II. His symptoms have not responded. Discontinue omeprazole. Discontinue metoclopramide. Trial of Zegerid 40 mg twice a day and continue TUMS prn. Continue standard antireflux measures. Consider a trial of ranitidine 300 mg twice a day if Zegerid is not effective.

## 2013-02-01 ENCOUNTER — Other Ambulatory Visit (INDEPENDENT_AMBULATORY_CARE_PROVIDER_SITE_OTHER): Payer: Medicare Other

## 2013-02-01 ENCOUNTER — Encounter: Payer: Self-pay | Admitting: Internal Medicine

## 2013-02-01 ENCOUNTER — Ambulatory Visit (INDEPENDENT_AMBULATORY_CARE_PROVIDER_SITE_OTHER): Payer: Medicare Other | Admitting: Internal Medicine

## 2013-02-01 ENCOUNTER — Ambulatory Visit (INDEPENDENT_AMBULATORY_CARE_PROVIDER_SITE_OTHER)
Admission: RE | Admit: 2013-02-01 | Discharge: 2013-02-01 | Disposition: A | Discharge status: Home / Self Care | Payer: Medicare Other | Source: Ambulatory Visit | Attending: Internal Medicine | Admitting: Internal Medicine

## 2013-02-01 VITALS — BP 120/70 | HR 61 | Temp 97.2°F | Wt 112.1 lb

## 2013-02-01 DIAGNOSIS — M549 Dorsalgia, unspecified: Secondary | ICD-10-CM

## 2013-02-01 DIAGNOSIS — I10 Essential (primary) hypertension: Secondary | ICD-10-CM | POA: Diagnosis not present

## 2013-02-01 DIAGNOSIS — G609 Hereditary and idiopathic neuropathy, unspecified: Secondary | ICD-10-CM | POA: Diagnosis not present

## 2013-02-01 DIAGNOSIS — R7309 Other abnormal glucose: Secondary | ICD-10-CM | POA: Diagnosis not present

## 2013-02-01 DIAGNOSIS — R7302 Impaired glucose tolerance (oral): Secondary | ICD-10-CM

## 2013-02-01 DIAGNOSIS — G629 Polyneuropathy, unspecified: Secondary | ICD-10-CM

## 2013-02-01 DIAGNOSIS — F4321 Adjustment disorder with depressed mood: Secondary | ICD-10-CM

## 2013-02-01 DIAGNOSIS — F4320 Adjustment disorder, unspecified: Secondary | ICD-10-CM | POA: Insufficient documentation

## 2013-02-01 DIAGNOSIS — S32009A Unspecified fracture of unspecified lumbar vertebra, initial encounter for closed fracture: Secondary | ICD-10-CM | POA: Diagnosis not present

## 2013-02-01 LAB — URINALYSIS, ROUTINE W REFLEX MICROSCOPIC
Bilirubin Urine: NEGATIVE
Hgb urine dipstick: NEGATIVE
Urine Glucose: NEGATIVE
Urobilinogen, UA: 0.2 (ref 0.0–1.0)
pH: 6.5 (ref 5.0–8.0)

## 2013-02-01 LAB — CBC WITH DIFFERENTIAL/PLATELET
Basophils Relative: 0.5 % (ref 0.0–3.0)
Eosinophils Relative: 1.1 % (ref 0.0–5.0)
Hemoglobin: 11.7 g/dL — ABNORMAL LOW (ref 13.0–17.0)
Lymphocytes Relative: 24.7 % (ref 12.0–46.0)
MCV: 99 fl (ref 78.0–100.0)
Monocytes Absolute: 0.4 10*3/uL (ref 0.1–1.0)
Neutro Abs: 4.6 10*3/uL (ref 1.4–7.7)
Neutrophils Relative %: 67.3 % (ref 43.0–77.0)
RBC: 3.54 Mil/uL — ABNORMAL LOW (ref 4.22–5.81)
WBC: 6.8 10*3/uL (ref 4.5–10.5)

## 2013-02-01 LAB — BASIC METABOLIC PANEL
Chloride: 105 mEq/L (ref 96–112)
Creatinine, Ser: 1.3 mg/dL (ref 0.4–1.5)
Sodium: 141 mEq/L (ref 135–145)

## 2013-02-01 LAB — LIPID PANEL
Cholesterol: 151 mg/dL (ref 0–200)
HDL: 51.5 mg/dL (ref >39.00)
LDL Cholesterol: 83 mg/dL (ref 0–99)
Total CHOL/HDL Ratio: 3
Triglycerides: 83 mg/dL (ref 0.0–149.0)
VLDL: 16.6 mg/dL (ref 0.0–40.0)

## 2013-02-01 LAB — HEPATIC FUNCTION PANEL
Albumin: 3.7 g/dL (ref 3.5–5.2)
Alkaline Phosphatase: 92 U/L (ref 39–117)
Total Protein: 7.2 g/dL (ref 6.0–8.3)

## 2013-02-01 MED ORDER — GABAPENTIN 100 MG PO CAPS: 100.0000 mg | ORAL_CAPSULE | Freq: Three times a day (TID) | ORAL | Status: DC

## 2013-02-01 NOTE — Progress Notes (Signed)
Subjective:    Patient ID: Sean [name], male    DOB: April 27, 1925, 79 y.o.   MRN: [id number]  HPI  Here to f/u; overall doing ok,  Pt denies chest pain, increased sob or doe, wheezing, orthopnea, PND, increased LE swelling, palpitations, dizziness or syncope.  Pt denies polydipsia, polyuria, or low sugar symptoms such as weakness or confusion improved with po intake.  Pt denies new neurological symptoms such as new headache, or facial or extremity weakness or numbness.   Pt states overall good compliance with meds, has been trying to follow lower cholesterol diet, with wt overall stable,  but little exercise however.  Son/smoker died with lung ca at 63yo last month. Denies worsening depressive symptoms, suicidal ideation, or panic; has ongoing anxiety.  Saw Gi/dr Russella Dar yesterday, with med adjustment.  No recent falls, walks with cane.  Pt continues to have recurring LBP without change in severity, bowel or bladder change, fever, wt loss,  worsening LE pain/numbness/weakness, gait change or falls, but slipped in BR last wk and sat hard on the commode and hit lower back on the tank, asks for films. Has oxycodone for pain. Asks for trial gabapentin for neuropathic pain as it helped his brother. Past Medical History  Diagnosis Date  . BLADDER CANCER 06/17/2007  . VITAMIN D DEFICIENCY 09/18/2008  . GLUCOSE INTOLERANCE 06/17/2007  . HYPERLIPIDEMIA 06/17/2007  . ABUSE, ALCOHOL, IN REMISSION 06/17/2007  . UNSPECIFIED SUDDEN HEARING LOSS 03/20/2009  . HYPERTENSION 06/17/2007  . CAROTID ARTERY DISEASE 07/23/2009  . COPD 09/18/2008  . GERD 06/17/2007  . PEPTIC ULCER DISEASE 06/17/2007  . DIVERTICULOSIS, COLON 06/17/2007  . Alcoholic cirrhosis of liver 06/17/2007  . RENAL INSUFFICIENCY 09/18/2008  . OSTEOARTHRITIS, KNEES, BILATERAL 06/19/2009  . LOW BACK PAIN, CHRONIC 12/17/2009  . OSTEOPOROSIS 06/17/2007  . PSA, INCREASED 09/18/2008  . ECHOCARDIOGRAM, ABNORMAL 07/06/2008  . COMPRESSION FRACTURE, LUMBAR  VERTEBRAE 08/09/2009  . Adenomatous colon polyp 01/1984  . Impaired glucose tolerance 12/13/2010  . Tubulovillous adenoma 04/2000    Duodenal polyp  . Internal hemorrhoids   . Hiatal hernia    Past Surgical History  Procedure Laterality Date  . S/p bowel obstruction surgury  12/01  . S/p bilroth ii    . Cholecystectomy  04/2000  . Lumbar laminectomy      x 2  . Inguinal herniorrhapy    . S/p left middle ear surgury  late 80's    Dr. Ann Maki    reports that he quit smoking about 8 years ago. He has never used smokeless tobacco. He reports that he does not drink alcohol or use illicit drugs. family history includes Diabetes in his other. Allergies  Allergen Reactions  . Amoxicillin-Pot Clavulanate     REACTION: rash  . Ibuprofen     REACTION: itching   Current Outpatient Prescriptions on File Prior to Visit  Medication Sig Dispense Refill  . aspirin 81 MG tablet Take 81 mg by mouth daily.        . calcium carbonate (TUMS - DOSED IN MG ELEMENTAL CALCIUM) 500 MG chewable tablet Chew 2 tablets by mouth daily.      . Multiple Vitamin (MULTIVITAMINS PO) Take by mouth.        Marland Kitchen omeprazole-sodium bicarbonate (ZEGERID) 40-1100 MG per capsule Take 1 capsule by mouth daily before breakfast.  15 capsule  0  . oxyCODONE (ROXICODONE) 5 MG immediate release tablet Take 1 tablet (5 mg total) by mouth every 6 (six) hours as needed for pain.  100 tablet  0  . pravastatin (PRAVACHOL) 40 MG tablet Take 40 mg by mouth daily.         No current facility-administered medications on file prior to visit.    Review of Systems  Constitutional: Negative for unexpected weight change, or unusual diaphoresis  HENT: Negative for tinnitus.   Eyes: Negative for photophobia and visual disturbance.  Respiratory: Negative for choking and stridor.   Gastrointestinal: Negative for vomiting and blood in stool.  Genitourinary: Negative for hematuria and decreased urine volume.  Musculoskeletal: Negative for  acute joint swelling Skin: Negative for color change and wound.  Neurological: Negative for tremors and numbness other than noted  Psychiatric/Behavioral: Negative for decreased concentration or  hyperactivity.       Objective:   Physical Exam BP 120/70  Pulse 61  Temp(Src) 97.2 F (36.2 C) (Oral)  Wt 112 lb 2 oz (50.86 kg)  BMI 15.2 kg/m2  SpO2 98% VS noted,  Constitutional: Pt appears well-developed and well-nourished.  HENT: Head: NCAT.  Right Ear: External ear normal.  Left Ear: External ear normal.  Eyes: Conjunctivae and EOM are normal. Pupils are equal, round, and reactive to light.  Neck: Normal range of motion. Neck supple.  Cardiovascular: Normal rate and regular rhythm.   Pulmonary/Chest: Effort normal and breath sounds normal.  Abd:  Soft, NT, non-distended, + BS Spine: tender but not swollen midline approx l1 Neurological: Pt is alert. Not confused , decr sens to LT to distal extremities, walks with cane, wide based, slow Skin: Skin is warm. No erythema.  Psychiatric: Pt behavior is normal. Thought content normal.  Sad today, somewhat dysphoric    Assessment & Plan:

## 2013-02-01 NOTE — Assessment & Plan Note (Signed)
With some tender on exam today , for film

## 2013-02-01 NOTE — Assessment & Plan Note (Signed)
Mild, declines furhter tx or counseling

## 2013-02-01 NOTE — Assessment & Plan Note (Signed)
stable overall by history and exam, recent data reviewed with pt, and pt to continue medical treatment as before,  to f/u any worsening symptoms or concerns BP Readings from Last 3 Encounters:  02/01/13 120/70  01/31/13 140/58  12/27/12 136/60

## 2013-02-01 NOTE — Assessment & Plan Note (Signed)
Ok for trial gabapentin 100 tid, asked pt to call if feels needs more

## 2013-02-01 NOTE — Patient Instructions (Addendum)
Please take all new medication as prescribed- the gabapentin Please continue all other medications as before, and refills have been done if requested. Please have the pharmacy call with any other refills you may need. Please call if you need a higher strength of the gabapentin Please call if you need an anti-depressant medication Please continue your efforts at being more active, low cholesterol diet, and weight control. You are otherwise up to date with prevention measures today.  Please go to the XRAY Department in the Basement (go straight as you get off the elevator) for the x-ray testing  Please go to the LAB in the Basement (turn left off the elevator) for the tests to be done today  You will be contacted by phone if any changes need to be made immediately.  Otherwise, you will receive a letter about your results with an explanation, but please check with MyChart first.  Please remember to sign up for My Chart if you have not done so, as this will be important to you in the future with finding out test results, communicating by private email, and scheduling acute appointments online when needed.  Please return in 6 months, or sooner if needed

## 2013-02-01 NOTE — Assessment & Plan Note (Signed)
stable overall by history and exam, recent data reviewed with pt, and pt to continue medical treatment as before,  to f/u any worsening symptoms or concerns Lab Results  Component Value Date   HGBA1C 5.6 12/25/2011   For f/u lab today

## 2013-02-03 DIAGNOSIS — N401 Enlarged prostate with lower urinary tract symptoms: Secondary | ICD-10-CM | POA: Diagnosis not present

## 2013-02-03 DIAGNOSIS — C679 Malignant neoplasm of bladder, unspecified: Secondary | ICD-10-CM | POA: Diagnosis not present

## 2013-02-17 ENCOUNTER — Telehealth: Payer: Self-pay | Admitting: Gastroenterology

## 2013-02-17 NOTE — Telephone Encounter (Signed)
Left message for patient to call back  

## 2013-02-18 MED ORDER — RANITIDINE HCL 300 MG PO CAPS: 300.0000 mg | ORAL_CAPSULE | Freq: Two times a day (BID) | ORAL | Status: DC

## 2013-02-18 NOTE — Telephone Encounter (Signed)
zegerid has not been effective.  According to last office note patient to try ranitidine 300 mg BID.  He is advised that I have sent this to his pharmacy.  He will call back for additional questions or concerns

## 2013-03-16 IMAGING — CT CT ABD-PELV W/ CM
2 of 5 series · 15 of 46 positions shown, 17 images · IV contrast (Omnipaque 300)
Comparison: None.

CLINICAL DATA: 20 pounds weight loss and abdominal pain.  Decreased
appetite.  History of partial gastrectomy. History of bladder
cancer.

CT ABDOMEN AND PELVIS WITH CONTRAST
TECHNIQUE: Multidetector CT imaging of the abdomen and pelvis was
performed following the standard protocol during bolus
administration of intravenous contrast.
Contrast: 100mL OMNIPAQUE IOHEXOL 300 MG/ML  SOLN

[Series 2: abd/ pel 5mm · axial · 0.60mm/px · z∈[-474,-94]mm · 12 of 85 slices shown, 14 images]
[im 5/85  soft-tissue]
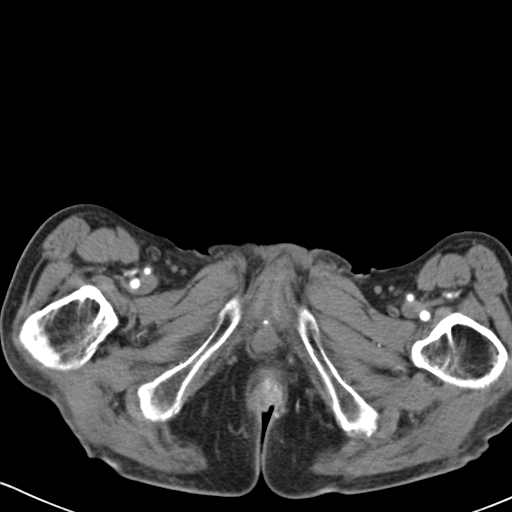
[im 5/85  bone]
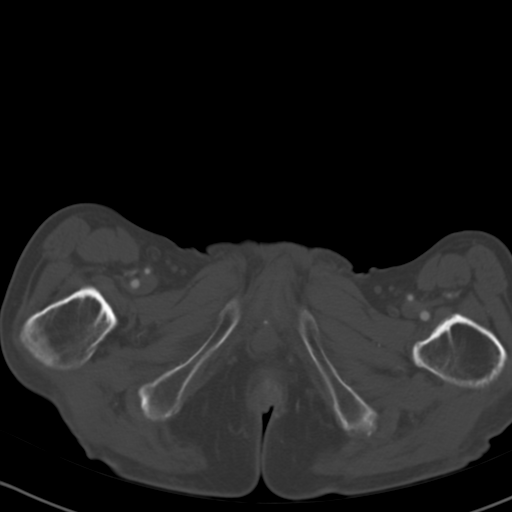
[im 13/85  soft-tissue]
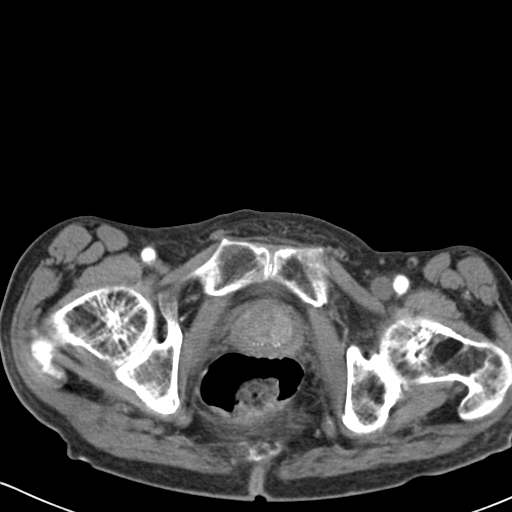
[im 21/85  soft-tissue]
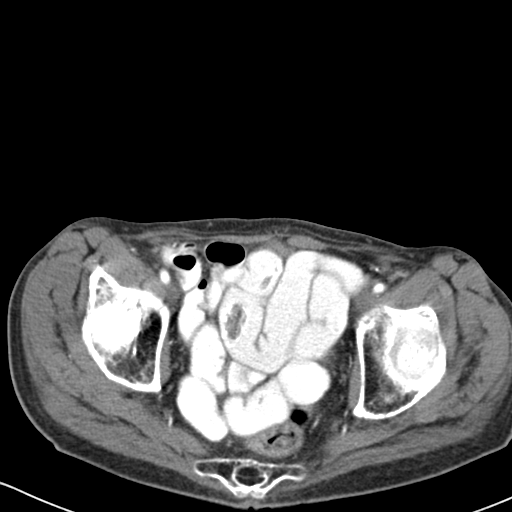
[im 25/85  soft-tissue]
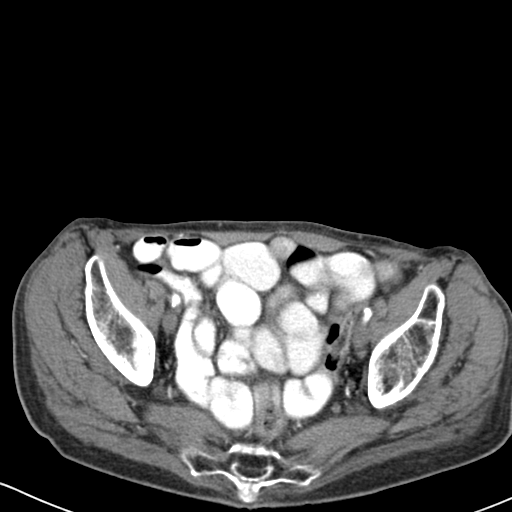
[im 33/85  soft-tissue]
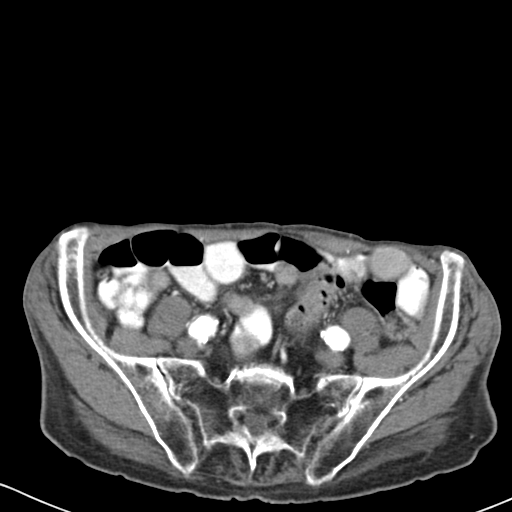
[im 41/85  soft-tissue]
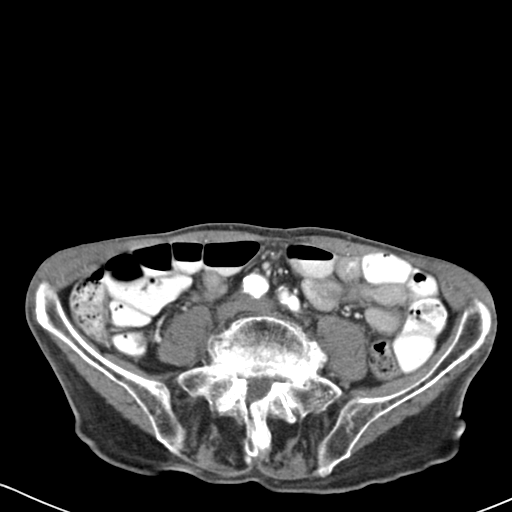
[im 45/85  soft-tissue]
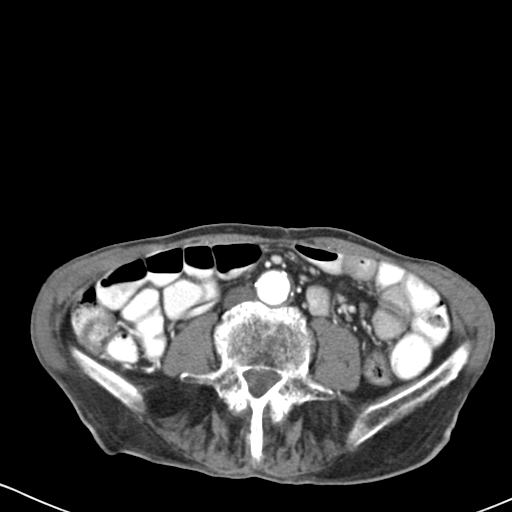
[im 53/85  soft-tissue]
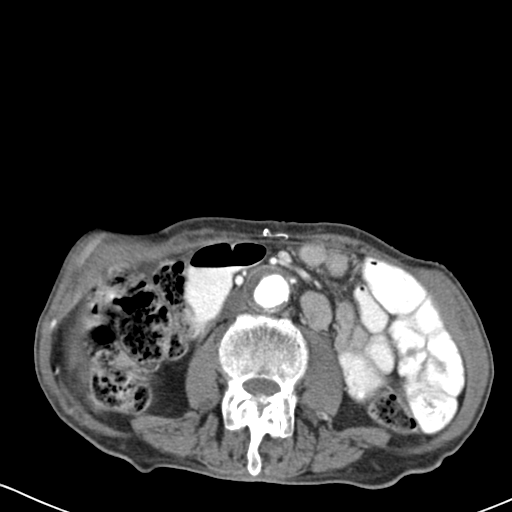
[im 61/85  soft-tissue]
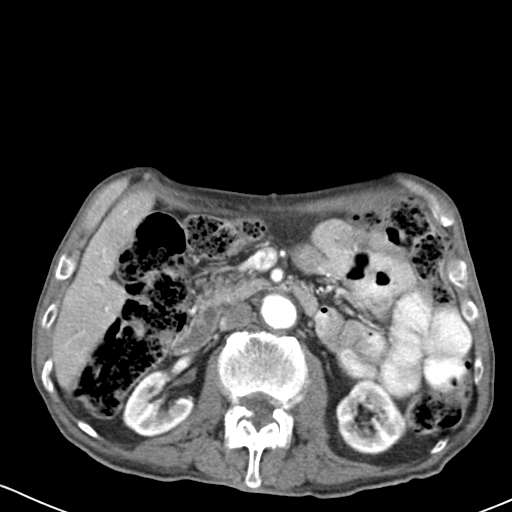
[im 61/85  bone]
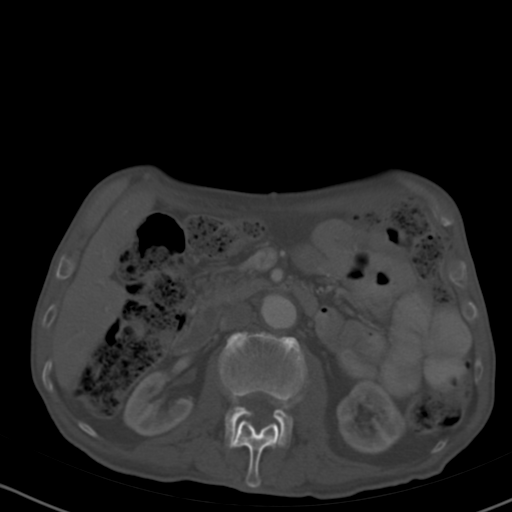
[im 65/85  soft-tissue]
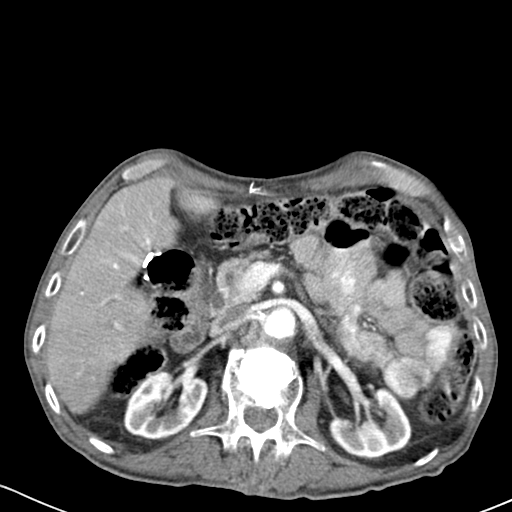
[im 73/85  soft-tissue]
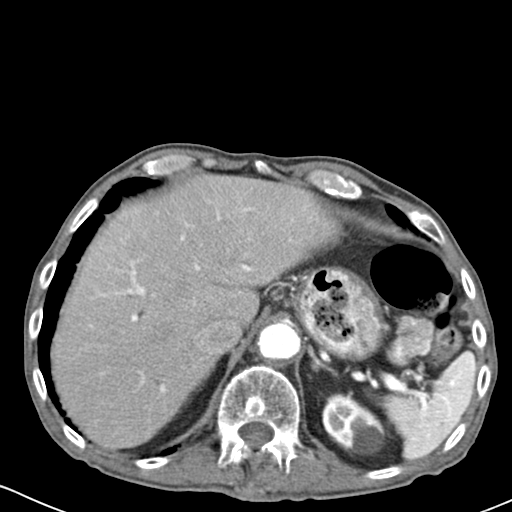
[im 81/85  soft-tissue]
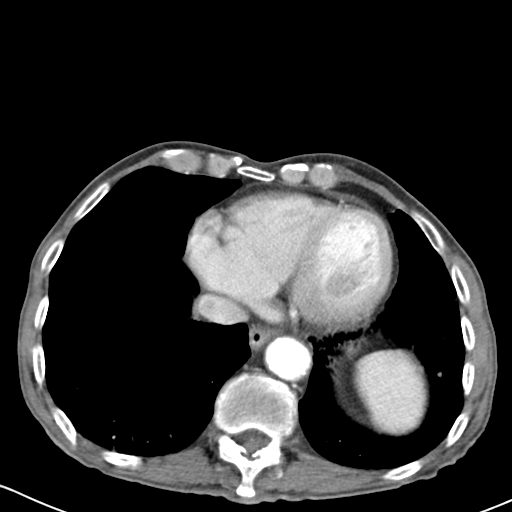

[Series 602: cor · coronal · 0.85mm/px · 3 of 76 slices shown]
[im 26/76  soft-tissue]
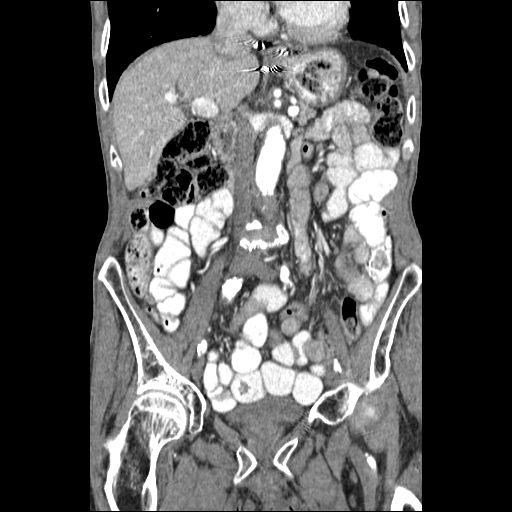
[im 34/76  soft-tissue]
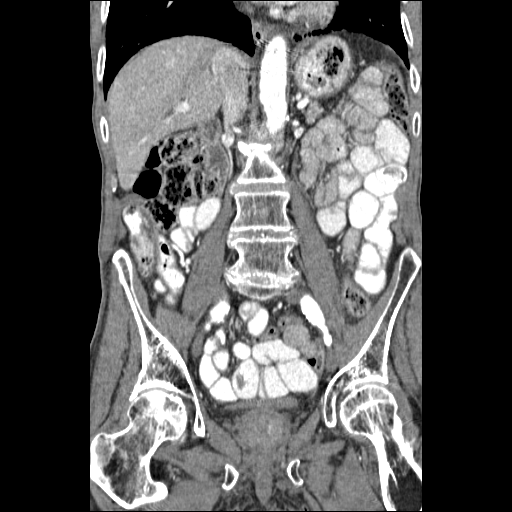
[im 42/76  soft-tissue]
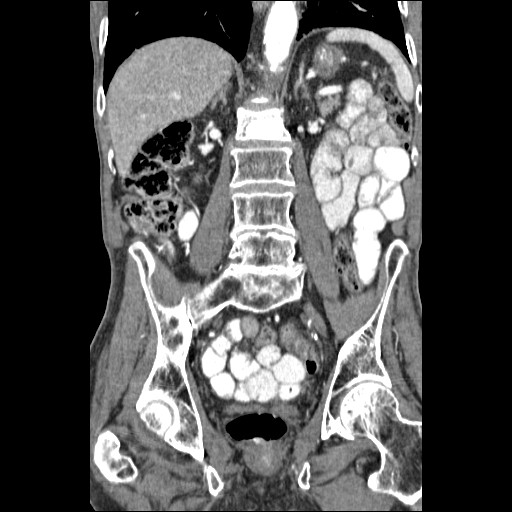

[15 of 46 positions shown; findings below may reference images not displayed]

FINDINGS: No acute findings at the lung bases.  Mild cardiomegaly.

There is mild diffuse fatty infiltration of the liver.  There are
no focal liver lesions.  There is mild intrahepatic and
extrahepatic biliary ductal dilatation in this patient status post
cholecystectomy.  Dilatation is likely related to post
cholecystectomy state.  There are no priors for comparison.

1.8 cm simple cyst upper pole left kidney.  There is slight diffuse
cortical thinning of both kidneys, with focal marked scarring of
the cortex of the lower pole of the right kidney.  No
hydronephrosis or suspicious renal lesion.  The ureters are normal
in caliber.

The urinary bladder is not very distended at the time imaging.
Urinary bladder wall thickness measures 5 mm anteriorly.  Mild
prostamegaly noted.

Negative for abdominal or pelvic retroperitoneal lymphadenopathy.
Negative for mesenteric lymphadenopathy.

Slight pancreatic atrophy.  No suspicious findings in the pancreas.
The spleen contains a focal calcifications consist with prior
granulomas disease.  There is a tiny, 4 mm of nonspecific low
density lesion in the spleen. The adrenal glands are normal.

There postsurgical changes of distal gastrectomy.  The remaining
portion of the stomach is unremarkable.  There is a prominent
amount of stool in the colon, raising the possibility of
constipation.  No evidence of colonic obstruction, and no mass
lesion detected by CT.  Small bowel loops are within normal limits
for caliber and wall thickness.  Terminal ileum has a normal
appearance.  The appendix is normal.  There is diffuse
atherosclerotic calcification, and scattered mural plaque involving
the abdominal aorta.  Abdominal aorta is ectatic, but non-
aneurysmal. There is atherosclerosis of the iliac vasculature
bilaterally.  There is atherosclerotic calcification at the origin
of both renal arteries and the origin of the celiac axis.  Branch
vessels of the aorta are patent.

There are vertebral plasty changes at L2.  The bones are
osteopenic.  No suspicious osseous lesion.
IMPRESSION: 1. No definite explanation for patient's weight loss.  No mass or
lymphadenopathy is identified.
2.  Urinary bladder wall thickness measures upper normal, 5 mm, but
this is likely accentuated by underdistension of the urinary
bladder.
3.  Extensive atherosclerotic disease and ectasia of the abdominal
aorta. Negative for aneurysm.
3.  Vertebroplasty changes at L2.
4.  Left renal cyst, diffuse mild cortical thinning of both
kidneys, and focal scarring of the lower pole of the right kidney.
5.  Mild fatty infiltration the liver.
6.  Prominent amount of stool in the colon raises possibility of
constipation.
7.  Status post cholecystectomy.  Mild intra and extrahepatic
biliary ductal dilatation is likely due to the post cholecystectomy
state.

## 2013-04-21 ENCOUNTER — Encounter (INDEPENDENT_AMBULATORY_CARE_PROVIDER_SITE_OTHER): Payer: Medicare Other

## 2013-04-21 DIAGNOSIS — I6529 Occlusion and stenosis of unspecified carotid artery: Secondary | ICD-10-CM | POA: Diagnosis not present

## 2013-04-21 DIAGNOSIS — R42 Dizziness and giddiness: Secondary | ICD-10-CM

## 2013-05-09 ENCOUNTER — Telehealth: Payer: Self-pay | Admitting: Gastroenterology

## 2013-05-09 NOTE — Telephone Encounter (Signed)
Patient c/o continued reflux despite medication changes.  He has not had improvement on ranitidine He will come in and see Dr. Russella Dar on 05/23/13

## 2013-05-23 ENCOUNTER — Ambulatory Visit: Payer: Medicare Other | Admitting: Gastroenterology

## 2013-06-06 ENCOUNTER — Ambulatory Visit (INDEPENDENT_AMBULATORY_CARE_PROVIDER_SITE_OTHER): Payer: Medicare Other | Admitting: Gastroenterology

## 2013-06-06 ENCOUNTER — Telehealth: Payer: Self-pay | Admitting: Internal Medicine

## 2013-06-06 ENCOUNTER — Encounter: Payer: Self-pay | Admitting: Gastroenterology

## 2013-06-06 VITALS — BP 140/70 | HR 60 | Ht 72.0 in | Wt 115.0 lb

## 2013-06-06 DIAGNOSIS — K219 Gastro-esophageal reflux disease without esophagitis: Secondary | ICD-10-CM

## 2013-06-06 DIAGNOSIS — E46 Unspecified protein-calorie malnutrition: Secondary | ICD-10-CM | POA: Diagnosis not present

## 2013-06-06 MED ORDER — DEXLANSOPRAZOLE 60 MG PO CPDR: 60.0000 mg | DELAYED_RELEASE_CAPSULE | Freq: Every day | ORAL | Status: DC

## 2013-06-06 NOTE — Telephone Encounter (Signed)
Patient is requesting refill on oxycodone hcl 5mg .

## 2013-06-06 NOTE — Progress Notes (Signed)
    History of Present Illness: This is an 79 year old male returning for followup of GERD. He states he omeprazole, Zegerid, ranitidine and Carafate has not provided any relief.  EGD in 09/2011 was unremarkable. He notes that 6-10 TUMS per day gives him temporarily symptom relief. Although he has a lower than normal range BMI, this has been stable over the past 18 months. He states his appetite is only fair.  Current Medications, Allergies, Past Medical History, Past Surgical History, Family History and Social History were reviewed in [employer] record.  Physical Exam: General: Well developed , well nourished, thin, elderly male, no acute distress Head: Normocephalic and atraumatic Eyes:  sclerae anicteric, EOMI Ears: Normal auditory acuity Mouth: No deformity or lesions Lungs: Clear throughout to auscultation Heart: Regular rate and rhythm; no murmurs, rubs or bruits Abdomen: Soft, non tender and non distended. No masses, hepatosplenomegaly or hernias noted. Normal Bowel sounds Musculoskeletal: Symmetrical with no gross deformities  Pulses:  Normal pulses noted Extremities: No clubbing, cyanosis, edema or deformities noted Neurological: Alert oriented x 4, grossly nonfocal Psychological:  Alert and cooperative. Normal mood and affect  Assessment and Recommendations:  1. GERD. No response to omeprazole, Zegerid, ranitidine, carafate. Trial of Dexilant. If not adequate trial of other PPIs. Continue TUMS prn.  2. Low BMI. Malnutrition. Ensure TID.

## 2013-06-06 NOTE — Patient Instructions (Signed)
Start taking Dexilant samples one tablet by mouth once daily until samples are finished. Call our office after you are finished with the samples and let us know if Dexilant has helped with eliminating your symptoms. Also, if u want a prescription for this medication.   You can can use Tums still for breakthrough symptoms.   Thank you for choosing me and [facility].  Venita Lick. Pleas Koch., MD., Clementeen Graham

## 2013-06-07 MED ORDER — OXYCODONE HCL 5 MG PO TABS: 5.0000 mg | ORAL_TABLET | Freq: Four times a day (QID) | ORAL | Status: DC | PRN

## 2013-06-07 NOTE — Telephone Encounter (Signed)
Done hardcopy to Sean [name]  Due to pt age and only little use, I will continue medication as prescribed

## 2013-06-07 NOTE — Telephone Encounter (Signed)
Called the patient no answer and no voice mail to leave a message. 

## 2013-06-07 NOTE — Telephone Encounter (Signed)
Called the patient informed to pickup hardcopy at the front desk at his convenience.

## 2013-06-08 DIAGNOSIS — H43819 Vitreous degeneration, unspecified eye: Secondary | ICD-10-CM | POA: Diagnosis not present

## 2013-06-08 DIAGNOSIS — H35349 Macular cyst, hole, or pseudohole, unspecified eye: Secondary | ICD-10-CM | POA: Diagnosis not present

## 2013-06-08 DIAGNOSIS — H35379 Puckering of macula, unspecified eye: Secondary | ICD-10-CM | POA: Diagnosis not present

## 2013-06-08 DIAGNOSIS — H3530 Unspecified macular degeneration: Secondary | ICD-10-CM | POA: Diagnosis not present

## 2013-06-28 ENCOUNTER — Telehealth: Payer: Self-pay | Admitting: Gastroenterology

## 2013-06-28 MED ORDER — ESOMEPRAZOLE MAGNESIUM 40 MG PO CPDR: DELAYED_RELEASE_CAPSULE | ORAL | Status: DC

## 2013-06-28 NOTE — Telephone Encounter (Signed)
Patient states Dexilant is not helping with reflux.  Per office note 06/06/13 if he did not respond trial of other PPI.  He will come pick up samples of Nexium to try.  He is aware to call back if it helps for a rx

## 2013-07-27 ENCOUNTER — Telehealth: Payer: Self-pay | Admitting: Gastroenterology

## 2013-07-27 NOTE — Telephone Encounter (Signed)
Patient states that all the different PPIs did not help.  He reports daily heartburn with each meal.  What is the next step? Ranitidine?

## 2013-07-27 NOTE — Telephone Encounter (Signed)
He can try ranitidine 300 mg bid but I think he felt ranitidine was nit helpful in the past. He can use TUMS frequently as this is the only med he has noted as helpful.

## 2013-07-29 MED ORDER — RANITIDINE HCL 300 MG PO CAPS: 300.0000 mg | ORAL_CAPSULE | Freq: Two times a day (BID) | ORAL | Status: DC

## 2013-07-29 NOTE — Telephone Encounter (Signed)
Patient aware.

## 2013-07-29 NOTE — Telephone Encounter (Signed)
Left message for patient to call back  

## 2013-08-05 ENCOUNTER — Encounter: Payer: Self-pay | Admitting: Internal Medicine

## 2013-08-05 ENCOUNTER — Ambulatory Visit (INDEPENDENT_AMBULATORY_CARE_PROVIDER_SITE_OTHER): Payer: Medicare Other | Admitting: Internal Medicine

## 2013-08-05 VITALS — BP 170/80 | HR 64 | Temp 98.7°F | Wt 120.0 lb

## 2013-08-05 DIAGNOSIS — R7309 Other abnormal glucose: Secondary | ICD-10-CM | POA: Diagnosis not present

## 2013-08-05 DIAGNOSIS — I10 Essential (primary) hypertension: Secondary | ICD-10-CM | POA: Diagnosis not present

## 2013-08-05 DIAGNOSIS — M545 Low back pain, unspecified: Secondary | ICD-10-CM | POA: Diagnosis not present

## 2013-08-05 DIAGNOSIS — R7302 Impaired glucose tolerance (oral): Secondary | ICD-10-CM

## 2013-08-05 DIAGNOSIS — J449 Chronic obstructive pulmonary disease, unspecified: Secondary | ICD-10-CM | POA: Diagnosis not present

## 2013-08-05 DIAGNOSIS — Z23 Encounter for immunization: Secondary | ICD-10-CM | POA: Diagnosis not present

## 2013-08-05 MED ORDER — OMEPRAZOLE 20 MG PO CPDR: 20.0000 mg | DELAYED_RELEASE_CAPSULE | Freq: Two times a day (BID) | ORAL | Status: DC

## 2013-08-05 MED ORDER — OXYCODONE HCL 5 MG PO TABS: 5.0000 mg | ORAL_TABLET | Freq: Four times a day (QID) | ORAL | Status: DC | PRN

## 2013-08-05 NOTE — Assessment & Plan Note (Signed)
stable overall by history and exam, recent data reviewed with pt, and pt to continue medical treatment as before,  to f/u any worsening symptoms or concerns SpO2 Readings from Last 3 Encounters:  02/01/13 98%  09/14/12 98%  08/03/12 99%

## 2013-08-05 NOTE — Assessment & Plan Note (Signed)
stable overall by history and exam, recent data reviewed with pt, and pt to continue medical treatment as before,  to f/u any worsening symptoms or concerns Lab Results  Component Value Date   HGBA1C 5.9 02/01/2013   Declines lab today

## 2013-08-05 NOTE — Assessment & Plan Note (Signed)
stable overall by history and exam, recent data reviewed with pt, and pt to continue medical treatment as before,  to f/u any worsening symptoms or concerns BP Readings from Last 3 Encounters:  08/05/13 170/80  06/06/13 140/70  02/01/13 120/70

## 2013-08-05 NOTE — Addendum Note (Signed)
Addended by: Westley Hummer B on: 08/05/2013 02:52 PM   Modules accepted: Orders

## 2013-08-05 NOTE — Progress Notes (Signed)
Pre visit review using our clinic review tool, if applicable. No additional management support is needed unless otherwise documented below in the visit note. 

## 2013-08-05 NOTE — Progress Notes (Signed)
Subjective:    Patient ID: Sean [name], male    DOB: 07/04/1925, 80 y.o.   MRN: [id number]  HPI  Here to f/u; overall doing ok,  Pt denies chest pain, increased sob or doe, wheezing, orthopnea, PND, increased LE swelling, palpitations, dizziness or syncope.  Pt denies polydipsia, polyuria, or low sugar symptoms such as weakness or confusion improved with po intake.  Pt denies new neurological symptoms such as new headache, or facial or extremity weakness or numbness.   Pt states overall good compliance with meds, has been trying to follow lower cholesterol diet, with wt overall stable,  Walks with cane.  Reflux better recently last 3-4 days with prilosec 20 mg  Bid as seems to work better than the nexium - requests change back to this, needs mail in rx.  BP at home most often 130's with rest at home, only seems to incr with walking, declines med tx. Pt continues to have recurring LBP without change in severity, bowel or bladder change, fever, wt loss,  worsening LE pain/numbness/weakness, gait change or falls, needs med refills. Declines further labs as well, Past Medical History  Diagnosis Date  . BLADDER CANCER 06/17/2007  . VITAMIN D DEFICIENCY 09/18/2008  . GLUCOSE INTOLERANCE 06/17/2007  . HYPERLIPIDEMIA 06/17/2007  . ABUSE, ALCOHOL, IN REMISSION 06/17/2007  . UNSPECIFIED SUDDEN HEARING LOSS 03/20/2009  . HYPERTENSION 06/17/2007  . CAROTID ARTERY DISEASE 07/23/2009  . COPD 09/18/2008  . GERD 06/17/2007  . PEPTIC ULCER DISEASE 06/17/2007  . DIVERTICULOSIS, COLON 06/17/2007  . Alcoholic cirrhosis of liver 06/17/2007  . RENAL INSUFFICIENCY 09/18/2008  . OSTEOARTHRITIS, KNEES, BILATERAL 06/19/2009  . LOW BACK PAIN, CHRONIC 12/17/2009  . OSTEOPOROSIS 06/17/2007  . PSA, INCREASED 09/18/2008  . ECHOCARDIOGRAM, ABNORMAL 07/06/2008  . COMPRESSION FRACTURE, LUMBAR VERTEBRAE 08/09/2009  . Adenomatous colon polyp 01/1984  . Impaired glucose tolerance 12/13/2010  . Tubulovillous adenoma 04/2000   Duodenal polyp  . Internal hemorrhoids   . Hiatal hernia    Past Surgical History  Procedure Laterality Date  . S/p bowel obstruction surgury  12/01  . S/p bilroth ii    . Cholecystectomy  04/2000  . Lumbar laminectomy      x 2  . Inguinal herniorrhapy    . S/p left middle ear surgury  late 80's    Dr. Juanell Fairly    reports that he quit smoking about 8 years ago. He has never used smokeless tobacco. He reports that he does not drink alcohol or use illicit drugs. family history includes Diabetes in his other. Allergies  Allergen Reactions  . Amoxicillin-Pot Clavulanate     REACTION: rash  . Ibuprofen     REACTION: itching   Current Outpatient Prescriptions on File Prior to Visit  Medication Sig Dispense Refill  . aspirin 81 MG tablet Take 81 mg by mouth daily.        . calcium carbonate (TUMS - DOSED IN MG ELEMENTAL CALCIUM) 500 MG chewable tablet Chew 2 tablets by mouth daily.      Marland Kitchen gabapentin (NEURONTIN) 100 MG capsule Take 1 capsule (100 mg total) by mouth 3 (three) times daily.  90 capsule  5  . Multiple Vitamin (MULTIVITAMINS PO) Take by mouth.        . pravastatin (PRAVACHOL) 40 MG tablet Take 40 mg by mouth daily.        . ranitidine (ZANTAC) 300 MG capsule Take 1 capsule (300 mg total) by mouth 2 (two) times daily.  60 capsule  11   No current facility-administered medications on file prior to visit.    Review of Systems  Constitutional: Negative for unexpected weight change, or unusual diaphoresis  HENT: Negative for tinnitus.   Eyes: Negative for photophobia and visual disturbance.  Respiratory: Negative for choking and stridor.   Gastrointestinal: Negative for vomiting and blood in stool.  Genitourinary: Negative for hematuria and decreased urine volume.  Musculoskeletal: Negative for acute joint swelling Skin: Negative for color change and wound.  Neurological: Negative for tremors and numbness other than noted  Psychiatric/Behavioral: Negative for decreased  concentration or  hyperactivity.       Objective:   Physical Exam BP 170/80  Pulse 64  Temp(Src) 98.7 F (37.1 C) (Oral)  Wt 120 lb (54.432 kg) VS noted,  Constitutional: Pt appears well-developed and well-nourished.  HENT: Head: NCAT.  Right Ear: External ear normal.  Left Ear: External ear normal.  Eyes: Conjunctivae and EOM are normal. Pupils are equal, round, and reactive to light.  Neck: Normal range of motion. Neck supple.  Cardiovascular: Normal rate and regular rhythm.   Pulmonary/Chest: Effort normal and breath sounds normal.  Abd:  Soft, NT, non-distended, + BS Neurological: Pt is alert. Not confused , motor 5/5 Skin: Skin is warm. No erythema.  Psychiatric: Pt behavior is normal. Thought content normal. - overall excellent for age       Assessment & Plan:

## 2013-08-05 NOTE — Assessment & Plan Note (Signed)
stable overall by history and exam, recent data reviewed with pt, and pt to continue medical treatment as before,  to f/u any worsening symptoms or concerns, for med refills Lab Results  Component Value Date   WBC 6.8 02/01/2013   HGB 11.7* 02/01/2013   HCT 35.0* 02/01/2013   PLT 220.0 02/01/2013   GLUCOSE 81 02/01/2013   CHOL 151 02/01/2013   TRIG 83.0 02/01/2013   HDL 51.50 02/01/2013   LDLCALC 83 02/01/2013   ALT 19 02/01/2013   AST 28 02/01/2013   NA 141 02/01/2013   K 4.9 02/01/2013   CL 105 02/01/2013   CREATININE 1.3 02/01/2013   BUN 25* 02/01/2013   CO2 28 02/01/2013   TSH 0.60 02/01/2013   PSA 1.67 12/25/2011   INR 1.0 06/18/2011   HGBA1C 5.9 02/01/2013

## 2013-08-05 NOTE — Patient Instructions (Signed)
You had the new Prevnar pneumonia shot today  Please continue all other medications as before, and refills have been done if requested. Please have the pharmacy call with any other refills you may need. Please continue your efforts at being more active, low cholesterol diet, and weight control.  We can hold on further lab work today  Please keep your appointments with your specialists as you may have planned  Please return in 6 months, or sooner if needed

## 2013-08-09 ENCOUNTER — Telehealth: Payer: Self-pay | Admitting: Internal Medicine

## 2013-08-09 NOTE — Telephone Encounter (Signed)
Relevant patient education mailed to patient.  

## 2013-10-19 ENCOUNTER — Other Ambulatory Visit (HOSPITAL_COMMUNITY): Payer: Self-pay | Admitting: Cardiology

## 2013-10-19 DIAGNOSIS — I6529 Occlusion and stenosis of unspecified carotid artery: Secondary | ICD-10-CM

## 2013-10-25 ENCOUNTER — Encounter: Payer: Self-pay | Admitting: Cardiology

## 2013-10-25 ENCOUNTER — Ambulatory Visit (HOSPITAL_COMMUNITY): Payer: Medicare Other | Attending: Cardiology | Admitting: Cardiology

## 2013-10-25 DIAGNOSIS — I6529 Occlusion and stenosis of unspecified carotid artery: Secondary | ICD-10-CM | POA: Diagnosis not present

## 2013-10-25 NOTE — Progress Notes (Signed)
Carotid duplex complete 

## 2013-11-01 ENCOUNTER — Telehealth: Payer: Self-pay | Admitting: Nurse Practitioner

## 2013-11-01 DIAGNOSIS — I6529 Occlusion and stenosis of unspecified carotid artery: Secondary | ICD-10-CM

## 2013-11-01 NOTE — Telephone Encounter (Signed)
Message copied by Emmaline Life on Tue Nov 01, 2013 12:14 PM ------      Message from: Josue Hector      Created: Mon Oct 31, 2013 11:27 AM       60-79% RICA stenosis.  F/U carotid duplex in 6 months      46-28% LICA stenosis.        ------

## 2013-11-01 NOTE — Telephone Encounter (Signed)
Patient aware of carotid results and f/u needed in 6 months; patient verbalized understanding.  Order and recall in epic.

## 2013-11-15 ENCOUNTER — Telehealth: Payer: Self-pay | Admitting: Internal Medicine

## 2013-11-15 MED ORDER — OXYCODONE HCL 5 MG PO TABS
5.0000 mg | ORAL_TABLET | Freq: Four times a day (QID) | ORAL | Status: DC | PRN
Start: 2013-11-15 — End: 2014-02-02

## 2013-11-15 NOTE — Telephone Encounter (Signed)
Requesting a refill on oxycodone.

## 2013-11-15 NOTE — Telephone Encounter (Signed)
Done hardcopy to robin  

## 2013-11-15 NOTE — Telephone Encounter (Signed)
Pt called & made aware rx is ready for pickup.

## 2013-12-01 ENCOUNTER — Telehealth: Payer: Self-pay | Admitting: Gastroenterology

## 2013-12-01 NOTE — Telephone Encounter (Signed)
Patient reports he has daily heartburn with ranitidine.  He is wandering if there is anything else he can try.  He is offered an appt , but he declines for now. He will try OTC gaviscon and see if this helps.  If not he will call for an appt

## 2014-02-02 ENCOUNTER — Encounter: Payer: Self-pay | Admitting: Internal Medicine

## 2014-02-02 ENCOUNTER — Other Ambulatory Visit (INDEPENDENT_AMBULATORY_CARE_PROVIDER_SITE_OTHER): Payer: Medicare Other

## 2014-02-02 ENCOUNTER — Ambulatory Visit (INDEPENDENT_AMBULATORY_CARE_PROVIDER_SITE_OTHER): Payer: Medicare Other | Admitting: Internal Medicine

## 2014-02-02 VITALS — BP 128/70 | HR 66 | Temp 98.2°F | Wt 114.5 lb

## 2014-02-02 DIAGNOSIS — R7309 Other abnormal glucose: Secondary | ICD-10-CM | POA: Diagnosis not present

## 2014-02-02 DIAGNOSIS — I10 Essential (primary) hypertension: Secondary | ICD-10-CM | POA: Diagnosis not present

## 2014-02-02 DIAGNOSIS — I6529 Occlusion and stenosis of unspecified carotid artery: Secondary | ICD-10-CM | POA: Diagnosis not present

## 2014-02-02 DIAGNOSIS — J449 Chronic obstructive pulmonary disease, unspecified: Secondary | ICD-10-CM | POA: Diagnosis not present

## 2014-02-02 DIAGNOSIS — R7302 Impaired glucose tolerance (oral): Secondary | ICD-10-CM

## 2014-02-02 DIAGNOSIS — E785 Hyperlipidemia, unspecified: Secondary | ICD-10-CM

## 2014-02-02 LAB — BASIC METABOLIC PANEL
BUN: 28 mg/dL — AB (ref 6–23)
CHLORIDE: 103 meq/L (ref 96–112)
CO2: 30 mEq/L (ref 19–32)
Calcium: 9.3 mg/dL (ref 8.4–10.5)
Creatinine, Ser: 1.4 mg/dL (ref 0.4–1.5)
GFR: 52.27 mL/min — AB (ref >60.00)
Glucose, Bld: 82 mg/dL (ref 70–99)
POTASSIUM: 4.4 meq/L (ref 3.5–5.1)
SODIUM: 137 meq/L (ref 135–145)

## 2014-02-02 LAB — CBC WITH DIFFERENTIAL/PLATELET
Basophils Absolute: 0 10*3/uL (ref 0.0–0.1)
Basophils Relative: 0.4 % (ref 0.0–3.0)
EOS PCT: 1.4 % (ref 0.0–5.0)
Eosinophils Absolute: 0.1 10*3/uL (ref 0.0–0.7)
HCT: 34 % — ABNORMAL LOW (ref 39.0–52.0)
Hemoglobin: 11.4 g/dL — ABNORMAL LOW (ref 13.0–17.0)
Lymphocytes Relative: 30.6 % (ref 12.0–46.0)
Lymphs Abs: 2 10*3/uL (ref 0.7–4.0)
MCHC: 33.4 g/dL (ref 30.0–36.0)
MCV: 96.1 fl (ref 78.0–100.0)
MONOS PCT: 9.4 % (ref 3.0–12.0)
Monocytes Absolute: 0.6 10*3/uL (ref 0.1–1.0)
NEUTROS ABS: 3.8 10*3/uL (ref 1.4–7.7)
Neutrophils Relative %: 58.2 % (ref 43.0–77.0)
PLATELETS: 158 10*3/uL (ref 150.0–400.0)
RBC: 3.54 Mil/uL — AB (ref 4.22–5.81)
RDW: 13.8 % (ref 11.5–15.5)
WBC: 6.6 10*3/uL (ref 4.0–10.5)

## 2014-02-02 LAB — URINALYSIS, ROUTINE W REFLEX MICROSCOPIC
Bilirubin Urine: NEGATIVE
Hgb urine dipstick: NEGATIVE
Nitrite: NEGATIVE
PH: 6 (ref 5.0–8.0)
RBC / HPF: NONE SEEN (ref 0–?)
SPECIFIC GRAVITY, URINE: 1.02 (ref 1.000–1.030)
Total Protein, Urine: 30 — AB
Urine Glucose: 250 — AB
Urobilinogen, UA: 0.2 (ref 0.0–1.0)

## 2014-02-02 LAB — HEPATIC FUNCTION PANEL
ALK PHOS: 90 U/L (ref 39–117)
ALT: 20 U/L (ref 0–53)
AST: 37 U/L (ref 0–37)
Albumin: 3.9 g/dL (ref 3.5–5.2)
Bilirubin, Direct: 0.1 mg/dL (ref 0.0–0.3)
Total Bilirubin: 0.3 mg/dL (ref 0.2–1.2)
Total Protein: 7.1 g/dL (ref 6.0–8.3)

## 2014-02-02 LAB — LIPID PANEL
CHOL/HDL RATIO: 3
Cholesterol: 180 mg/dL (ref 0–200)
HDL: 55.2 mg/dL (ref >39.00)
LDL Cholesterol: 104 mg/dL — ABNORMAL HIGH (ref 0–99)
NonHDL: 124.8
Triglycerides: 104 mg/dL (ref 0.0–149.0)
VLDL: 20.8 mg/dL (ref 0.0–40.0)

## 2014-02-02 LAB — TSH: TSH: 1.38 u[IU]/mL (ref 0.35–4.50)

## 2014-02-02 LAB — HEMOGLOBIN A1C: Hgb A1c MFr Bld: 6 % (ref 4.6–6.5)

## 2014-02-02 MED ORDER — OXYCODONE HCL 5 MG PO TABS: 5.0000 mg | ORAL_TABLET | Freq: Four times a day (QID) | ORAL | Status: DC | PRN

## 2014-02-02 NOTE — Patient Instructions (Addendum)
Please continue all other medications as before, and refills have been done if requested - the oxycodone  Please have the pharmacy call with any other refills you may need.  Please continue your efforts at being more active, low cholesterol diet, and weight control.  You are otherwise up to date with prevention measures today.  Please keep your appointments with your specialists as you may have planned  Please go to the LAB in the Basement (turn left off the elevator) for the tests to be done today  You will be contacted by phone if any changes need to be made immediately.  Otherwise, you will receive a letter about your results with an explanation, but please check with MyChart first.  Please remember to sign up for MyChart if you have not done so, as this will be important to you in the future with finding out test results, communicating by private email, and scheduling acute appointments online when needed.  Please return in 6 months, or sooner if needed

## 2014-02-02 NOTE — Progress Notes (Signed)
Subjective:    Patient ID: Sean [name], male    DOB: 04-05-1925, 80 y.o.   MRN: [id number]  HPI Here to f/u; c/o neck cracking and popping, heartburn (no help with seeing GI so far, now on ranitidine and prilosec, and tums helps for a short while prn), and Pt continues to have recurring LBP without change in severity, bowel or bladder change, fever, wt loss,  worsening LE pain/numbness/weakness, gait change or falls.     Pt denies fever, wt loss, night sweats, loss of appetite, or other constitutional symptoms  No other new complaints Past Medical History  Diagnosis Date  . BLADDER CANCER 06/17/2007  . VITAMIN D DEFICIENCY 09/18/2008  . GLUCOSE INTOLERANCE 06/17/2007  . HYPERLIPIDEMIA 06/17/2007  . ABUSE, ALCOHOL, IN REMISSION 06/17/2007  . UNSPECIFIED SUDDEN HEARING LOSS 03/20/2009  . HYPERTENSION 06/17/2007  . CAROTID ARTERY DISEASE 07/23/2009  . COPD 09/18/2008  . GERD 06/17/2007  . PEPTIC ULCER DISEASE 06/17/2007  . DIVERTICULOSIS, COLON 06/17/2007  . Alcoholic cirrhosis of liver 06/17/2007  . RENAL INSUFFICIENCY 09/18/2008  . OSTEOARTHRITIS, KNEES, BILATERAL 06/19/2009  . LOW BACK PAIN, CHRONIC 12/17/2009  . OSTEOPOROSIS 06/17/2007  . PSA, INCREASED 09/18/2008  . ECHOCARDIOGRAM, ABNORMAL 07/06/2008  . COMPRESSION FRACTURE, LUMBAR VERTEBRAE 08/09/2009  . Adenomatous colon polyp 01/1984  . Impaired glucose tolerance 12/13/2010  . Tubulovillous adenoma 04/2000    Duodenal polyp  . Internal hemorrhoids   . Hiatal hernia    Past Surgical History  Procedure Laterality Date  . S/p bowel obstruction surgury  12/01  . S/p bilroth ii    . Cholecystectomy  04/2000  . Lumbar laminectomy      x 2  . Inguinal herniorrhapy    . S/p left middle ear surgury  late 80's    Dr. Juanell Fairly    reports that he quit smoking about 9 years ago. He has never used smokeless tobacco. He reports that he does not drink alcohol or use illicit drugs. family history includes Diabetes in his  other. Allergies  Allergen Reactions  . Amoxicillin-Pot Clavulanate     REACTION: rash  . Ibuprofen     REACTION: itching   Current Outpatient Prescriptions on File Prior to Visit  Medication Sig Dispense Refill  . aspirin 81 MG tablet Take 81 mg by mouth daily.        . calcium carbonate (TUMS - DOSED IN MG ELEMENTAL CALCIUM) 500 MG chewable tablet Chew 2 tablets by mouth daily.      Marland Kitchen gabapentin (NEURONTIN) 100 MG capsule Take 1 capsule (100 mg total) by mouth 3 (three) times daily.  90 capsule  5  . Multiple Vitamin (MULTIVITAMINS PO) Take by mouth.        Marland Kitchen omeprazole (PRILOSEC) 20 MG capsule Take 1 capsule (20 mg total) by mouth 2 (two) times daily before a meal.  180 capsule  3  . pravastatin (PRAVACHOL) 40 MG tablet Take 40 mg by mouth daily.        . ranitidine (ZANTAC) 300 MG capsule Take 1 capsule (300 mg total) by mouth 2 (two) times daily.  60 capsule  11   No current facility-administered medications on file prior to visit.   Review of Systems  Constitutional: Negative for unusual diaphoresis or other sweats  HENT: Negative for ringing in ear Eyes: Negative for double vision or worsening visual disturbance.  Respiratory: Negative for choking and stridor.   Gastrointestinal: Negative for vomiting or other signifcant bowel change Genitourinary: Negative for  hematuria or decreased urine volume.  Musculoskeletal: Negative for other MSK pain or swelling Skin: Negative for color change and worsening wound.  Neurological: Negative for tremors and numbness other than noted  Psychiatric/Behavioral: Negative for decreased concentration or agitation other than above       Objective:   Physical Exam BP 128/70  Pulse 66  Temp(Src) 98.2 F (36.8 C) (Oral)  Wt 114 lb 8 oz (51.937 kg)  SpO2 96% VS noted,  Constitutional: Pt appears well-developed, well-nourished.  HENT: Head: NCAT.  Right Ear: External ear normal.  Left Ear: External ear normal.  Eyes: . Pupils are equal,  round, and reactive to light. Conjunctivae and EOM are normal Neck: Normal range of motion. Neck supple.  Cardiovascular: Normal rate and regular rhythm.   Pulmonary/Chest: Effort normal and breath sounds normal.  Abd:  Soft, NT, ND, + BS Neurological: Pt is alert. Not confused , motor grossly intact Skin: Skin is warm. No rash Psychiatric: Pt behavior is normal. No agitation.     Assessment & Plan:

## 2014-02-02 NOTE — Progress Notes (Signed)
Pre visit review using our clinic review tool, if applicable. No additional management support is needed unless otherwise documented below in the visit note. 

## 2014-02-05 NOTE — Assessment & Plan Note (Signed)
stable overall by history and exam, recent data reviewed with pt, and pt to continue medical treatment as before,  to f/u any worsening symptoms or concerns Lab Results  Component Value Date   LDLCALC 104* 02/02/2014

## 2014-02-05 NOTE — Assessment & Plan Note (Signed)
stable overall by history and exam, recent data reviewed with pt, and pt to continue medical treatment as before,  to f/u any worsening symptoms or concerns Lab Results  Component Value Date   HGBA1C 6.0 02/02/2014

## 2014-02-05 NOTE — Assessment & Plan Note (Signed)
stable overall by history and exam, recent data reviewed with pt, and pt to continue medical treatment as before,  to f/u any worsening symptoms or concerns BP Readings from Last 3 Encounters:  02/02/14 128/70  08/05/13 170/80  06/06/13 140/70

## 2014-02-05 NOTE — Assessment & Plan Note (Signed)
stable overall by history and exam, recent data reviewed with pt, and pt to continue medical treatment as before,  to f/u any worsening symptoms or concerns SpO2 Readings from Last 3 Encounters:  02/02/14 96%  02/01/13 98%  09/14/12 98%

## 2014-05-08 DIAGNOSIS — Z23 Encounter for immunization: Secondary | ICD-10-CM | POA: Diagnosis not present

## 2014-05-15 ENCOUNTER — Telehealth: Payer: Self-pay | Admitting: Internal Medicine

## 2014-05-15 NOTE — Telephone Encounter (Signed)
Pt came by office to request Rx refill for OXYCODONE HCL tablets. Please contact pt when request is reviewed.

## 2014-05-16 DIAGNOSIS — Z23 Encounter for immunization: Secondary | ICD-10-CM | POA: Diagnosis not present

## 2014-05-16 MED ORDER — OXYCODONE HCL 5 MG PO TABS: 5.0000 mg | ORAL_TABLET | Freq: Four times a day (QID) | ORAL | Status: DC | PRN

## 2014-05-16 NOTE — Telephone Encounter (Signed)
Notified pt rx ready for pick-up. Place in cabinet.../lmb 

## 2014-05-16 NOTE — Telephone Encounter (Signed)
Done hardcopy to robin  

## 2014-05-23 DIAGNOSIS — H34811 Central retinal vein occlusion, right eye: Secondary | ICD-10-CM | POA: Diagnosis not present

## 2014-05-26 ENCOUNTER — Ambulatory Visit (HOSPITAL_COMMUNITY): Payer: Medicare Other | Attending: Cardiology | Admitting: *Deleted

## 2014-05-26 DIAGNOSIS — I10 Essential (primary) hypertension: Secondary | ICD-10-CM | POA: Diagnosis not present

## 2014-05-26 DIAGNOSIS — J449 Chronic obstructive pulmonary disease, unspecified: Secondary | ICD-10-CM | POA: Insufficient documentation

## 2014-05-26 DIAGNOSIS — R0989 Other specified symptoms and signs involving the circulatory and respiratory systems: Secondary | ICD-10-CM | POA: Diagnosis not present

## 2014-05-26 DIAGNOSIS — Z87891 Personal history of nicotine dependence: Secondary | ICD-10-CM | POA: Insufficient documentation

## 2014-05-26 DIAGNOSIS — I6523 Occlusion and stenosis of bilateral carotid arteries: Secondary | ICD-10-CM | POA: Diagnosis not present

## 2014-05-26 DIAGNOSIS — E785 Hyperlipidemia, unspecified: Secondary | ICD-10-CM | POA: Diagnosis not present

## 2014-05-26 NOTE — Progress Notes (Signed)
Carotid Duplex Performed 

## 2014-06-02 DIAGNOSIS — H34812 Central retinal vein occlusion, left eye: Secondary | ICD-10-CM | POA: Diagnosis not present

## 2014-07-07 DIAGNOSIS — H34812 Central retinal vein occlusion, left eye: Secondary | ICD-10-CM | POA: Diagnosis not present

## 2014-07-07 DIAGNOSIS — H3581 Retinal edema: Secondary | ICD-10-CM | POA: Diagnosis not present

## 2014-07-26 ENCOUNTER — Other Ambulatory Visit: Payer: Self-pay | Admitting: Internal Medicine

## 2014-07-26 ENCOUNTER — Ambulatory Visit (INDEPENDENT_AMBULATORY_CARE_PROVIDER_SITE_OTHER): Payer: Medicare Other | Admitting: Internal Medicine

## 2014-07-26 ENCOUNTER — Encounter: Payer: Self-pay | Admitting: Internal Medicine

## 2014-07-26 ENCOUNTER — Ambulatory Visit (INDEPENDENT_AMBULATORY_CARE_PROVIDER_SITE_OTHER)
Admission: RE | Admit: 2014-07-26 | Discharge: 2014-07-26 | Disposition: A | Discharge status: Home / Self Care | Payer: Medicare Other | Source: Ambulatory Visit | Attending: Internal Medicine | Admitting: Internal Medicine

## 2014-07-26 VITALS — BP 144/70 | HR 73 | Temp 98.0°F | Ht 72.0 in | Wt 121.2 lb

## 2014-07-26 DIAGNOSIS — S79911A Unspecified injury of right hip, initial encounter: Secondary | ICD-10-CM | POA: Diagnosis not present

## 2014-07-26 DIAGNOSIS — M25551 Pain in right hip: Secondary | ICD-10-CM | POA: Diagnosis not present

## 2014-07-26 DIAGNOSIS — R296 Repeated falls: Secondary | ICD-10-CM | POA: Diagnosis not present

## 2014-07-26 DIAGNOSIS — I6529 Occlusion and stenosis of unspecified carotid artery: Secondary | ICD-10-CM | POA: Diagnosis not present

## 2014-07-26 NOTE — Patient Instructions (Signed)
Your next office appointment will be determined based upon review of your pending x-rays. Those instructions will be transmitted to you through by mail or by phone if immediate follow up indicated. Followup as needed for your acute issue. Please report any significant change in your symptoms.  Reflux of gastric acid may be asymptomatic as this may occur mainly during sleep.The triggers for reflux  include stress; the "aspirin family" ; alcohol; peppermint; and caffeine (coffee, tea, cola, and chocolate). The aspirin family would include aspirin and the nonsteroidal agents such as ibuprofen &  Naproxen. Tylenol would not cause reflux. If having symptoms ; food & drink should be avoided for @ least 2 hours before going to bed.

## 2014-07-26 NOTE — Progress Notes (Signed)
Pre visit review using our clinic review tool, if applicable. No additional management support is needed unless otherwise documented below in the visit note. 

## 2014-07-26 NOTE — Progress Notes (Signed)
   Subjective:    Patient ID: Sean [name], male    DOB: 1925-06-25, 80 y.o.   MRN: [id number]  HPI  As he was walking into his house while using his rolling walker 07/14/14 he began to fall backwards  landed  on his buttocks and also scraped his right wrist area  He has had issues with retropulsion type symptoms in the past resulting in  falls.   He had no cardiac or neurologic prodrome prior to the fall  He's continued to have some pain in the right inguinal area with ambulation. His granddaughter, a physician,recommended an x-ray of the pelvis to rule out "hairline fracture" .   Significant past medical history includes history of alcohol abuse in remission. He has a history of peripheral neuropathy with chronic imbalance. No PMH of Parkinson's. PMH of Osteoporosis & vertebral compression fracture.  Review of Systems  Denied were any change in heart rhythm or rate prior to the event. There was no associated chest pain or shortness of breath .  Also specifically denied prior to the episode were headache, limb weakness, tingling, or numbness. No seizure activity noted.   He does have some chronic reflux symptoms which has been suboptimally responsive to ranitidine 300 mg.He is followed by [facility]      Objective:   Physical Exam  Pertinent or positive findings include: He is thin but adequately nourished. He appears younger than his stated age Breath sounds are decreased without increased work of breathing He has marked deformities of the hands particularly the right. There is striking Dupuytren's  contracture with a central deficit. There is lateral deviation of the fingers. Pedal pulses are decreased Deep tendon reflexes at the knees are decreased He has no pain to percussion over the lumbosacral area He has no pelvic pain with rotation of the right hip and lower extremity. He ambulates slowly & deliberately with the rolling walker.      Assessment & Plan:  #1  right inguinal pain after a fall 12/18  #2 GERD  Plan: See orders and after visit summary.

## 2014-07-27 ENCOUNTER — Other Ambulatory Visit: Payer: Self-pay | Admitting: Internal Medicine

## 2014-07-27 DIAGNOSIS — M549 Dorsalgia, unspecified: Secondary | ICD-10-CM

## 2014-07-27 DIAGNOSIS — G8929 Other chronic pain: Secondary | ICD-10-CM

## 2014-07-27 DIAGNOSIS — R269 Unspecified abnormalities of gait and mobility: Secondary | ICD-10-CM

## 2014-07-27 DIAGNOSIS — G629 Polyneuropathy, unspecified: Secondary | ICD-10-CM

## 2014-07-27 DIAGNOSIS — J449 Chronic obstructive pulmonary disease, unspecified: Secondary | ICD-10-CM

## 2014-08-07 DIAGNOSIS — H6123 Impacted cerumen, bilateral: Secondary | ICD-10-CM | POA: Diagnosis not present

## 2014-08-07 DIAGNOSIS — H9072 Mixed conductive and sensorineural hearing loss, unilateral, left ear, with unrestricted hearing on the contralateral side: Secondary | ICD-10-CM | POA: Diagnosis not present

## 2014-08-07 DIAGNOSIS — H903 Sensorineural hearing loss, bilateral: Secondary | ICD-10-CM | POA: Diagnosis not present

## 2014-08-09 ENCOUNTER — Encounter: Payer: Self-pay | Admitting: Internal Medicine

## 2014-08-09 ENCOUNTER — Ambulatory Visit (INDEPENDENT_AMBULATORY_CARE_PROVIDER_SITE_OTHER): Payer: Medicare Other | Admitting: Internal Medicine

## 2014-08-09 ENCOUNTER — Other Ambulatory Visit (INDEPENDENT_AMBULATORY_CARE_PROVIDER_SITE_OTHER): Payer: Medicare Other

## 2014-08-09 VITALS — BP 144/72 | HR 97 | Temp 97.3°F | Ht 72.0 in | Wt 124.2 lb

## 2014-08-09 DIAGNOSIS — R7302 Impaired glucose tolerance (oral): Secondary | ICD-10-CM | POA: Diagnosis not present

## 2014-08-09 DIAGNOSIS — R079 Chest pain, unspecified: Secondary | ICD-10-CM

## 2014-08-09 DIAGNOSIS — R5383 Other fatigue: Secondary | ICD-10-CM

## 2014-08-09 DIAGNOSIS — E785 Hyperlipidemia, unspecified: Secondary | ICD-10-CM

## 2014-08-09 DIAGNOSIS — I10 Essential (primary) hypertension: Secondary | ICD-10-CM

## 2014-08-09 DIAGNOSIS — J449 Chronic obstructive pulmonary disease, unspecified: Secondary | ICD-10-CM

## 2014-08-09 LAB — CBC WITH DIFFERENTIAL/PLATELET
Basophils Absolute: 0 10*3/uL (ref 0.0–0.1)
Basophils Relative: 0.6 % (ref 0.0–3.0)
Eosinophils Absolute: 0.2 10*3/uL (ref 0.0–0.7)
Eosinophils Relative: 2.4 % (ref 0.0–5.0)
HCT: 33.5 % — ABNORMAL LOW (ref 39.0–52.0)
HEMOGLOBIN: 10.9 g/dL — AB (ref 13.0–17.0)
LYMPHS ABS: 2.2 10*3/uL (ref 0.7–4.0)
Lymphocytes Relative: 29.2 % (ref 12.0–46.0)
MCHC: 32.6 g/dL (ref 30.0–36.0)
MCV: 91.5 fl (ref 78.0–100.0)
MONO ABS: 0.7 10*3/uL (ref 0.1–1.0)
MONOS PCT: 9.3 % (ref 3.0–12.0)
Neutro Abs: 4.3 10*3/uL (ref 1.4–7.7)
Neutrophils Relative %: 58.5 % (ref 43.0–77.0)
Platelets: 167 10*3/uL (ref 150.0–400.0)
RBC: 3.66 Mil/uL — AB (ref 4.22–5.81)
RDW: 16 % — AB (ref 11.5–15.5)
WBC: 7.4 10*3/uL (ref 4.0–10.5)

## 2014-08-09 LAB — TSH: TSH: 1.49 u[IU]/mL (ref 0.35–4.50)

## 2014-08-09 MED ORDER — OXYCODONE HCL 5 MG PO TABS: 5.0000 mg | ORAL_TABLET | Freq: Four times a day (QID) | ORAL | Status: DC | PRN

## 2014-08-09 NOTE — Assessment & Plan Note (Signed)
stable overall by history and exam, recent data reviewed with pt, and pt to continue medical treatment as before,  to f/u any worsening symptoms or concerns BP Readings from Last 3 Encounters:  08/09/14 144/72  07/26/14 144/70  02/02/14 128/70

## 2014-08-09 NOTE — Assessment & Plan Note (Addendum)
C/w persistent right msk rib pain post fall, no bruise or skin change, no sob/doe, to cont to follow, do not feel needs imaging at this time as doubt pnthx, and rib fx finding would not change management  Note:  Total time for pt hx, exam, review of record with pt in the room, determination of diagnoses and plan for further eval and tx is > 40 min, with over 50% spent in coordination and counseling of patient

## 2014-08-09 NOTE — Assessment & Plan Note (Signed)
stable overall by history and exam, recent data reviewed with pt, and pt to continue medical treatment as before,  to f/u any worsening symptoms or concerns SpO2 Readings from Last 3 Encounters:  07/26/14 99%  02/02/14 96%  02/01/13 98%

## 2014-08-09 NOTE — Patient Instructions (Signed)
Please continue all other medications as before, and refills have been done if requested.  Please have the pharmacy call with any other refills you may need.  Please keep your appointments with your specialists as you may have planned  Please go to the LAB in the Basement (turn left off the elevator) for the tests to be done today  You will be contacted by phone if any changes need to be made immediately.  Otherwise, you will receive a letter about your results with an explanation, but please check with MyChart first.  Please return in 6 months, or sooner if needed

## 2014-08-09 NOTE — Assessment & Plan Note (Signed)
Etiology unclear, Exam otherwise benign, to check labs as documented, follow with expectant management  

## 2014-08-09 NOTE — Assessment & Plan Note (Signed)
stable overall by history and exam, recent data reviewed with pt, and pt to continue medical treatment as before,  to f/u any worsening symptoms or concerns Lab Results  Component Value Date   LDLCALC 104* 02/02/2014

## 2014-08-09 NOTE — Assessment & Plan Note (Signed)
stable overall by history and exam, recent data reviewed with pt, and pt to continue medical treatment as before,  to f/u any worsening symptoms or concerns Lab Results  Component Value Date   HGBA1C 6.0 02/02/2014

## 2014-08-09 NOTE — Progress Notes (Signed)
Pre visit review using our clinic review tool, if applicable. No additional management support is needed unless otherwise documented below in the visit note. 

## 2014-08-09 NOTE — Progress Notes (Signed)
Subjective:    Patient ID: Sean [name], male    DOB: 1925/01/24, 81 y.o.   MRN: [id number]  HPI  Here to f/u; overall doing ok,  Pt denies chest pain, increased sob or doe, wheezing, orthopnea, PND, increased LE swelling, palpitations, dizziness or syncope.  Pt denies polydipsia, polyuria, or low sugar symptoms such as weakness or confusion improved with po intake.  Pt denies new neurological symptoms such as new headache, or facial or extremity weakness or numbness.   Pt states overall good compliance with meds, has been trying to follow lower cholesterol, diabetic diet, with wt overall stable,  but little exercise however, has ongoing severe pain, due for oxycodone refill, walks with cane.  Declines further evaluation. Just saw Dr hopper recent after fall, walks with cane,still has pain to right lower ant chest but only with movement, not at rest.  Right hip pain resolved, right hip film neg for acute dec 30.   Has had ongoing reflux, takes freq tums with variable results, has had complete GI eval with EGD, has tried mult antacid wihtout hlep in past, no abd pain, dysphagia, n/v, bowel change or blood. Past Medical History  Diagnosis Date  . BLADDER CANCER 06/17/2007  . VITAMIN D DEFICIENCY 09/18/2008  . GLUCOSE INTOLERANCE 06/17/2007  . HYPERLIPIDEMIA 06/17/2007  . ABUSE, ALCOHOL, IN REMISSION 06/17/2007  . UNSPECIFIED SUDDEN HEARING LOSS 03/20/2009  . HYPERTENSION 06/17/2007  . CAROTID ARTERY DISEASE 07/23/2009  . COPD 09/18/2008  . GERD 06/17/2007  . PEPTIC ULCER DISEASE 06/17/2007  . DIVERTICULOSIS, COLON 06/17/2007  . Alcoholic cirrhosis of liver 06/17/2007  . RENAL INSUFFICIENCY 09/18/2008  . OSTEOARTHRITIS, KNEES, BILATERAL 06/19/2009  . LOW BACK PAIN, CHRONIC 12/17/2009  . OSTEOPOROSIS 06/17/2007  . PSA, INCREASED 09/18/2008  . ECHOCARDIOGRAM, ABNORMAL 07/06/2008  . COMPRESSION FRACTURE, LUMBAR VERTEBRAE 08/09/2009  . Adenomatous colon polyp 01/1984  . Impaired glucose tolerance  12/13/2010  . Tubulovillous adenoma 04/2000    Duodenal polyp  . Internal hemorrhoids   . Hiatal hernia    Past Surgical History  Procedure Laterality Date  . S/p bowel obstruction surgury  12/01  . S/p bilroth ii    . Cholecystectomy  04/2000  . Lumbar laminectomy      x 2  . Inguinal herniorrhapy    . S/p left middle ear surgury  late 80's    Dr. Juanell Fairly    reports that he quit smoking about 9 years ago. He has never used smokeless tobacco. He reports that he does not drink alcohol or use illicit drugs. family history includes Diabetes in his other. Allergies  Allergen Reactions  . Amoxicillin-Pot Clavulanate     REACTION: rash  . Ibuprofen     REACTION: itching   Current Outpatient Prescriptions on File Prior to Visit  Medication Sig Dispense Refill  . aspirin 81 MG tablet Take 81 mg by mouth daily.      . calcium carbonate (TUMS - DOSED IN MG ELEMENTAL CALCIUM) 500 MG chewable tablet Chew 2 tablets by mouth daily.    Marland Kitchen gabapentin (NEURONTIN) 100 MG capsule Take 1 capsule (100 mg total) by mouth 3 (three) times daily. 90 capsule 5  . Multiple Vitamin (MULTIVITAMINS PO) Take by mouth.      Marland Kitchen omeprazole (PRILOSEC) 20 MG capsule Take 1 capsule (20 mg total) by mouth 2 (two) times daily before a meal. 180 capsule 3  . pravastatin (PRAVACHOL) 40 MG tablet Take 40 mg by mouth daily.      Marland Kitchen  ranitidine (ZANTAC) 300 MG capsule Take 1 capsule (300 mg total) by mouth 2 (two) times daily. 60 capsule 11   No current facility-administered medications on file prior to visit.   Review of Systems  Constitutional: Negative for unusual diaphoresis or other sweats  HENT: Negative for ringing in ear Eyes: Negative for double vision or worsening visual disturbance.  Respiratory: Negative for choking and stridor.   Gastrointestinal: Negative for vomiting or other signifcant bowel change Genitourinary: Negative for hematuria or decreased urine volume.  Musculoskeletal: Negative for other  MSK pain or swelling Skin: Negative for color change and worsening wound.  Neurological: Negative for tremors and numbness other than noted  Psychiatric/Behavioral: Negative for decreased concentration or agitation other than above       Objective:   Physical Exam BP 144/72 mmHg  Pulse 97  Temp(Src) 97.3 F (36.3 C) (Oral)  Ht 6' (1.829 m)  Wt 124 lb 3.2 oz (56.337 kg)  BMI 16.84 kg/m2 VS noted,  Constitutional: Pt appears well-developed, well-nourished.  HENT: Head: NCAT.  Right Ear: External ear normal.  Left Ear: External ear normal.  Eyes: . Pupils are equal, round, and reactive to light. Conjunctivae and EOM are normal Neck: Normal range of motion. Neck supple.  Cardiovascular: Normal rate and regular rhythm.   Pulmonary/Chest: Effort normal and breath sounds without rales or wheezing.  Some tender area to right chest t10, ant axillary line, no swelling, redness, rash Abd:  Soft, NT, ND, + BS Neurological: Pt is alert. Not confused , motor grossly intact Skin: Skin is warm. No rash Psychiatric: Pt behavior is normal. No agitation.     Assessment & Plan:

## 2014-08-10 ENCOUNTER — Encounter: Payer: Self-pay | Admitting: Internal Medicine

## 2014-08-10 DIAGNOSIS — H3581 Retinal edema: Secondary | ICD-10-CM | POA: Diagnosis not present

## 2014-08-10 DIAGNOSIS — H34812 Central retinal vein occlusion, left eye: Secondary | ICD-10-CM | POA: Diagnosis not present

## 2014-08-10 LAB — BASIC METABOLIC PANEL
BUN: 27 mg/dL — ABNORMAL HIGH (ref 6–23)
CALCIUM: 9.2 mg/dL (ref 8.4–10.5)
CO2: 24 mEq/L (ref 19–32)
Chloride: 107 mEq/L (ref 96–112)
Creatinine, Ser: 1.37 mg/dL (ref 0.40–1.50)
GFR: 52.21 mL/min — ABNORMAL LOW (ref >60.00)
Glucose, Bld: 70 mg/dL (ref 70–99)
Potassium: 4.8 mEq/L (ref 3.5–5.1)
Sodium: 141 mEq/L (ref 135–145)

## 2014-08-10 LAB — HEPATIC FUNCTION PANEL
ALK PHOS: 161 U/L — AB (ref 39–117)
ALT: 14 U/L (ref 0–53)
AST: 28 U/L (ref 0–37)
Albumin: 3.9 g/dL (ref 3.5–5.2)
Bilirubin, Direct: 0.1 mg/dL (ref 0.0–0.3)
TOTAL PROTEIN: 7.5 g/dL (ref 6.0–8.3)
Total Bilirubin: 0.4 mg/dL (ref 0.2–1.2)

## 2014-08-10 LAB — LIPID PANEL
CHOL/HDL RATIO: 4
Cholesterol: 143 mg/dL (ref 0–200)
HDL: 40.5 mg/dL (ref >39.00)
LDL Cholesterol: 85 mg/dL (ref 0–99)
NonHDL: 102.5
Triglycerides: 90 mg/dL (ref 0.0–149.0)
VLDL: 18 mg/dL (ref 0.0–40.0)

## 2014-09-14 DIAGNOSIS — H34812 Central retinal vein occlusion, left eye: Secondary | ICD-10-CM | POA: Diagnosis not present

## 2014-11-15 DIAGNOSIS — H3531 Nonexudative age-related macular degeneration: Secondary | ICD-10-CM | POA: Diagnosis not present

## 2014-11-23 ENCOUNTER — Other Ambulatory Visit: Payer: Self-pay | Admitting: Cardiovascular Disease

## 2014-11-23 DIAGNOSIS — I6523 Occlusion and stenosis of bilateral carotid arteries: Secondary | ICD-10-CM

## 2014-11-28 ENCOUNTER — Ambulatory Visit (HOSPITAL_COMMUNITY): Payer: Medicare Other | Attending: Internal Medicine

## 2014-11-28 DIAGNOSIS — E785 Hyperlipidemia, unspecified: Secondary | ICD-10-CM | POA: Diagnosis not present

## 2014-11-28 DIAGNOSIS — R0989 Other specified symptoms and signs involving the circulatory and respiratory systems: Secondary | ICD-10-CM | POA: Insufficient documentation

## 2014-11-28 DIAGNOSIS — I6523 Occlusion and stenosis of bilateral carotid arteries: Secondary | ICD-10-CM

## 2014-11-28 DIAGNOSIS — J449 Chronic obstructive pulmonary disease, unspecified: Secondary | ICD-10-CM | POA: Diagnosis not present

## 2014-11-28 DIAGNOSIS — Z87891 Personal history of nicotine dependence: Secondary | ICD-10-CM | POA: Insufficient documentation

## 2014-11-28 DIAGNOSIS — I10 Essential (primary) hypertension: Secondary | ICD-10-CM | POA: Diagnosis not present

## 2014-12-11 ENCOUNTER — Telehealth: Payer: Self-pay | Admitting: Internal Medicine

## 2014-12-11 NOTE — Telephone Encounter (Signed)
Pt request refill for oxyCODONE (ROXICODONE) 5 MG . Please call pt once its done.

## 2014-12-12 MED ORDER — OXYCODONE HCL 5 MG PO TABS: 5.0000 mg | ORAL_TABLET | Freq: Four times a day (QID) | ORAL | Status: DC | PRN

## 2014-12-12 NOTE — Telephone Encounter (Signed)
Done hardcopy to Dahlia  

## 2014-12-13 NOTE — Telephone Encounter (Signed)
Pt advised Rx is available for pick up. Placed in cabinet

## 2015-03-01 DIAGNOSIS — C44311 Basal cell carcinoma of skin of nose: Secondary | ICD-10-CM | POA: Diagnosis not present

## 2015-03-01 DIAGNOSIS — D485 Neoplasm of uncertain behavior of skin: Secondary | ICD-10-CM | POA: Diagnosis not present

## 2015-05-03 ENCOUNTER — Telehealth: Payer: Self-pay | Admitting: Internal Medicine

## 2015-05-03 NOTE — Telephone Encounter (Signed)
10.6.16 Pt requested a refill on Oxycodone HCL 5 MG. Please call pt with questions/when ready to pick up. MS

## 2015-05-03 NOTE — Telephone Encounter (Signed)
MD out of office, please advise on refill, thanks

## 2015-05-04 NOTE — Telephone Encounter (Signed)
Pt advised and will call back to schedule appt

## 2015-05-04 NOTE — Telephone Encounter (Signed)
Needs OV Not seen since 1/16 Thx

## 2015-05-16 DIAGNOSIS — H34812 Central retinal vein occlusion, left eye, with macular edema: Secondary | ICD-10-CM | POA: Diagnosis not present

## 2015-05-21 ENCOUNTER — Other Ambulatory Visit: Payer: Self-pay | Admitting: Cardiovascular Disease

## 2015-05-21 DIAGNOSIS — I6523 Occlusion and stenosis of bilateral carotid arteries: Secondary | ICD-10-CM

## 2015-05-30 ENCOUNTER — Ambulatory Visit (HOSPITAL_COMMUNITY)
Admission: RE | Admit: 2015-05-30 | Discharge: 2015-05-30 | Disposition: A | Discharge status: Home / Self Care | Payer: Medicare Other | Source: Ambulatory Visit | Attending: Internal Medicine | Admitting: Internal Medicine

## 2015-05-30 DIAGNOSIS — I10 Essential (primary) hypertension: Secondary | ICD-10-CM | POA: Insufficient documentation

## 2015-05-30 DIAGNOSIS — I6523 Occlusion and stenosis of bilateral carotid arteries: Secondary | ICD-10-CM | POA: Insufficient documentation

## 2015-05-30 DIAGNOSIS — E785 Hyperlipidemia, unspecified: Secondary | ICD-10-CM | POA: Insufficient documentation

## 2015-08-01 DIAGNOSIS — L308 Other specified dermatitis: Secondary | ICD-10-CM | POA: Diagnosis not present

## 2015-08-01 DIAGNOSIS — B353 Tinea pedis: Secondary | ICD-10-CM | POA: Diagnosis not present

## 2015-08-15 DIAGNOSIS — L308 Other specified dermatitis: Secondary | ICD-10-CM | POA: Diagnosis not present

## 2015-08-15 DIAGNOSIS — B353 Tinea pedis: Secondary | ICD-10-CM | POA: Diagnosis not present

## 2015-08-16 ENCOUNTER — Telehealth: Payer: Self-pay | Admitting: Internal Medicine

## 2015-08-16 NOTE — Telephone Encounter (Signed)
Patient walked in. States that he needs refills. Refused appointment. Last seen 08/02/2014.   Requesting that we write a physical script for omeprazole (PRILOSEC) 20 MG capsule [id number] and give it to him. He will take this and mail it in himself.  He is also asking for a fill of oxycodone  He also has a handicapped parking placard he is wanting filled out. i advised him to hold on to it until you advise that it is ok to proceed with no appointment.

## 2015-08-17 ENCOUNTER — Other Ambulatory Visit: Payer: Self-pay

## 2015-08-17 MED ORDER — OXYCODONE HCL 5 MG PO TABS: 5.0000 mg | ORAL_TABLET | Freq: Four times a day (QID) | ORAL | Status: DC | PRN

## 2015-08-17 NOTE — Telephone Encounter (Signed)
Pt advised in detail and will make an office visit for placard assessment. Pt informed, Rx in cabinet for pt pick up

## 2015-08-17 NOTE — Telephone Encounter (Signed)
Per refill protocol,   I gave prilosec 20 qd for #30 x 1 refill, Needs ROV as last was Jan 2016  OK for small rx oxycodone; needs ROV  Likely ok for handicap parking application, but would prefer he present for evaluation, since it has been over 1 yr since last visit

## 2015-08-24 ENCOUNTER — Encounter: Payer: Self-pay | Admitting: Internal Medicine

## 2015-08-24 ENCOUNTER — Other Ambulatory Visit: Payer: Medicare Other

## 2015-08-24 ENCOUNTER — Ambulatory Visit (INDEPENDENT_AMBULATORY_CARE_PROVIDER_SITE_OTHER): Payer: Medicare Other | Admitting: Internal Medicine

## 2015-08-24 ENCOUNTER — Other Ambulatory Visit (INDEPENDENT_AMBULATORY_CARE_PROVIDER_SITE_OTHER): Payer: Medicare Other

## 2015-08-24 VITALS — BP 140/78 | HR 80 | Temp 98.6°F | Resp 20 | Ht 72.0 in | Wt 115.2 lb

## 2015-08-24 DIAGNOSIS — I10 Essential (primary) hypertension: Secondary | ICD-10-CM | POA: Diagnosis not present

## 2015-08-24 DIAGNOSIS — R7302 Impaired glucose tolerance (oral): Secondary | ICD-10-CM

## 2015-08-24 DIAGNOSIS — E785 Hyperlipidemia, unspecified: Secondary | ICD-10-CM

## 2015-08-24 DIAGNOSIS — N183 Chronic kidney disease, stage 3 (moderate): Secondary | ICD-10-CM | POA: Diagnosis not present

## 2015-08-24 LAB — CBC WITH DIFFERENTIAL/PLATELET
BASOS ABS: 0.1 10*3/uL (ref 0.0–0.1)
Basophils Relative: 0.7 % (ref 0.0–3.0)
EOS ABS: 0.4 10*3/uL (ref 0.0–0.7)
Eosinophils Relative: 5.5 % — ABNORMAL HIGH (ref 0.0–5.0)
HEMATOCRIT: 33.8 % — AB (ref 39.0–52.0)
Hemoglobin: 11 g/dL — ABNORMAL LOW (ref 13.0–17.0)
LYMPHS PCT: 29.8 % (ref 12.0–46.0)
Lymphs Abs: 2.4 10*3/uL (ref 0.7–4.0)
MCHC: 32.4 g/dL (ref 30.0–36.0)
MCV: 92.9 fl (ref 78.0–100.0)
MONOS PCT: 9.3 % (ref 3.0–12.0)
Monocytes Absolute: 0.7 10*3/uL (ref 0.1–1.0)
NEUTROS ABS: 4.4 10*3/uL (ref 1.4–7.7)
Neutrophils Relative %: 54.7 % (ref 43.0–77.0)
PLATELETS: 170 10*3/uL (ref 150.0–400.0)
RBC: 3.64 Mil/uL — AB (ref 4.22–5.81)
RDW: 16.2 % — ABNORMAL HIGH (ref 11.5–15.5)
WBC: 8 10*3/uL (ref 4.0–10.5)

## 2015-08-24 LAB — URINALYSIS, ROUTINE W REFLEX MICROSCOPIC
Bilirubin Urine: NEGATIVE
HGB URINE DIPSTICK: NEGATIVE
NITRITE: NEGATIVE
Specific Gravity, Urine: 1.025 (ref 1.000–1.030)
TOTAL PROTEIN, URINE-UPE24: 30 — AB
Urine Glucose: NEGATIVE
Urobilinogen, UA: 0.2 (ref 0.0–1.0)
pH: 6 (ref 5.0–8.0)

## 2015-08-24 LAB — LIPID PANEL
CHOLESTEROL: 144 mg/dL (ref 0–200)
HDL: 38.4 mg/dL — AB (ref >39.00)
LDL Cholesterol: 83 mg/dL (ref 0–99)
NONHDL: 105.99
TRIGLYCERIDES: 115 mg/dL (ref 0.0–149.0)
Total CHOL/HDL Ratio: 4
VLDL: 23 mg/dL (ref 0.0–40.0)

## 2015-08-24 LAB — BASIC METABOLIC PANEL
BUN: 27 mg/dL — AB (ref 6–23)
CALCIUM: 9.1 mg/dL (ref 8.4–10.5)
CHLORIDE: 101 meq/L (ref 96–112)
CO2: 31 meq/L (ref 19–32)
CREATININE: 1.37 mg/dL (ref 0.40–1.50)
GFR: 52.08 mL/min — ABNORMAL LOW (ref >60.00)
GLUCOSE: 87 mg/dL (ref 70–99)
Potassium: 4.8 mEq/L (ref 3.5–5.1)
Sodium: 135 mEq/L (ref 135–145)

## 2015-08-24 LAB — HEPATIC FUNCTION PANEL
ALBUMIN: 3.8 g/dL (ref 3.5–5.2)
ALT: 11 U/L (ref 0–53)
AST: 25 U/L (ref 0–37)
Alkaline Phosphatase: 186 U/L — ABNORMAL HIGH (ref 39–117)
BILIRUBIN DIRECT: 0.1 mg/dL (ref 0.0–0.3)
TOTAL PROTEIN: 7.7 g/dL (ref 6.0–8.3)
Total Bilirubin: 0.4 mg/dL (ref 0.2–1.2)

## 2015-08-24 LAB — HEMOGLOBIN A1C: Hgb A1c MFr Bld: 5.7 % (ref 4.6–6.5)

## 2015-08-24 NOTE — Assessment & Plan Note (Signed)
stable overall by history and exam, recent data reviewed with pt, and pt to continue medical treatment as before,  to f/u any worsening symptoms or concerns Lab Results  Component Value Date   HGBA1C 6.0 02/02/2014   For f/u a1c

## 2015-08-24 NOTE — Assessment & Plan Note (Signed)
stable overall by history and exam, recent data reviewed with pt, and pt to continue medical treatment as before,  to f/u any worsening symptoms or concerns BP Readings from Last 3 Encounters:  08/24/15 140/78  08/09/14 144/72  07/26/14 144/70

## 2015-08-24 NOTE — Progress Notes (Signed)
Pre visit review using our clinic review tool, if applicable. No additional management support is needed unless otherwise documented below in the visit note. 

## 2015-08-24 NOTE — Assessment & Plan Note (Signed)
stable overall by history and exam, recent data reviewed with pt, and pt to continue medical treatment as before,  to f/u any worsening symptoms or concerns Lab Results  Component Value Date   CREATININE 1.37 08/09/2014

## 2015-08-24 NOTE — Assessment & Plan Note (Signed)
stable overall by history and exam, recent data reviewed with pt, and pt to continue medical treatment as before,  to f/u any worsening symptoms or concerns Lab Results  Component Value Date   LDLCALC 85 08/09/2014    

## 2015-08-24 NOTE — Patient Instructions (Signed)
Please continue all other medications as before, and refills have been done if requested.  Please have the pharmacy call with any other refills you may need.  Please continue your efforts at being more active, low cholesterol diet, and weight control.  You are otherwise up to date with prevention measures today.  Please keep your appointments with your specialists as you may have planned  Your Handicapped Parking form was filled out today  Please go to the LAB in the Basement (turn left off the elevator) for the tests to be done today  You will be contacted by phone if any changes need to be made immediately.  Otherwise, you will receive a letter about your results with an explanation, but please check with MyChart first.  Please remember to sign up for MyChart if you have not done so, as this will be important to you in the future with finding out test results, communicating by private email, and scheduling acute appointments online when needed.  Please return in 6 months, or sooner if needed

## 2015-08-24 NOTE — Progress Notes (Signed)
Subjective:    Patient ID: Sean [name], male    DOB: 1924-08-01, 82 y.o.   MRN: [id number]  HPI   Here for yearly f/u;  Overall doing ok;  Pt denies Chest pain, worsening SOB, DOE, wheezing, orthopnea, PND, worsening LE edema, palpitations, dizziness or syncope.  Pt denies neurological change such as new headache, facial or extremity weakness.  Pt denies polydipsia, polyuria, or low sugar symptoms. Pt states overall good compliance with treatment and medications, good tolerability, and has been trying to follow appropriate diet.  Pt denies worsening depressive symptoms, suicidal ideation or panic. No fever, night sweats, wt loss, loss of appetite, or other constitutional symptoms.  Pt states good ability with ADL's, has low fall risk, home safety reviewed and adequate, no other significant changes in hearing or vision, and only occasionally active with exercise, walks with rolling walker with seat today. No recent falls.   Past Medical History  Diagnosis Date  . BLADDER CANCER 06/17/2007  . VITAMIN D DEFICIENCY 09/18/2008  . GLUCOSE INTOLERANCE 06/17/2007  . HYPERLIPIDEMIA 06/17/2007  . ABUSE, ALCOHOL, IN REMISSION 06/17/2007  . UNSPECIFIED SUDDEN HEARING LOSS 03/20/2009  . HYPERTENSION 06/17/2007  . CAROTID ARTERY DISEASE 07/23/2009  . COPD 09/18/2008  . GERD 06/17/2007  . PEPTIC ULCER DISEASE 06/17/2007  . DIVERTICULOSIS, COLON 06/17/2007  . Alcoholic cirrhosis of liver ([place]) 06/17/2007  . RENAL INSUFFICIENCY 09/18/2008  . OSTEOARTHRITIS, KNEES, BILATERAL 06/19/2009  . LOW BACK PAIN, CHRONIC 12/17/2009  . OSTEOPOROSIS 06/17/2007  . PSA, INCREASED 09/18/2008  . ECHOCARDIOGRAM, ABNORMAL 07/06/2008  . COMPRESSION FRACTURE, LUMBAR VERTEBRAE 08/09/2009  . Adenomatous colon polyp 01/1984  . Impaired glucose tolerance 12/13/2010  . Tubulovillous adenoma 04/2000    Duodenal polyp  . Internal hemorrhoids   . Hiatal hernia    Past Surgical History  Procedure Laterality Date  . S/p bowel  obstruction surgury  12/01  . S/p bilroth ii    . Cholecystectomy  04/2000  . Lumbar laminectomy      x 2  . Inguinal herniorrhapy    . 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 He has never used smokeless tobacco. He reports that he does not drink alcohol or use illicit drugs. family history includes Diabetes in his other. Allergies  Allergen Reactions  . Amoxicillin-Pot Clavulanate     REACTION: rash  . Ibuprofen     REACTION: itching   Current Outpatient Prescriptions on File Prior to Visit  Medication Sig Dispense Refill  . aspirin 81 MG tablet Take 81 mg by mouth daily.      . calcium carbonate (TUMS - DOSED IN MG ELEMENTAL CALCIUM) 500 MG chewable tablet Chew 2 tablets by mouth daily.    Marland Kitchen gabapentin (NEURONTIN) 100 MG capsule Take 1 capsule (100 mg total) by mouth 3 (three) times daily. 90 capsule 5  . Multiple Vitamin (MULTIVITAMINS PO) Take by mouth.      Marland Kitchen omeprazole (PRILOSEC) 20 MG capsule Take 1 capsule (20 mg total) by mouth 2 (two) times daily before a meal. 180 capsule 3  . oxyCODONE (ROXICODONE) 5 MG immediate release tablet Take 1 tablet (5 mg total) by mouth every 6 (six) hours as needed. 30 tablet 0  . pravastatin (PRAVACHOL) 40 MG tablet Take 40 mg by mouth daily.      . ranitidine (ZANTAC) 300 MG capsule Take 1 capsule (300 mg total) by mouth  2 (two) times daily. 60 capsule 11   No current facility-administered medications on file prior to visit.   Review of Systems Constitutional: Negative for increased diaphoresis, other activity, appetite or siginficant weight change other than noted HENT: Negative for worsening hearing loss, ear pain, facial swelling, mouth sores and neck stiffness.   Eyes: Negative for other worsening pain, redness or visual disturbance.  Respiratory: Negative for shortness of breath and wheezing  Cardiovascular: Negative for chest pain and palpitations.    Gastrointestinal: Negative for diarrhea, blood in stool, abdominal distention or other pain Genitourinary: Negative for hematuria, flank pain or change in urine volume.  Musculoskeletal: Negative for myalgias or other joint complaints.  Skin: Negative for color change and wound or drainage.  Neurological: Negative for syncope and numbness. other than noted Hematological: Negative for adenopathy. or other swelling Psychiatric/Behavioral: Negative for hallucinations, SI, self-injury, decreased concentration or other worsening agitation.      Objective:   Physical Exam BP 140/78 mmHg  Pulse 80  Temp(Src) 98.6 F (37 C) (Oral)  Resp 20  Ht 6' (1.829 m)  Wt 115 lb 4 oz (52.277 kg)  BMI 15.63 kg/m2  SpO2 94% VS noted,  Constitutional: Pt is oriented to person, place, and time. Appears well-developed and well-nourished, in no significant distress Head: Normocephalic and atraumatic.  Right Ear: External ear normal.  Left Ear: External ear normal.  Nose: Nose normal.  Mouth/Throat: Oropharynx is clear and moist.  Eyes: Conjunctivae and EOM are normal. Pupils are equal, round, and reactive to light.  Neck: Normal range of motion. Neck supple. No JVD present. No tracheal deviation present or significant neck LA or mass Cardiovascular: Normal rate, regular rhythm, normal heart sounds and intact distal pulses.   Pulmonary/Chest: Effort normal and breath sounds without rales or wheezing  Abdominal: Soft. Bowel sounds are normal. NT. No HSM  Musculoskeletal: Normal range of motion. Exhibits no edema.  Lymphadenopathy:  Has no cervical adenopathy.  Neurological: Pt is alert and oriented to person, place, and time. Pt has normal reflexes. No cranial nerve deficit. Motor grossly intact Skin: Skin is warm and dry. No rash noted.  Psychiatric:  Has normal mood and affect. Behavior is normal.      Assessment & Plan:

## 2015-08-27 ENCOUNTER — Encounter: Payer: Self-pay | Admitting: Internal Medicine

## 2015-08-27 LAB — TSH: TSH: 1.96 u[IU]/mL (ref 0.35–4.50)

## 2015-08-28 ENCOUNTER — Telehealth: Payer: Self-pay

## 2015-08-28 NOTE — Telephone Encounter (Signed)
Please advise patient came into the office and stated that he did not get his prescription for his oxycodone. He would like to have it so that he can get it filled. He stated that a 7 day supply was given to him until he could come in for an appointment to get his month.

## 2015-08-29 DIAGNOSIS — H34812 Central retinal vein occlusion, left eye, with macular edema: Secondary | ICD-10-CM | POA: Diagnosis not present

## 2015-08-29 DIAGNOSIS — H04123 Dry eye syndrome of bilateral lacrimal glands: Secondary | ICD-10-CM | POA: Diagnosis not present

## 2015-08-29 MED ORDER — OXYCODONE HCL 5 MG PO TABS: 5.0000 mg | ORAL_TABLET | Freq: Four times a day (QID) | ORAL | Status: DC | PRN

## 2015-08-29 NOTE — Telephone Encounter (Signed)
Done hardcopy to Corinne  

## 2015-08-29 NOTE — Telephone Encounter (Signed)
Notified pt rx ready for pick-up.../lmb 

## 2015-08-29 NOTE — Addendum Note (Signed)
Addended by: Biagio Borg on: 08/29/2015 12:45 PM   Modules accepted: Orders

## 2015-09-27 ENCOUNTER — Telehealth: Payer: Self-pay | Admitting: Internal Medicine

## 2015-09-27 NOTE — Telephone Encounter (Signed)
Pt came by and needs script for omeprazole (PRILOSEC) 20 MG capsule [id number].  He wants to pick the script up

## 2015-10-01 MED ORDER — OMEPRAZOLE 20 MG PO CPDR: 20.0000 mg | DELAYED_RELEASE_CAPSULE | Freq: Two times a day (BID) | ORAL | Status: AC

## 2015-10-01 NOTE — Telephone Encounter (Signed)
Pt walk-in wanting to pick up script. Printed Md signed gave rx to pt...Sean [name]

## 2015-10-04 DIAGNOSIS — H34812 Central retinal vein occlusion, left eye, with macular edema: Secondary | ICD-10-CM | POA: Diagnosis not present

## 2015-11-08 DIAGNOSIS — H34812 Central retinal vein occlusion, left eye, with macular edema: Secondary | ICD-10-CM | POA: Diagnosis not present

## 2015-12-14 DIAGNOSIS — H34812 Central retinal vein occlusion, left eye, with macular edema: Secondary | ICD-10-CM | POA: Diagnosis not present

## 2016-01-15 DIAGNOSIS — H04123 Dry eye syndrome of bilateral lacrimal glands: Secondary | ICD-10-CM | POA: Diagnosis not present

## 2016-01-15 DIAGNOSIS — H34812 Central retinal vein occlusion, left eye, with macular edema: Secondary | ICD-10-CM | POA: Diagnosis not present

## 2016-02-21 ENCOUNTER — Ambulatory Visit: Payer: Medicare Other | Admitting: Internal Medicine

## 2016-02-27 ENCOUNTER — Encounter: Payer: Self-pay | Admitting: Internal Medicine

## 2016-02-27 ENCOUNTER — Ambulatory Visit (INDEPENDENT_AMBULATORY_CARE_PROVIDER_SITE_OTHER)
Admission: RE | Admit: 2016-02-27 | Discharge: 2016-02-27 | Disposition: A | Discharge status: Home / Self Care | Payer: Medicare Other | Source: Ambulatory Visit | Attending: Internal Medicine | Admitting: Internal Medicine

## 2016-02-27 ENCOUNTER — Other Ambulatory Visit (INDEPENDENT_AMBULATORY_CARE_PROVIDER_SITE_OTHER): Payer: Medicare Other

## 2016-02-27 ENCOUNTER — Ambulatory Visit (INDEPENDENT_AMBULATORY_CARE_PROVIDER_SITE_OTHER): Payer: Medicare Other | Admitting: Internal Medicine

## 2016-02-27 VITALS — BP 142/58 | HR 62 | Temp 98.1°F | Resp 12 | Ht 72.0 in | Wt 103.4 lb

## 2016-02-27 DIAGNOSIS — K219 Gastro-esophageal reflux disease without esophagitis: Secondary | ICD-10-CM | POA: Diagnosis not present

## 2016-02-27 DIAGNOSIS — R634 Abnormal weight loss: Secondary | ICD-10-CM | POA: Diagnosis not present

## 2016-02-27 DIAGNOSIS — R7302 Impaired glucose tolerance (oral): Secondary | ICD-10-CM

## 2016-02-27 DIAGNOSIS — J449 Chronic obstructive pulmonary disease, unspecified: Secondary | ICD-10-CM | POA: Diagnosis not present

## 2016-02-27 DIAGNOSIS — I10 Essential (primary) hypertension: Secondary | ICD-10-CM

## 2016-02-27 DIAGNOSIS — E538 Deficiency of other specified B group vitamins: Secondary | ICD-10-CM

## 2016-02-27 DIAGNOSIS — E785 Hyperlipidemia, unspecified: Secondary | ICD-10-CM

## 2016-02-27 DIAGNOSIS — I4891 Unspecified atrial fibrillation: Secondary | ICD-10-CM | POA: Diagnosis not present

## 2016-02-27 DIAGNOSIS — I499 Cardiac arrhythmia, unspecified: Secondary | ICD-10-CM | POA: Insufficient documentation

## 2016-02-27 DIAGNOSIS — N183 Chronic kidney disease, stage 3 (moderate): Secondary | ICD-10-CM

## 2016-02-27 DIAGNOSIS — K0889 Other specified disorders of teeth and supporting structures: Secondary | ICD-10-CM

## 2016-02-27 LAB — CK: Total CK: 38 U/L (ref 7–232)

## 2016-02-27 LAB — URINALYSIS, ROUTINE W REFLEX MICROSCOPIC
BILIRUBIN URINE: NEGATIVE
Hgb urine dipstick: NEGATIVE
Nitrite: NEGATIVE
PH: 6 (ref 5.0–8.0)
Specific Gravity, Urine: 1.02 (ref 1.000–1.030)
Total Protein, Urine: 30 — AB
Urine Glucose: NEGATIVE
Urobilinogen, UA: 1 (ref 0.0–1.0)

## 2016-02-27 LAB — HEPATIC FUNCTION PANEL
ALK PHOS: 144 U/L — AB (ref 39–117)
ALT: 14 U/L (ref 0–53)
AST: 29 U/L (ref 0–37)
Albumin: 3.9 g/dL (ref 3.5–5.2)
BILIRUBIN DIRECT: 0.1 mg/dL (ref 0.0–0.3)
BILIRUBIN TOTAL: 0.5 mg/dL (ref 0.2–1.2)
TOTAL PROTEIN: 7.5 g/dL (ref 6.0–8.3)

## 2016-02-27 LAB — CBC WITH DIFFERENTIAL/PLATELET
BASOS PCT: 0.6 % (ref 0.0–3.0)
Basophils Absolute: 0 10*3/uL (ref 0.0–0.1)
EOS ABS: 0.1 10*3/uL (ref 0.0–0.7)
Eosinophils Relative: 1.5 % (ref 0.0–5.0)
HEMATOCRIT: 35.3 % — AB (ref 39.0–52.0)
Hemoglobin: 12 g/dL — ABNORMAL LOW (ref 13.0–17.0)
LYMPHS ABS: 1.9 10*3/uL (ref 0.7–4.0)
Lymphocytes Relative: 23.9 % (ref 12.0–46.0)
MCHC: 33.8 g/dL (ref 30.0–36.0)
MCV: 95.3 fl (ref 78.0–100.0)
MONO ABS: 0.6 10*3/uL (ref 0.1–1.0)
Monocytes Relative: 7.9 % (ref 3.0–12.0)
NEUTROS ABS: 5.3 10*3/uL (ref 1.4–7.7)
NEUTROS PCT: 66.1 % (ref 43.0–77.0)
PLATELETS: 163 10*3/uL (ref 150.0–400.0)
RBC: 3.71 Mil/uL — ABNORMAL LOW (ref 4.22–5.81)
RDW: 15.6 % — AB (ref 11.5–15.5)
WBC: 8 10*3/uL (ref 4.0–10.5)

## 2016-02-27 LAB — BASIC METABOLIC PANEL
BUN: 23 mg/dL (ref 6–23)
CO2: 26 meq/L (ref 19–32)
Calcium: 9.5 mg/dL (ref 8.4–10.5)
Chloride: 104 mEq/L (ref 96–112)
Creatinine, Ser: 1.3 mg/dL (ref 0.40–1.50)
GFR: 55.27 mL/min — ABNORMAL LOW (ref >60.00)
GLUCOSE: 92 mg/dL (ref 70–99)
POTASSIUM: 4.6 meq/L (ref 3.5–5.1)
SODIUM: 138 meq/L (ref 135–145)

## 2016-02-27 LAB — LIPID PANEL
Cholesterol: 151 mg/dL (ref 0–200)
HDL: 44.6 mg/dL (ref >39.00)
LDL Cholesterol: 85 mg/dL (ref 0–99)
NonHDL: 106.81
TRIGLYCERIDES: 109 mg/dL (ref 0.0–149.0)
Total CHOL/HDL Ratio: 3
VLDL: 21.8 mg/dL (ref 0.0–40.0)

## 2016-02-27 LAB — VITAMIN B12: Vitamin B-12: 253 pg/mL (ref 211–911)

## 2016-02-27 LAB — IBC PANEL
Iron: 45 ug/dL (ref 42–165)
Saturation Ratios: 13.2 % — ABNORMAL LOW (ref 20.0–50.0)
TRANSFERRIN: 244 mg/dL (ref 212.0–360.0)

## 2016-02-27 LAB — TSH: TSH: 2.05 u[IU]/mL (ref 0.35–4.50)

## 2016-02-27 LAB — C-REACTIVE PROTEIN: CRP: 0.2 mg/dL — AB (ref 0.5–20.0)

## 2016-02-27 LAB — SEDIMENTATION RATE: SED RATE: 6 mm/h (ref 0–20)

## 2016-02-27 LAB — VITAMIN D 25 HYDROXY (VIT D DEFICIENCY, FRACTURES): VITD: 22.51 ng/mL — ABNORMAL LOW (ref 30.00–100.00)

## 2016-02-27 MED ORDER — OXYCODONE HCL 5 MG PO TABS: 5.0000 mg | ORAL_TABLET | Freq: Four times a day (QID) | ORAL | 0 refills | Status: DC | PRN

## 2016-02-27 NOTE — Patient Instructions (Addendum)
Your EKG was consistent with atrial fibrillation today  Please continue all other medications as before, and refills have been done if requested.  Please have the pharmacy call with any other refills you may need.  Please continue your efforts at being more active, low cholesterol diet, and weight control.  You are otherwise up to date with prevention measures today.  Please keep your appointments with your specialists as you may have planned  You will be contacted regarding the referral for: echocardiogram, and cardiology  Please go to the XRAY Department in the Basement (go straight as you get off the elevator) for the x-ray testing  Please go to the LAB in the Basement (turn left off the elevator) for the tests to be done today  You will be contacted by phone if any changes need to be made immediately.  Otherwise, you will receive a letter about your results with an explanation, but please check with MyChart first.  Please remember to sign up for MyChart if you have not done so, as this will be important to you in the future with finding out test results, communicating by private email, and scheduling acute appointments online when needed.  Please return in 3 months, or sooner if needed

## 2016-02-27 NOTE — Progress Notes (Signed)
Subjective:    Patient ID: Sean [name], male    DOB: [birth year], 82 y.o.   MRN: [id number]  HPI  Here to f/u; overall doing ok,  Pt denies chest pain, increasing sob or doe, wheezing, orthopnea, PND, increased LE swelling, palpitations, dizziness or syncope.  Pt denies new neurological symptoms such as new headache, or facial or extremity weakness or numbness.  Pt denies polydipsia, polyuria, or low sugar episode.   Pt denies new neurological symptoms such as new headache, or facial or extremity weakness or numbness.   Pt states overall good compliance with meds, mostly trying to follow appropriate diet, with wt overall down 20 lbs in 18 mo.  Peak wt has been 215 years ago.   Appete ok, c/o ongoing mod to severe reflux, without other abd pain, dysphagia, n/v, bowel change or blood. (no diarrhea , no constipation).  Prilosec 20 bid only works so much, has tried others in past, does not want change.  No early satiety. Last egd 2013 . Wt Readings from Last 3 Encounters:  02/27/16 103 lb 6.4 oz (46.9 kg)  08/24/15 115 lb 4 oz (52.3 kg)  08/09/14 124 lb 3.2 oz (56.3 kg)  Former smoker years ago. Last seen per urology about 2 yrs ago with "good report" and not asked to come back per wife to f/u bladder ca.. Denies urinary symptoms such as dysuria, frequency, urgency, flank pain, hematuria or n/v, fever, chills. Has f/u carotid studies sched soon. Past Medical History:  Diagnosis Date  . ABUSE, ALCOHOL, IN REMISSION 06/17/2007  . Adenomatous colon polyp 01/1984  . Alcoholic cirrhosis of liver ([place]) 06/17/2007  . BLADDER CANCER 06/17/2007  . CAROTID ARTERY DISEASE 07/23/2009  . COMPRESSION FRACTURE, LUMBAR VERTEBRAE 08/09/2009  . COPD 09/18/2008  . DIVERTICULOSIS, COLON 06/17/2007  . ECHOCARDIOGRAM, ABNORMAL 07/06/2008  . GERD 06/17/2007  . GLUCOSE INTOLERANCE 06/17/2007  . Hiatal hernia   . HYPERLIPIDEMIA 06/17/2007  . HYPERTENSION 06/17/2007  . Impaired glucose tolerance 12/13/2010  . Internal  hemorrhoids   . LOW BACK PAIN, CHRONIC 12/17/2009  . OSTEOARTHRITIS, KNEES, BILATERAL 06/19/2009  . OSTEOPOROSIS 06/17/2007  . PEPTIC ULCER DISEASE 06/17/2007  . PSA, INCREASED 09/18/2008  . RENAL INSUFFICIENCY 09/18/2008  . Tubulovillous adenoma 04/2000   Duodenal polyp  . UNSPECIFIED SUDDEN HEARING LOSS 03/20/2009  . VITAMIN D DEFICIENCY 09/18/2008   Past Surgical History:  Procedure Laterality Date  . CHOLECYSTECTOMY  04/2000  . inguinal herniorrhapy    . LUMBAR LAMINECTOMY     x 2  . s/p Bilroth II    . s/p bowel obstruction surgury  12/01  . s/p left middle ear surgury  late 80's   Dr. Juanell Fairly    reports that he quit smoking about 11 years ago. He has never used smokeless tobacco. He reports that he does not drink alcohol or use drugs. family history includes Diabetes in his other. Allergies  Allergen Reactions  . Amoxicillin-Pot Clavulanate     REACTION: rash  . Ibuprofen     REACTION: itching   Current Outpatient Prescriptions on File Prior to Visit  Medication Sig Dispense Refill  . aspirin 81 MG tablet Take 81 mg by mouth daily.      . calcium carbonate (TUMS - DOSED IN MG ELEMENTAL CALCIUM) 500 MG chewable tablet Chew 2 tablets by mouth daily.    Marland Kitchen gabapentin (NEURONTIN) 100 MG capsule Take 1 capsule (100 mg total) by mouth 3 (three) times daily. 90 capsule 5  . Multiple  Vitamin (MULTIVITAMINS PO) Take by mouth.      Marland Kitchen omeprazole (PRILOSEC) 20 MG capsule Take 1 capsule (20 mg total) by mouth 2 (two) times daily before a meal. 180 capsule 3  . oxyCODONE (ROXICODONE) 5 MG immediate release tablet Take 1 tablet (5 mg total) by mouth every 6 (six) hours as needed. 120 tablet 0  . pravastatin (PRAVACHOL) 40 MG tablet Take 40 mg by mouth daily.      . ranitidine (ZANTAC) 300 MG capsule Take 1 capsule (300 mg total) by mouth 2 (two) times daily. 60 capsule 11   No current facility-administered medications on file prior to visit.    Review of Systems  Constitutional:  Negative for unusual diaphoresis or night sweats HENT: Negative for ear swelling or discharge Eyes: Negative for worsening visual haziness  Respiratory: Negative for choking and stridor.   Gastrointestinal: Negative for distension or worsening eructation Genitourinary: Negative for retention or change in urine volume.  Musculoskeletal: Negative for other MSK pain or swelling Skin: Negative for color change and worsening wound Neurological: Negative for tremors and numbness other than noted  Psychiatric/Behavioral: Negative for decreased concentration or agitation other than above       Objective:   Physical Exam BP (!) 142/58   Pulse 62   Temp 98.1 F (36.7 C) (Oral)   Resp 12   Ht 6' (1.829 m)   Wt 103 lb 6.4 oz (46.9 kg)   SpO2 94%   BMI 14.02 kg/m  Walks with rollater VS noted, thin, emaciated Constitutional: Pt appears in no apparent distress HENT: Head: NCAT.  Right Ear: External ear normal.  Left Ear: External ear normal.  Eyes: . Pupils are equal, round, and reactive to light. Conjunctivae and EOM are normal Neck: Normal range of motion. Neck supple.  Cardiovascular: Normal rate and regular rhythm.  with ectopy vs IRIR Pulmonary/Chest: Effort normal and breath sounds without rales or wheezing.  Abd:  Soft, NT, ND, + BS Neurological: Pt is alert. Not confused , motor grossly intact Skin: Skin is warm. No rash, no LE edema Psychiatric: Pt behavior is normal. No agitation.   Today ECG - personally reviewed by me  Consistent with atrial fibrillation  -? Old anteroseptal infarct.   ABNORMAL     Assessment & Plan:

## 2016-02-27 NOTE — Progress Notes (Signed)
Pre visit review using our clinic review tool, if applicable. No additional management support is needed unless otherwise documented below in the visit note. 

## 2016-03-02 NOTE — Assessment & Plan Note (Addendum)
New onset, rate controlled, volume stable, for cont'd tx as current, echo, and cardiology referral, may need long term anticoagulation but I am concerned about this wt loss, frailty, advanced age and risk of fall  Note:  Total time for pt hx, exam, review of record with pt in the room, determination of diagnoses and plan for further eval and tx is > 40 min, with over 50% spent in coordination and counseling of patient

## 2016-03-02 NOTE — Assessment & Plan Note (Signed)
stable overall by history and exam, recent data reviewed with pt, and pt to continue medical treatment as before,  to f/u any worsening symptoms or concerns BP Readings from Last 3 Encounters:  02/27/16 (!) 142/58  08/24/15 140/78  08/09/14 (!) 144/72

## 2016-03-02 NOTE — Assessment & Plan Note (Signed)
stable overall by history and exam, recent data reviewed with pt, and pt to continue medical treatment as before,  to f/u any worsening symptoms or concerns Lab Results  Component Value Date   HGBA1C 5.7 08/24/2015

## 2016-03-02 NOTE — Assessment & Plan Note (Signed)
Declines change in PPI or GI referral,  to f/u any worsening symptoms or concerns

## 2016-03-02 NOTE — Assessment & Plan Note (Deleted)
ECG neg for afib, cont to follow

## 2016-03-02 NOTE — Assessment & Plan Note (Signed)
stable overall by history and exam, recent data reviewed with pt, and pt to continue medical treatment as before,  to f/u any worsening symptoms or concerns Lab Results  Component Value Date   LDLCALC 85 02/27/2016

## 2016-03-02 NOTE — Assessment & Plan Note (Signed)
Etiology unclear, for cxr, and labs as ordered,  to f/u any worsening symptoms or concerns

## 2016-03-10 ENCOUNTER — Other Ambulatory Visit: Payer: Self-pay

## 2016-03-10 ENCOUNTER — Ambulatory Visit (HOSPITAL_COMMUNITY): Payer: Medicare Other | Attending: Cardiovascular Disease

## 2016-03-10 DIAGNOSIS — I4891 Unspecified atrial fibrillation: Secondary | ICD-10-CM | POA: Diagnosis not present

## 2016-03-10 DIAGNOSIS — E785 Hyperlipidemia, unspecified: Secondary | ICD-10-CM | POA: Insufficient documentation

## 2016-03-10 DIAGNOSIS — I119 Hypertensive heart disease without heart failure: Secondary | ICD-10-CM | POA: Insufficient documentation

## 2016-03-10 DIAGNOSIS — I071 Rheumatic tricuspid insufficiency: Secondary | ICD-10-CM | POA: Diagnosis not present

## 2016-03-10 DIAGNOSIS — I371 Nonrheumatic pulmonary valve insufficiency: Secondary | ICD-10-CM | POA: Insufficient documentation

## 2016-03-10 DIAGNOSIS — I351 Nonrheumatic aortic (valve) insufficiency: Secondary | ICD-10-CM | POA: Insufficient documentation

## 2016-03-10 DIAGNOSIS — I340 Nonrheumatic mitral (valve) insufficiency: Secondary | ICD-10-CM | POA: Insufficient documentation

## 2016-03-10 DIAGNOSIS — R9431 Abnormal electrocardiogram [ECG] [EKG]: Secondary | ICD-10-CM | POA: Diagnosis present

## 2016-03-11 ENCOUNTER — Encounter: Payer: Self-pay | Admitting: Internal Medicine

## 2016-03-11 DIAGNOSIS — I429 Cardiomyopathy, unspecified: Secondary | ICD-10-CM

## 2016-03-11 DIAGNOSIS — I351 Nonrheumatic aortic (valve) insufficiency: Secondary | ICD-10-CM

## 2016-03-11 HISTORY — DX: Cardiomyopathy, unspecified: I42.9

## 2016-03-11 HISTORY — DX: Nonrheumatic aortic (valve) insufficiency: I35.1

## 2016-03-14 ENCOUNTER — Encounter: Payer: Self-pay | Admitting: Cardiology

## 2016-03-17 ENCOUNTER — Encounter: Payer: Self-pay | Admitting: Cardiology

## 2016-03-17 ENCOUNTER — Ambulatory Visit (INDEPENDENT_AMBULATORY_CARE_PROVIDER_SITE_OTHER): Payer: Medicare Other | Admitting: Cardiology

## 2016-03-17 ENCOUNTER — Encounter (INDEPENDENT_AMBULATORY_CARE_PROVIDER_SITE_OTHER): Payer: Self-pay

## 2016-03-17 VITALS — BP 122/64 | HR 65 | Ht 72.0 in | Wt 104.8 lb

## 2016-03-17 DIAGNOSIS — I481 Persistent atrial fibrillation: Secondary | ICD-10-CM | POA: Diagnosis not present

## 2016-03-17 DIAGNOSIS — I4819 Other persistent atrial fibrillation: Secondary | ICD-10-CM

## 2016-03-17 MED ORDER — RIVAROXABAN 15 MG PO TABS: 15.0000 mg | ORAL_TABLET | Freq: Every day | ORAL | 0 refills | Status: DC

## 2016-03-17 MED ORDER — RIVAROXABAN 15 MG PO TABS: 15.0000 mg | ORAL_TABLET | Freq: Every day | ORAL | 10 refills | Status: DC

## 2016-03-17 NOTE — Progress Notes (Signed)
Electrophysiology Office Note   Date:  03/17/2016   ID:  Sean [name], DOB 06/12/1925, MRN [id number]  PCP:  Cathlean Cower, MD  Primary Electrophysiologist:  Dacie Mandel Meredith Leeds, MD    Chief Complaint  Patient presents with  . Advice Only    afib     History of Present Illness: Sean [name] is a 82 y.o. male who presents today for electrophysiology evaluation.   He presents for workup of atrial fibrillation.  This was found on routine visit by his PCP. He says that he has been feeling well without any major complaint. He does have fatigue, but this is been going on for many years. He also has leg weakness due to a neuropathy. He says that he has been weak which has been worsening due to his leg weakness.   Today, he denies symptoms of palpitations, chest pain, shortness of breath, orthopnea, PND, lower extremity edema, claudication, dizziness, presyncope, syncope, bleeding, or neurologic sequela. The patient is tolerating medications without difficulties and is otherwise without complaint today.    Past Medical History:  Diagnosis Date  . ABUSE, ALCOHOL, IN REMISSION 06/17/2007  . Adenomatous colon polyp 01/1984  . Alcoholic cirrhosis of liver ([place]) 06/17/2007  . Aortic regurgitation 03/11/2016  . BLADDER CANCER 06/17/2007  . Cardiomyopathy ([place]) 03/11/2016  . CAROTID ARTERY DISEASE 07/23/2009  . COMPRESSION FRACTURE, LUMBAR VERTEBRAE 08/09/2009  . COPD 09/18/2008  . DIVERTICULOSIS, COLON 06/17/2007  . ECHOCARDIOGRAM, ABNORMAL 07/06/2008  . GERD 06/17/2007  . GLUCOSE INTOLERANCE 06/17/2007  . Hiatal hernia   . HYPERLIPIDEMIA 06/17/2007  . HYPERTENSION 06/17/2007  . Impaired glucose tolerance 12/13/2010  . Internal hemorrhoids   . LOW BACK PAIN, CHRONIC 12/17/2009  . OSTEOARTHRITIS, KNEES, BILATERAL 06/19/2009  . OSTEOPOROSIS 06/17/2007  . PEPTIC ULCER DISEASE 06/17/2007  . PSA, INCREASED 09/18/2008  . RENAL INSUFFICIENCY 09/18/2008  . Tubulovillous adenoma 04/2000   Duodenal polyp  . UNSPECIFIED SUDDEN HEARING LOSS 03/20/2009  . VITAMIN D DEFICIENCY 09/18/2008   Past Surgical History:  Procedure Laterality Date  . CHOLECYSTECTOMY  04/2000  . inguinal herniorrhapy    . LUMBAR LAMINECTOMY     x 2  . s/p Bilroth II    . s/p bowel obstruction surgury  12/01  . s/p left middle ear surgury  late 80's   Dr. Juanell Fairly     Current Outpatient Prescriptions  Medication Sig Dispense Refill  . calcium carbonate (TUMS - DOSED IN MG ELEMENTAL CALCIUM) 500 MG chewable tablet Chew 2 tablets by mouth daily.    . Multiple Vitamin (MULTIVITAMINS PO) Take by mouth.      Marland Kitchen omeprazole (PRILOSEC) 20 MG capsule Take 1 capsule (20 mg total) by mouth 2 (two) times daily before a meal. 180 capsule 3  . oxyCODONE (ROXICODONE) 5 MG immediate release tablet Take 1 tablet (5 mg total) by mouth every 6 (six) hours as needed. 120 tablet 0  . pravastatin (PRAVACHOL) 40 MG tablet Take 40 mg by mouth daily.      . ranitidine (ZANTAC) 300 MG capsule Take 1 capsule (300 mg total) by mouth 2 (two) times daily. 60 capsule 11   No current facility-administered medications for this visit.     Allergies:   Amoxicillin-pot clavulanate and Ibuprofen   Social History:  The patient  reports that he quit smoking about 11 years ago. He has never used smokeless tobacco. He reports that he does not drink alcohol or use drugs.   Family History:  The patient's family  history includes Breast cancer in his sister; Diabetes in his other; Emphysema in his father; Heart attack in his mother.    ROS:  Please see the history of present illness.   Otherwise, review of systems is positive for weakness, fatigue.   All other systems are reviewed and negative.    PHYSICAL EXAM: VS:  BP 122/64   Pulse 65   Ht 6' (1.829 m)   Wt 104 lb 12.8 oz (47.5 kg)   BMI 14.21 kg/m  , BMI Body mass index is 14.21 kg/m. GEN: Well nourished, well developed, in no acute distress  HEENT: normal  Neck: no JVD,  carotid bruits, or masses Cardiac: irregular rhythm; no murmurs, rubs, or gallops,no edema  Respiratory:  clear to auscultation bilaterally, normal work of breathing GI: soft, nontender, nondistended, + BS MS: no deformity or atrophy  Skin: warm and dry Neuro:  Strength and sensation are intact Psych: euthymic mood, full affect  EKG:  EKG is ordered today. Personal review of the ekg ordered shows atrial fibrillation, septal infarct, rate 65  Recent Labs: 02/27/2016: ALT 14; BUN 23; Creatinine, Ser 1.30; Hemoglobin 12.0; Platelets 163.0; Potassium 4.6; Sodium 138; TSH 2.05    Lipid Panel     Component Value Date/Time   CHOL 151 02/27/2016 1538   TRIG 109.0 02/27/2016 1538   HDL 44.60 02/27/2016 1538   CHOLHDL 3 02/27/2016 1538   VLDL 21.8 02/27/2016 1538   LDLCALC 85 02/27/2016 1538     Wt Readings from Last 3 Encounters:  03/17/16 104 lb 12.8 oz (47.5 kg)  02/27/16 103 lb 6.4 oz (46.9 kg)  08/24/15 115 lb 4 oz (52.3 kg)      Other studies Reviewed: Additional studies/ records that were reviewed today include: TTE 03/10/16  Review of the above records today demonstrates:  - Left ventricle: The cavity size was normal. Wall thickness was   normal. Systolic function was normal. The estimated ejection   fraction was in the range of 50% to 55%. Wall motion was normal;   there were no regional wall motion abnormalities. - Aortic valve: There was moderate regurgitation. - Mitral valve: There was moderate regurgitation. - Left atrium: The atrium was mildly dilated. - Right ventricle: Systolic function was mildly reduced. - Right atrium: The atrium was moderately dilated. - Tricuspid valve: There was moderate regurgitation. - Pulmonic valve: There was moderate regurgitation. - Pulmonary arteries: Systolic pressure was mildly increased. PA   peak pressure: 32 mm Hg (S).   ASSESSMENT AND PLAN:  1.  Atrial fibrillation: Currently has an elevated stroke risk, but also has a fall  risk. I had a lengthy discussion with both him and his wife about the risks and benefits of anticoagulation. He is at a high risk of stroke at 3.2%, but due to his frailty, lower extremity neuropathy, and overall weakness, is also had a higher fall risk. He had a fall about a month ago in the shower and they have since gotten a shower chair. We Arneisha Kincannon try him on anticoagulation and the rivaroxaban ive today to see if he Chantelle Verdi tolerate this. We'll see him back in 3 months to reassess.   This patients CHA2DS2-VASc Score and unadjusted Ischemic Stroke Rate (% per year) is equal to 3.2 % stroke rate/year from a score of 3  Above score calculated as 1 point each if present [CHF, HTN, DM, Vascular=MI/PAD/Aortic Plaque, Age if 65-74, or Male] Above score calculated as 2 points each if present [Age > 75,  or Stroke/TIA/TE]  2. Hypertension: well controlled  Current medicines are reviewed at length with the patient today.   The patient does not have concerns regarding his medicines.  The following changes were made today:  xarelto  Labs/ tests ordered today include:  No orders of the defined types were placed in this encounter.    Disposition:   FU with Gayna Braddy  3 months  Signed, Amylynn Fano Meredith Leeds, MD  03/17/2016 3:37 PM     [address] [place] [id number] [phone] (office) [phone] (fax)

## 2016-03-17 NOTE — Patient Instructions (Signed)
Medication Instructions:    Your physician has recommended you make the following change in your medication:      1) START Xarelto 15 mg once daily at suppertime.  --- If you need a refill on your cardiac medications before your next appointment, please call your pharmacy. ---  Labwork:  None ordered  Testing/Procedures:  None ordered  Follow-Up:  Your physician recommends that you schedule a follow-up appointment in: 3 months with Dr. Curt Bears.  Thank you for choosing [facility]!!   Trinidad Curet, RN [phone]  Any Other Special Instructions Will Be Listed Below (If Applicable).  Rivaroxaban oral tablets What is this medicine? RIVAROXABAN (ri va ROX a ban) is an anticoagulant (blood thinner). It is used to treat blood clots in the lungs or in the veins. It is also used after knee or hip surgeries to prevent blood clots. It is also used to lower the chance of stroke in people with a medical condition called atrial fibrillation. This medicine may be used for other purposes; ask your health care provider or pharmacist if you have questions. What should I tell my health care provider before I take this medicine? They need to know if you have any of these conditions: -bleeding disorders -bleeding in the brain -blood in your stools (black or tarry stools) or if you have blood in your vomit -history of stomach bleeding -kidney disease -liver disease -low blood counts, like low white cell, platelet, or red cell counts -recent or planned spinal or epidural procedure -take medicines that treat or prevent blood clots -an unusual or allergic reaction to rivaroxaban, other medicines, foods, dyes, or preservatives -pregnant or trying to get pregnant -breast-feeding How should I use this medicine? Take this medicine by mouth with a glass of water. Follow the directions on the prescription label. Take your medicine at regular intervals. Do not take it more often than directed.  Do not stop taking except on your doctor's advice. Stopping this medicine may increase your risk of a blood clot. Be sure to refill your prescription before you run out of medicine. If you are taking this medicine after hip or knee replacement surgery, take it with or without food. If you are taking this medicine for atrial fibrillation, take it with your evening meal. If you are taking this medicine to treat blood clots, take it with food at the same time each day. If you are unable to swallow your tablet, you may crush the tablet and mix it in applesauce. Then, immediately eat the applesauce. You should eat more food right after you eat the applesauce containing the crushed tablet. Talk to your pediatrician regarding the use of this medicine in children. Special care may be needed. Overdosage: If you think you have taken too much of this medicine contact a poison control center or emergency room at once. NOTE: This medicine is only for you. Do not share this medicine with others. What if I miss a dose? If you take your medicine once a day and miss a dose, take the missed dose as soon as you remember. If you take your medicine twice a day and miss a dose, take the missed dose immediately. In this instance, 2 tablets may be taken at the same time. The next day you should take 1 tablet twice a day as directed. What may interact with this medicine? -aspirin and aspirin-like medicines -certain antibiotics like erythromycin, azithromycin, and clarithromycin -certain medicines for fungal infections like ketoconazole and itraconazole -certain medicines  for irregular heart beat like amiodarone, quinidine, dronedarone -certain medicines for seizures like carbamazepine, phenytoin -certain medicines that treat or prevent blood clots like warfarin, enoxaparin, and dalteparin -conivaptan -diltiazem -felodipine -indinavir -lopinavir; ritonavir -NSAIDS, medicines for pain and inflammation, like ibuprofen or  naproxen -ranolazine -rifampin -ritonavir -St. [name]'s wort -verapamil This list may not describe all possible interactions. Give your health care provider a list of all the medicines, herbs, non-prescription drugs, or dietary supplements you use. Also tell them if you smoke, drink alcohol, or use illegal drugs. Some items may interact with your medicine. What should I watch for while using this medicine? Visit your doctor or health care professional for regular checks on your progress. Your condition will be monitored carefully while you are receiving this medicine. Notify your doctor or health care professional and seek emergency treatment if you develop breathing problems; changes in vision; chest pain; severe, sudden headache; pain, swelling, warmth in the leg; trouble speaking; sudden numbness or weakness of the face, arm, or leg. These can be signs that your condition has gotten worse. If you are going to have surgery, tell your doctor or health care professional that you are taking this medicine. Tell your health care professional that you use this medicine before you have a spinal or epidural procedure. Sometimes people who take this medicine have bleeding problems around the spine when they have a spinal or epidural procedure. This bleeding is very rare. If you have a spinal or epidural procedure while on this medicine, call your health care professional immediately if you have back pain, numbness or tingling (especially in your legs and feet), muscle weakness, paralysis, or loss of bladder or bowel control. Avoid sports and activities that might cause injury while you are using this medicine. Severe falls or injuries can cause unseen bleeding. Be careful when using sharp tools or knives. Consider using an Copy. Take special care brushing or flossing your teeth. Report any injuries, bruising, or red spots on the skin to your doctor or health care professional. What side effects may I  notice from receiving this medicine? Side effects that you should report to your doctor or health care professional as soon as possible: -allergic reactions like skin rash, itching or hives, swelling of the face, lips, or tongue -back pain -redness, blistering, peeling or loosening of the skin, including inside the mouth -signs and symptoms of bleeding such as bloody or black, tarry stools; red or dark-brown urine; spitting up blood or brown material that looks like coffee grounds; red spots on the skin; unusual bruising or bleeding from the eye, gums, or nose Side effects that usually do not require medical attention (Report these to your doctor or health care professional if they continue or are bothersome.): -dizziness -muscle pain This list may not describe all possible side effects. Call your doctor for medical advice about side effects. You may report side effects to FDA at 1-800-FDA-1088. Where should I keep my medicine? Keep out of the reach of children. Store at room temperature between 15 and 30 degrees C (59 and 86 degrees F). Throw away any unused medicine after the expiration date. NOTE: This sheet is a summary. It may not cover all possible information. If you have questions about this medicine, talk to your doctor, pharmacist, or health care provider.    2016, [employer]vier/[employer] Standard. (2014-07-12 12:45:34)

## 2016-03-18 DIAGNOSIS — H34812 Central retinal vein occlusion, left eye, with macular edema: Secondary | ICD-10-CM | POA: Diagnosis not present

## 2016-04-21 DIAGNOSIS — H34812 Central retinal vein occlusion, left eye, with macular edema: Secondary | ICD-10-CM | POA: Diagnosis not present

## 2016-04-28 DIAGNOSIS — H903 Sensorineural hearing loss, bilateral: Secondary | ICD-10-CM | POA: Diagnosis not present

## 2016-04-28 DIAGNOSIS — H6123 Impacted cerumen, bilateral: Secondary | ICD-10-CM | POA: Diagnosis not present

## 2016-06-05 DIAGNOSIS — Z23 Encounter for immunization: Secondary | ICD-10-CM | POA: Diagnosis not present

## 2016-06-17 DIAGNOSIS — H34812 Central retinal vein occlusion, left eye, with macular edema: Secondary | ICD-10-CM | POA: Diagnosis not present

## 2016-06-18 ENCOUNTER — Encounter: Payer: Self-pay | Admitting: Cardiology

## 2016-06-18 ENCOUNTER — Ambulatory Visit (INDEPENDENT_AMBULATORY_CARE_PROVIDER_SITE_OTHER): Payer: Medicare Other | Admitting: Cardiology

## 2016-06-18 VITALS — BP 110/60 | HR 82 | Ht 69.0 in | Wt 105.0 lb

## 2016-06-18 DIAGNOSIS — I481 Persistent atrial fibrillation: Secondary | ICD-10-CM

## 2016-06-18 DIAGNOSIS — I4819 Other persistent atrial fibrillation: Secondary | ICD-10-CM

## 2016-06-18 NOTE — Patient Instructions (Signed)
Medication Instructions:   Your physician recommends that you continue on your current medications as directed. Please refer to the Current Medication list given to you today.  If you need a refill on your cardiac medications before your next appointment, please call your pharmacy.  Labwork: NONE ORDERED  TODAY    Testing/Procedures: NONE ORDERED  TODAY    Follow-Up:  Your physician wants you to follow-up in:  IN  6 MONTHS WITH DR CAMNITZ   You will receive a reminder letter in the mail two months in advance. If you don't receive a letter, please call our office to schedule the follow-up appointment.      Any Other Special Instructions Will Be Listed Below (If Applicable).                                                                                                                                                   

## 2016-06-18 NOTE — Progress Notes (Signed)
Electrophysiology Office Note   Date:  06/18/2016   ID:  YEELENG SMET, DOB April 23, 1925, MRN [id number]  PCP:  Cathlean Cower, MD  Primary Electrophysiologist:  Vy Badley Meredith Leeds, MD    Chief Complaint  Patient presents with  . Follow-up    AFIB     History of Present Illness: Sean [name] is a 82 y.o. male who presents today for electrophysiology evaluation.   He presents for workup of atrial fibrillation.  This was found on routine visit by his PCP. He says that he has been feeling well without any major complaint. He does have fatigue, but this is been going on for many years. He also has leg weakness due to a neuropathy.   He has had one fall since his most recent visit. He was found on the floor in his carport by his wife. Otherwise he is doing well and not having any major complaints. He is tolerating the Xarelto without much increased bleeding.   Today, he denies symptoms of palpitations, chest pain, shortness of breath, orthopnea, PND, lower extremity edema, claudication, dizziness, presyncope, syncope, bleeding, or neurologic sequela. The patient is tolerating medications without difficulties and is otherwise without complaint today.    Past Medical History:  Diagnosis Date  . ABUSE, ALCOHOL, IN REMISSION 06/17/2007  . Adenomatous colon polyp 01/1984  . Alcoholic cirrhosis of liver ([place]) 06/17/2007  . Aortic regurgitation 03/11/2016  . BLADDER CANCER 06/17/2007  . Cardiomyopathy ([place]) 03/11/2016  . CAROTID ARTERY DISEASE 07/23/2009  . COMPRESSION FRACTURE, LUMBAR VERTEBRAE 08/09/2009  . COPD 09/18/2008  . DIVERTICULOSIS, COLON 06/17/2007  . ECHOCARDIOGRAM, ABNORMAL 07/06/2008  . GERD 06/17/2007  . GLUCOSE INTOLERANCE 06/17/2007  . Hiatal hernia   . HYPERLIPIDEMIA 06/17/2007  . HYPERTENSION 06/17/2007  . Impaired glucose tolerance 12/13/2010  . Internal hemorrhoids   . LOW BACK PAIN, CHRONIC 12/17/2009  . OSTEOARTHRITIS, KNEES, BILATERAL 06/19/2009  . OSTEOPOROSIS  06/17/2007  . PEPTIC ULCER DISEASE 06/17/2007  . PSA, INCREASED 09/18/2008  . RENAL INSUFFICIENCY 09/18/2008  . Tubulovillous adenoma 04/2000   Duodenal polyp  . UNSPECIFIED SUDDEN HEARING LOSS 03/20/2009  . VITAMIN D DEFICIENCY 09/18/2008   Past Surgical History:  Procedure Laterality Date  . CHOLECYSTECTOMY  04/2000  . inguinal herniorrhapy    . LUMBAR LAMINECTOMY     x 2  . s/p Bilroth II    . s/p bowel obstruction surgury  12/01  . s/p left middle ear surgury  late 80's   Dr. Juanell Fairly     Current Outpatient Prescriptions  Medication Sig Dispense Refill  . calcium carbonate (TUMS - DOSED IN MG ELEMENTAL CALCIUM) 500 MG chewable tablet Chew 2 tablets by mouth daily.    . Multiple Vitamin (MULTIVITAMINS PO) Take by mouth.      Marland Kitchen omeprazole (PRILOSEC) 20 MG capsule Take 1 capsule (20 mg total) by mouth 2 (two) times daily before a meal. 180 capsule 3  . oxyCODONE (ROXICODONE) 5 MG immediate release tablet Take 1 tablet (5 mg total) by mouth every 6 (six) hours as needed. 120 tablet 0  . pravastatin (PRAVACHOL) 40 MG tablet Take 40 mg by mouth daily.      . ranitidine (ZANTAC) 300 MG capsule Take 1 capsule (300 mg total) by mouth 2 (two) times daily. 60 capsule 11  . Rivaroxaban (XARELTO) 15 MG TABS tablet Take 1 tablet (15 mg total) by mouth daily with supper. 30 tablet 0  . Rivaroxaban (XARELTO) 15 MG TABS tablet Take 1 tablet (15  mg total) by mouth daily with supper. 30 tablet 10   No current facility-administered medications for this visit.     Allergies:   Amoxicillin-pot clavulanate and Ibuprofen   Social History:  The patient  reports that he quit smoking about 11 years ago. He has never used smokeless tobacco. He reports that he does not drink alcohol or use drugs.   Family History:  The patient's family history includes Breast cancer in his sister; Diabetes in his other; Emphysema in his father; Heart attack in his mother.    ROS:  Please see the history of present  illness.   Otherwise, review of systems is positive for Fatigue, leg pain, hearing loss, dyspnea on exertion, constipation, back and muscle pain, rash, easy bruising. He.   All other systems are reviewed and negative.    PHYSICAL EXAM: VS:  BP 110/60   Pulse 82   Ht 5\' 9"  (1.753 m)   Wt 105 lb (47.6 kg)   BMI 15.51 kg/m  , BMI Body mass index is 15.51 kg/m. GEN: Well nourished, well developed, in no acute distress  HEENT: normal  Neck: no JVD, carotid bruits, or masses Cardiac: irregular rhythm; no murmurs, rubs, or gallops,no edema  Respiratory:  clear to auscultation bilaterally, normal work of breathing GI: soft, nontender, nondistended, + BS MS: no deformity or atrophy  Skin: warm and dry Neuro:  Strength and sensation are intact Psych: euthymic mood, full affect  EKG:  EKG is ordered today. Personal review of the ekg ordered 03/17/16 shows atrial fibrillation, septal infarct, rate 65  Recent Labs: 02/27/2016: ALT 14; BUN 23; Creatinine, Ser 1.30; Hemoglobin 12.0; Platelets 163.0; Potassium 4.6; Sodium 138; TSH 2.05    Lipid Panel     Component Value Date/Time   CHOL 151 02/27/2016 1538   TRIG 109.0 02/27/2016 1538   HDL 44.60 02/27/2016 1538   CHOLHDL 3 02/27/2016 1538   VLDL 21.8 02/27/2016 1538   LDLCALC 85 02/27/2016 1538     Wt Readings from Last 3 Encounters:  06/18/16 105 lb (47.6 kg)  03/17/16 104 lb 12.8 oz (47.5 kg)  02/27/16 103 lb 6.4 oz (46.9 kg)      Other studies Reviewed: Additional studies/ records that were reviewed today include: TTE 03/10/16  Review of the above records today demonstrates:  - Left ventricle: The cavity size was normal. Wall thickness was   normal. Systolic function was normal. The estimated ejection   fraction was in the range of 50% to 55%. Wall motion was normal;   there were no regional wall motion abnormalities. - Aortic valve: There was moderate regurgitation. - Mitral valve: There was moderate regurgitation. - Left  atrium: The atrium was mildly dilated. - Right ventricle: Systolic function was mildly reduced. - Right atrium: The atrium was moderately dilated. - Tricuspid valve: There was moderate regurgitation. - Pulmonic valve: There was moderate regurgitation. - Pulmonary arteries: Systolic pressure was mildly increased. PA   peak pressure: 32 mm Hg (S).   ASSESSMENT AND PLAN:  1.  Atrial fibrillation: He remains in atrial fibrillation today. He has been doing well with his Xarelto not having any major issues. We'll continue him on his current dose of Xarelto. He is rate controlled today and we'll not adjust his other medications.  This patients CHA2DS2-VASc Score and unadjusted Ischemic Stroke Rate (% per year) is equal to 3.2 % stroke rate/year from a score of 3  Above score calculated as 1 point each if present [CHF, HTN,  DM, Vascular=MI/PAD/Aortic Plaque, Age if 46-74, or Male] Above score calculated as 2 points each if present [Age > 75, or Stroke/TIA/TE]  2. Hypertension: well controlled  Current medicines are reviewed at length with the patient today.   The patient does not have concerns regarding his medicines.  The following changes were made today:  xarelto  Labs/ tests ordered today include:  No orders of the defined types were placed in this encounter.    Disposition:   FU with Terren Jandreau  3 months  Signed, Orvella Digiulio Meredith Leeds, MD  06/18/2016 11:36 AM     [facility] [address] [place] [address] [phone] (office) [phone] (fax)

## 2016-06-20 ENCOUNTER — Inpatient Hospital Stay (HOSPITAL_COMMUNITY)
Admission: EM | Admit: 2016-06-20 | Discharge: 2016-06-25 | DRG: 493 | Disposition: A | Discharge status: Skilled Nursing Facility | Payer: Medicare Other | Attending: Family Medicine | Admitting: Family Medicine

## 2016-06-20 ENCOUNTER — Encounter (HOSPITAL_COMMUNITY): Payer: Self-pay

## 2016-06-20 ENCOUNTER — Emergency Department (HOSPITAL_COMMUNITY): Payer: Medicare Other

## 2016-06-20 DIAGNOSIS — H9190 Unspecified hearing loss, unspecified ear: Secondary | ICD-10-CM | POA: Diagnosis present

## 2016-06-20 DIAGNOSIS — K7030 Alcoholic cirrhosis of liver without ascites: Secondary | ICD-10-CM | POA: Diagnosis present

## 2016-06-20 DIAGNOSIS — R64 Cachexia: Secondary | ICD-10-CM | POA: Diagnosis present

## 2016-06-20 DIAGNOSIS — D696 Thrombocytopenia, unspecified: Secondary | ICD-10-CM | POA: Diagnosis not present

## 2016-06-20 DIAGNOSIS — Z7901 Long term (current) use of anticoagulants: Secondary | ICD-10-CM | POA: Diagnosis not present

## 2016-06-20 DIAGNOSIS — N183 Chronic kidney disease, stage 3 (moderate): Secondary | ICD-10-CM | POA: Diagnosis present

## 2016-06-20 DIAGNOSIS — S299XXA Unspecified injury of thorax, initial encounter: Secondary | ICD-10-CM | POA: Diagnosis not present

## 2016-06-20 DIAGNOSIS — S82112A Displaced fracture of left tibial spine, initial encounter for closed fracture: Secondary | ICD-10-CM | POA: Diagnosis not present

## 2016-06-20 DIAGNOSIS — I739 Peripheral vascular disease, unspecified: Secondary | ICD-10-CM | POA: Diagnosis present

## 2016-06-20 DIAGNOSIS — M810 Age-related osteoporosis without current pathological fracture: Secondary | ICD-10-CM | POA: Diagnosis present

## 2016-06-20 DIAGNOSIS — S82192A Other fracture of upper end of left tibia, initial encounter for closed fracture: Principal | ICD-10-CM | POA: Diagnosis present

## 2016-06-20 DIAGNOSIS — K219 Gastro-esophageal reflux disease without esophagitis: Secondary | ICD-10-CM

## 2016-06-20 DIAGNOSIS — N189 Chronic kidney disease, unspecified: Secondary | ICD-10-CM | POA: Diagnosis present

## 2016-06-20 DIAGNOSIS — S82209A Unspecified fracture of shaft of unspecified tibia, initial encounter for closed fracture: Secondary | ICD-10-CM

## 2016-06-20 DIAGNOSIS — M25462 Effusion, left knee: Secondary | ICD-10-CM | POA: Diagnosis not present

## 2016-06-20 DIAGNOSIS — S82832A Other fracture of upper and lower end of left fibula, initial encounter for closed fracture: Secondary | ICD-10-CM | POA: Diagnosis not present

## 2016-06-20 DIAGNOSIS — S82302A Unspecified fracture of lower end of left tibia, initial encounter for closed fracture: Secondary | ICD-10-CM | POA: Diagnosis not present

## 2016-06-20 DIAGNOSIS — I129 Hypertensive chronic kidney disease with stage 1 through stage 4 chronic kidney disease, or unspecified chronic kidney disease: Secondary | ICD-10-CM | POA: Diagnosis present

## 2016-06-20 DIAGNOSIS — D6959 Other secondary thrombocytopenia: Secondary | ICD-10-CM | POA: Diagnosis not present

## 2016-06-20 DIAGNOSIS — H547 Unspecified visual loss: Secondary | ICD-10-CM | POA: Diagnosis present

## 2016-06-20 DIAGNOSIS — W1830XA Fall on same level, unspecified, initial encounter: Secondary | ICD-10-CM | POA: Diagnosis present

## 2016-06-20 DIAGNOSIS — Z833 Family history of diabetes mellitus: Secondary | ICD-10-CM

## 2016-06-20 DIAGNOSIS — S82162A Torus fracture of upper end of left tibia, initial encounter for closed fracture: Secondary | ICD-10-CM | POA: Diagnosis not present

## 2016-06-20 DIAGNOSIS — D62 Acute posthemorrhagic anemia: Secondary | ICD-10-CM | POA: Diagnosis not present

## 2016-06-20 DIAGNOSIS — Z825 Family history of asthma and other chronic lower respiratory diseases: Secondary | ICD-10-CM

## 2016-06-20 DIAGNOSIS — M170 Bilateral primary osteoarthritis of knee: Secondary | ICD-10-CM | POA: Diagnosis present

## 2016-06-20 DIAGNOSIS — S89092A Other physeal fracture of upper end of left tibia, initial encounter for closed fracture: Secondary | ICD-10-CM | POA: Diagnosis not present

## 2016-06-20 DIAGNOSIS — Z4789 Encounter for other orthopedic aftercare: Secondary | ICD-10-CM | POA: Diagnosis not present

## 2016-06-20 DIAGNOSIS — R296 Repeated falls: Secondary | ICD-10-CM | POA: Diagnosis present

## 2016-06-20 DIAGNOSIS — Z8551 Personal history of malignant neoplasm of bladder: Secondary | ICD-10-CM | POA: Diagnosis not present

## 2016-06-20 DIAGNOSIS — I4891 Unspecified atrial fibrillation: Secondary | ICD-10-CM | POA: Diagnosis present

## 2016-06-20 DIAGNOSIS — R531 Weakness: Secondary | ICD-10-CM | POA: Diagnosis not present

## 2016-06-20 DIAGNOSIS — Z7982 Long term (current) use of aspirin: Secondary | ICD-10-CM

## 2016-06-20 DIAGNOSIS — S82202A Unspecified fracture of shaft of left tibia, initial encounter for closed fracture: Secondary | ICD-10-CM | POA: Diagnosis not present

## 2016-06-20 DIAGNOSIS — G629 Polyneuropathy, unspecified: Secondary | ICD-10-CM | POA: Diagnosis present

## 2016-06-20 DIAGNOSIS — M6281 Muscle weakness (generalized): Secondary | ICD-10-CM | POA: Diagnosis not present

## 2016-06-20 DIAGNOSIS — Z9181 History of falling: Secondary | ICD-10-CM | POA: Diagnosis not present

## 2016-06-20 DIAGNOSIS — I429 Cardiomyopathy, unspecified: Secondary | ICD-10-CM | POA: Diagnosis present

## 2016-06-20 DIAGNOSIS — R259 Unspecified abnormal involuntary movements: Secondary | ICD-10-CM | POA: Diagnosis not present

## 2016-06-20 DIAGNOSIS — S82102A Unspecified fracture of upper end of left tibia, initial encounter for closed fracture: Secondary | ICD-10-CM | POA: Diagnosis not present

## 2016-06-20 DIAGNOSIS — N179 Acute kidney failure, unspecified: Secondary | ICD-10-CM | POA: Diagnosis not present

## 2016-06-20 DIAGNOSIS — Y92000 Kitchen of unspecified non-institutional (private) residence as  the place of occurrence of the external cause: Secondary | ICD-10-CM | POA: Diagnosis not present

## 2016-06-20 DIAGNOSIS — E785 Hyperlipidemia, unspecified: Secondary | ICD-10-CM | POA: Diagnosis not present

## 2016-06-20 DIAGNOSIS — Z681 Body mass index (BMI) 19 or less, adult: Secondary | ICD-10-CM | POA: Diagnosis not present

## 2016-06-20 DIAGNOSIS — Z9889 Other specified postprocedural states: Secondary | ICD-10-CM

## 2016-06-20 DIAGNOSIS — S82202D Unspecified fracture of shaft of left tibia, subsequent encounter for closed fracture with routine healing: Secondary | ICD-10-CM | POA: Diagnosis not present

## 2016-06-20 DIAGNOSIS — M79609 Pain in unspecified limb: Secondary | ICD-10-CM | POA: Diagnosis not present

## 2016-06-20 DIAGNOSIS — S82162D Torus fracture of upper end of left tibia, subsequent encounter for fracture with routine healing: Secondary | ICD-10-CM | POA: Diagnosis not present

## 2016-06-20 DIAGNOSIS — I481 Persistent atrial fibrillation: Secondary | ICD-10-CM | POA: Diagnosis not present

## 2016-06-20 DIAGNOSIS — Z87891 Personal history of nicotine dependence: Secondary | ICD-10-CM

## 2016-06-20 DIAGNOSIS — Z8711 Personal history of peptic ulcer disease: Secondary | ICD-10-CM

## 2016-06-20 DIAGNOSIS — D638 Anemia in other chronic diseases classified elsewhere: Secondary | ICD-10-CM | POA: Diagnosis present

## 2016-06-20 DIAGNOSIS — Z8249 Family history of ischemic heart disease and other diseases of the circulatory system: Secondary | ICD-10-CM | POA: Diagnosis not present

## 2016-06-20 DIAGNOSIS — R1312 Dysphagia, oropharyngeal phase: Secondary | ICD-10-CM | POA: Diagnosis not present

## 2016-06-20 DIAGNOSIS — J449 Chronic obstructive pulmonary disease, unspecified: Secondary | ICD-10-CM | POA: Diagnosis not present

## 2016-06-20 DIAGNOSIS — R627 Adult failure to thrive: Secondary | ICD-10-CM | POA: Diagnosis present

## 2016-06-20 DIAGNOSIS — G8918 Other acute postprocedural pain: Secondary | ICD-10-CM | POA: Diagnosis not present

## 2016-06-20 LAB — BASIC METABOLIC PANEL
Anion gap: 7 (ref 5–15)
BUN: 20 mg/dL (ref 6–20)
CHLORIDE: 104 mmol/L (ref 101–111)
CO2: 27 mmol/L (ref 22–32)
CREATININE: 1.29 mg/dL — AB (ref 0.61–1.24)
Calcium: 8.8 mg/dL — ABNORMAL LOW (ref 8.9–10.3)
GFR calc Af Amer: 55 mL/min — ABNORMAL LOW (ref >60)
GFR calc non Af Amer: 47 mL/min — ABNORMAL LOW (ref >60)
GLUCOSE: 122 mg/dL — AB (ref 65–99)
POTASSIUM: 4.6 mmol/L (ref 3.5–5.1)
SODIUM: 138 mmol/L (ref 135–145)

## 2016-06-20 LAB — CBC WITH DIFFERENTIAL/PLATELET
Basophils Absolute: 0 10*3/uL (ref 0.0–0.1)
Basophils Relative: 0 %
EOS ABS: 0 10*3/uL (ref 0.0–0.7)
EOS PCT: 0 %
HCT: 32.5 % — ABNORMAL LOW (ref 39.0–52.0)
Hemoglobin: 10.6 g/dL — ABNORMAL LOW (ref 13.0–17.0)
LYMPHS ABS: 1.5 10*3/uL (ref 0.7–4.0)
LYMPHS PCT: 16 %
MCH: 32.2 pg (ref 26.0–34.0)
MCHC: 32.6 g/dL (ref 30.0–36.0)
MCV: 98.8 fL (ref 78.0–100.0)
MONO ABS: 0.9 10*3/uL (ref 0.1–1.0)
MONOS PCT: 9 %
Neutro Abs: 7 10*3/uL (ref 1.7–7.7)
Neutrophils Relative %: 75 %
PLATELETS: 190 10*3/uL (ref 150–400)
RBC: 3.29 MIL/uL — AB (ref 4.22–5.81)
RDW: 14.9 % (ref 11.5–15.5)
WBC: 9.5 10*3/uL (ref 4.0–10.5)

## 2016-06-20 LAB — URINE MICROSCOPIC-ADD ON

## 2016-06-20 LAB — URINALYSIS, ROUTINE W REFLEX MICROSCOPIC
BILIRUBIN URINE: NEGATIVE
GLUCOSE, UA: 100 mg/dL — AB
HGB URINE DIPSTICK: NEGATIVE
Ketones, ur: NEGATIVE mg/dL
Nitrite: NEGATIVE
PROTEIN: 30 mg/dL — AB
Specific Gravity, Urine: 1.018 (ref 1.005–1.030)
pH: 8 (ref 5.0–8.0)

## 2016-06-20 LAB — BRAIN NATRIURETIC PEPTIDE: B Natriuretic Peptide: 799.9 pg/mL — ABNORMAL HIGH (ref 0.0–100.0)

## 2016-06-20 MED ORDER — VITAMIN D 1000 UNITS PO TABS
1000.0000 [IU] | ORAL_TABLET | Freq: Every day | ORAL | Status: DC
  Administered 2016-06-21 – 2016-06-25 (×4): 1000 [IU] via ORAL
  Filled 2016-06-20 (×4): qty 1

## 2016-06-20 MED ORDER — ENOXAPARIN SODIUM 60 MG/0.6ML ~~LOC~~ SOLN
1.0000 mg/kg | SUBCUTANEOUS | Status: DC
  Filled 2016-06-20: qty 0.6

## 2016-06-20 MED ORDER — CALCIUM CARBONATE ANTACID 500 MG PO CHEW
2.0000 | CHEWABLE_TABLET | Freq: Every day | ORAL | Status: DC
  Administered 2016-06-21 – 2016-06-25 (×5): 400 mg via ORAL
  Filled 2016-06-20 (×5): qty 2

## 2016-06-20 MED ORDER — ADULT MULTIVITAMIN W/MINERALS CH
1.0000 | ORAL_TABLET | Freq: Every day | ORAL | Status: DC
  Administered 2016-06-21 – 2016-06-25 (×4): 1 via ORAL
  Filled 2016-06-20 (×4): qty 1

## 2016-06-20 MED ORDER — OXYCODONE HCL 5 MG PO TABS
5.0000 mg | ORAL_TABLET | Freq: Four times a day (QID) | ORAL | Status: DC | PRN
  Administered 2016-06-21 – 2016-06-22 (×3): 5 mg via ORAL
  Filled 2016-06-20 (×4): qty 1

## 2016-06-20 MED ORDER — ACETAMINOPHEN 650 MG RE SUPP: 650.0000 mg | Freq: Four times a day (QID) | RECTAL | Status: DC | PRN

## 2016-06-20 MED ORDER — ACETAMINOPHEN 325 MG PO TABS
650.0000 mg | ORAL_TABLET | Freq: Four times a day (QID) | ORAL | Status: DC | PRN
  Administered 2016-06-21: 650 mg via ORAL
  Filled 2016-06-20 (×2): qty 2

## 2016-06-20 MED ORDER — FENTANYL CITRATE (PF) 100 MCG/2ML IJ SOLN
25.0000 ug | Freq: Once | INTRAMUSCULAR | Status: AC
  Administered 2016-06-20: 25 ug via INTRAVENOUS
  Filled 2016-06-20: qty 2

## 2016-06-20 MED ORDER — ENSURE ENLIVE PO LIQD
237.0000 mL | Freq: Every day | ORAL | Status: DC
  Administered 2016-06-21 – 2016-06-25 (×4): 237 mL via ORAL

## 2016-06-20 MED ORDER — ENOXAPARIN SODIUM 40 MG/0.4ML ~~LOC~~ SOLN
1.0000 mg/kg | SUBCUTANEOUS | Status: DC
  Administered 2016-06-21: 40 mg via SUBCUTANEOUS
  Filled 2016-06-20: qty 0.4

## 2016-06-20 MED ORDER — ONDANSETRON HCL 4 MG/2ML IJ SOLN: 4.0000 mg | Freq: Four times a day (QID) | INTRAMUSCULAR | Status: DC | PRN

## 2016-06-20 MED ORDER — ONDANSETRON HCL 4 MG PO TABS: 4.0000 mg | ORAL_TABLET | Freq: Four times a day (QID) | ORAL | Status: DC | PRN

## 2016-06-20 MED ORDER — PANTOPRAZOLE SODIUM 40 MG PO TBEC
40.0000 mg | DELAYED_RELEASE_TABLET | Freq: Two times a day (BID) | ORAL | Status: DC
  Administered 2016-06-21 – 2016-06-25 (×9): 40 mg via ORAL
  Filled 2016-06-20 (×9): qty 1

## 2016-06-20 NOTE — ED Notes (Signed)
Contacted pt's daughter, Guy Begin, with update on situation and care.

## 2016-06-20 NOTE — H&P (Signed)
History and Physical    Sean [name] [id number] DOB: 1925-06-19 DOA: 06/20/2016  Referring MD/NP/PA: Dr. Ayesha [name] PCP: Sean Cower, MD  Patient coming from: Home via EMS  Chief Complaint: Fall  HPI: Sean [name] is a 82 y.o. male with medical history significant of HTN, HLD, A.Fib on Xarelto, PVD, COPD, GERD; who presents after having a mechanical fall at home. Patient states that he was bending down to get something out of the refrigerator when he lost his footing,  heard a pop, and fell on the floor. Denies any loss of consciousness or trauma to his head. Patient reports significant pain in the left leg thereafter for which he was unable to get up or ambulate. Family notes associated symptoms of weight loss and overall generalized weakness. EMS was called at that time. At baseline patient is noted to have leg weakness due to neuropathy and peripheral vascular disease. Family notes that the patient has fallen at least 4 times in the last month. They questioned if it is safe for the patient be on blood thinners with such frequent falls.  ED Course: X-rays reveal left tibia fracture. Orthopedics Dr. Erlinda [name] consulted, but because of the patient being on Xarelto surgery was likely not going to be the following day.   Review of Systems: As per HPI otherwise 10 point review of systems negative.   Past Medical History:  Diagnosis Date  . ABUSE, ALCOHOL, IN REMISSION 06/17/2007  . Adenomatous colon polyp 01/1984  . Alcoholic cirrhosis of liver ([place]) 06/17/2007  . Aortic regurgitation 03/11/2016  . BLADDER CANCER 06/17/2007  . Cardiomyopathy ([place]) 03/11/2016  . CAROTID ARTERY DISEASE 07/23/2009  . COMPRESSION FRACTURE, LUMBAR VERTEBRAE 08/09/2009  . COPD 09/18/2008  . DIVERTICULOSIS, COLON 06/17/2007  . ECHOCARDIOGRAM, ABNORMAL 07/06/2008  . GERD 06/17/2007  . GLUCOSE INTOLERANCE 06/17/2007  . Hiatal hernia   . HYPERLIPIDEMIA 06/17/2007  . HYPERTENSION 06/17/2007  . Impaired glucose  tolerance 12/13/2010  . Internal hemorrhoids   . LOW BACK PAIN, CHRONIC 12/17/2009  . OSTEOARTHRITIS, KNEES, BILATERAL 06/19/2009  . OSTEOPOROSIS 06/17/2007  . PEPTIC ULCER DISEASE 06/17/2007  . PSA, INCREASED 09/18/2008  . RENAL INSUFFICIENCY 09/18/2008  . Tubulovillous adenoma 04/2000   Duodenal polyp  . UNSPECIFIED SUDDEN HEARING LOSS 03/20/2009  . VITAMIN D DEFICIENCY 09/18/2008    Past Surgical History:  Procedure Laterality Date  . CHOLECYSTECTOMY  04/2000  . inguinal herniorrhapy    . LUMBAR LAMINECTOMY     x 2  . s/p Bilroth II    . s/p bowel obstruction surgury  12/01  . s/p left middle ear surgury  late 80's   Dr. Juanell [name]     reports that he quit smoking about 11 years ago. He has never used smokeless tobacco. He reports that he does not drink alcohol or use drugs.  Allergies  Allergen Reactions  . Amoxicillin-Pot Clavulanate     REACTION: rash  . Ibuprofen     REACTION: itching    Family History  Problem Relation Age of Onset  . Diabetes Other     5 siblings  . Heart attack Mother   . Emphysema Father   . Breast cancer Sister     Prior to Admission medications   Medication Sig Start Date End Date Taking? Authorizing Provider  calcium carbonate (TUMS - DOSED IN MG ELEMENTAL CALCIUM) 500 MG chewable tablet Chew 2 tablets by mouth daily.    Historical Provider, MD  Multiple Vitamin (MULTIVITAMINS PO) Take by mouth.  Historical Provider, MD  omeprazole (PRILOSEC) 20 MG capsule Take 1 capsule (20 mg total) by mouth 2 (two) times daily before a meal. 10/01/15   Biagio Borg, MD  oxyCODONE (ROXICODONE) 5 MG immediate release tablet Take 1 tablet (5 mg total) by mouth every 6 (six) hours as needed. 02/27/16   Biagio Borg, MD  pravastatin (PRAVACHOL) 40 MG tablet Take 40 mg by mouth daily.      Historical Provider, MD  ranitidine (ZANTAC) 300 MG capsule Take 1 capsule (300 mg total) by mouth 2 (two) times daily. 07/29/13   Ladene Artist, MD  Rivaroxaban (XARELTO)  15 MG TABS tablet Take 1 tablet (15 mg total) by mouth daily with supper. 03/17/16   Will Meredith Leeds, MD  Rivaroxaban (XARELTO) 15 MG TABS tablet Take 1 tablet (15 mg total) by mouth daily with supper. 03/17/16   Will Meredith Leeds, MD    Physical Exam: Constitutional: Frail and cachectic elderly male who is in moderate discomfort Vitals:   06/20/16 1910 06/20/16 2107 06/20/16 2115  BP: 150/65 149/59 (!) 154/53  Pulse: (!) 52 76 62  Resp: 18 18 20   Temp: 98.1 F (36.7 C)    TempSrc: Oral    SpO2: 97% 95% 100%   Eyes: PERRL, lids and conjunctivae normal ENMT: Mucous membranes are moist. Patient hard of hearing. Posterior pharynx clear of any exudate or lesions. Neck: normal, supple, no masses, no thyromegaly Respiratory: . Chest with decreased overall air movement. Normal respiratory effort. No accessory muscle use.  Cardiovascular: Regular rate and rhythm, no murmurs / rubs / gallops. No extremity edema. 2+ pedal pulses. No carotid bruits.  Abdomen: no tenderness, no masses palpated. No hepatosplenomegaly. Bowel sounds positive.  Musculoskeletal: no clubbing / cyanosis. Left leg in brace and foot slightly externally rotated. Skin: no rashes, lesions, ulcers. No induration Neurologic: CN 2-12 grossly intact. Sensation intact, DTR normal. Strength 4+/5 in all 4.  Psychiatric: Normal judgment and insight. Alert and oriented x 3. Normal mood.     Labs on Admission: I have personally reviewed following labs and imaging studies  CBC:  Recent Labs Lab 06/20/16 2019  WBC 9.5  NEUTROABS 7.0  HGB 10.6*  HCT 32.5*  MCV 98.8  PLT 99991111   Basic Metabolic Panel:  Recent Labs Lab 06/20/16 2019  NA 138  K 4.6  CL 104  CO2 27  GLUCOSE 122*  BUN 20  CREATININE 1.29*  CALCIUM 8.8*   GFR: Estimated Creatinine Clearance: 26.1 mL/min (by C-G formula based on SCr of 1.29 mg/dL (H)). Liver Function Tests: No results for input(s): AST, ALT, ALKPHOS, BILITOT, PROT, ALBUMIN in the  last 168 hours. No results for input(s): LIPASE, AMYLASE in the last 168 hours. No results for input(s): AMMONIA in the last 168 hours. Coagulation Profile: No results for input(s): INR, PROTIME in the last 168 hours. Cardiac Enzymes: No results for input(s): CKTOTAL, CKMB, CKMBINDEX, TROPONINI in the last 168 hours. BNP (last 3 results) No results for input(s): PROBNP in the last 8760 hours. HbA1C: No results for input(s): HGBA1C in the last 72 hours. CBG: No results for input(s): GLUCAP in the last 168 hours. Lipid Profile: No results for input(s): CHOL, HDL, LDLCALC, TRIG, CHOLHDL, LDLDIRECT in the last 72 hours. Thyroid Function Tests: No results for input(s): TSH, T4TOTAL, FREET4, T3FREE, THYROIDAB in the last 72 hours. Anemia Panel: No results for input(s): VITAMINB12, FOLATE, FERRITIN, TIBC, IRON, RETICCTPCT in the last 72 hours. Urine analysis:  Component Value Date/Time   COLORURINE AMBER (A) 06/20/2016 2023   APPEARANCEUR CLEAR 06/20/2016 2023   LABSPEC 1.018 06/20/2016 2023   PHURINE 8.0 06/20/2016 2023   GLUCOSEU 100 (A) 06/20/2016 2023   GLUCOSEU NEGATIVE 02/27/2016 1538   HGBUR NEGATIVE 06/20/2016 2023   BILIRUBINUR NEGATIVE 06/20/2016 2023   KETONESUR NEGATIVE 06/20/2016 2023   PROTEINUR 30 (A) 06/20/2016 2023   UROBILINOGEN 1.0 02/27/2016 1538   NITRITE NEGATIVE 06/20/2016 2023   LEUKOCYTESUR TRACE (A) 06/20/2016 2023   Sepsis Labs: No results found for this or any previous visit (from the past 240 hour(s)).   Radiological Exams on Admission: Dg Chest 1 View  Result Date: 06/20/2016 CLINICAL DATA:  82 year old male status post fall today. Initial encounter. EXAM: CHEST 1 VIEW COMPARISON:  02/27/2016, [place] 11/15/2007. FINDINGS: Right upper extremity artifact projecting over the diaphragm. Cardiac silhouette has increased on this PA view compared to August. Other mediastinal contours are within normal limits. Chronic hyperinflation. No pneumothorax,  pulmonary edema, pleural effusion or confluent pulmonary opacity. EKG button artifact projecting in the right upper lung. Osteopenia. Previously augmented upper lumbar compression fracture. No acute osseous abnormality identified. IMPRESSION: 1. Cardiac silhouette appears mildly increased since August. Consider pericardial effusion. 2. No other acute cardiopulmonary abnormality. Chronic hyperinflation. 3. Right upper extremity artifact projecting over the diaphragm. Electronically Signed   By: Genevie Ann M.D.   On: 06/20/2016 21:10   Dg Knee 2 Views Left  Result Date: 06/20/2016 CLINICAL DATA:  Patient fell today. Swelling in the left knee. Lower leg feels numb. Not able to straighten knee. Severe pain. EXAM: LEFT KNEE - 1-2 VIEW COMPARISON:  None. FINDINGS: Diffuse bone demineralization. Postoperative changes with hardware fixation in the femoral metaphysis, incompletely included within the field of view. Oblique probably comminuted fractures of the left proximal tibial meta diaphysis with about 7 mm lateral displacement of the distal fracture fragment. Alignment appears intact. Slight cortical irregularity at the junction of the proximal fibular head neck is incompletely visualized but could also represent fibular fracture. Soft tissue swelling. Prominent degenerative changes in the left knee with chondrocalcinosis and small osteochondral defect in the left medial femoral condyle. No significant effusion. Vascular calcifications. IMPRESSION: Diffuse bone demineralization with acute mildly displaced fracture of the proximal left tibia at the junction of the metaphysis and shaft. Probable nondisplaced fracture of the proximal left fibula. Electronically Signed   By: Lucienne Capers M.D.   On: 06/20/2016 21:10   Dg Tibia/fibula Left  Result Date: 06/20/2016 CLINICAL DATA:  [place] today with swelling in the left knee. Pain and limited range of motion. EXAM: LEFT TIBIA AND FIBULA - 2 VIEW COMPARISON:  None.  FINDINGS: Diffuse bone demineralization. Acute oblique fracture of the proximal left tibia at the junction of the metaphysis and shaft. There is about 7 mm lateral displacement of the distal fracture fragment. Alignment is intact. Midshaft and distal tibia appear intact. Limited visualization of the proximal fibula. Possible fracture of the proximal fibula is not well evaluated on this study. See additional report of the left knee. Degenerative changes in the left knee and left ankle. Vascular calcifications throughout. IMPRESSION: Oblique fracture of the proximal left tibia with mild lateral displacement of the distal fracture fragment. Electronically Signed   By: Lucienne Capers M.D.   On: 06/20/2016 21:13    EKG: Independently reviewed. Atrial fibrillation 74 bpm  Assessment/Plan Closed tibia fracture 2/2 Fall - Admit to MedSurg bed - Continue knee immobilizer - Oxycodone/Fentanyl IV prn moderate to  severe pain respectively - Appreciate orthopedic consultative services, follow-up with Dr. Sherrian Divers   Atrial fibrillation on chronic anticoagulant therapy: Chronic. Patient currently rate controlled. \CHA2DS2-VASc Score = 3. Due to patient's low score question need of bridging. Patient sees Dr. Tomasa Blase of cardiology. - held Xarelto - Lovenox per pharmacy until scheduled surgery  - may want to notify Dr. Tomasa Blase of pending surgery    Cardiomyopathy: Last echocardiogram 03/10/2016 EF 50- 55% with no significant wall motion abnormalities. Chest x-ray showed enlarged cardiac silhouette. Previous thyroid studies from 02/27/2016 within normal limits. - Strict I&O's - Check BNP - May need to diurese patient prior to procedure  COPD - DuoNeb's prn SOB/Wheezing  Chronic kidney disease stage III: Creatinine near patient's baseline.  - Continue to monitor  Anemia chronic disease: Patient hemoglobin 10.6 on admission. Patient's baseline usually between 11-12. - Check CBC in a.m.  [place]  substitution of  ProtonixFor omeprazole   DVT prophylaxis:  lovenox Code Status: Full  Family Communication: Discussed overall care with the patient and family present at bedside Disposition Plan: TBD Consults called: Dr. Erlinda [name] Admission status: Inpatient MedSurg  Norval Morton MD [facility] Pager [phone]  If 7PM-7AM, please contact night-coverage [email or web address] Password [email or web address]  06/20/2016, 10:14 PM

## 2016-06-20 NOTE — ED Notes (Signed)
ED Provider at bedside. 

## 2016-06-20 NOTE — ED Notes (Signed)
Attempted to call report

## 2016-06-20 NOTE — ED Provider Notes (Signed)
[facility] DEPT Provider Note   CSN: [id number] Arrival date & time: 06/20/16  1905     History   Chief Complaint Chief Complaint  Patient presents with  . Leg Injury    HPI Sean [name] is a 82 y.o. male.  The history is provided by the patient. No language interpreter was used.   Sean [name] is a 82 y.o. male who presents to the Emergency Department complaining of fall.  He was at home in the kitchen this evening when his legs buckled and he fell to the ground, landing on his left knee. He was able to get back up off the ground and 911 was called. He reports pain to his left knee and leg. He denies any head injury, loss of consciousness, chest pain, abdominal pain, hip pain. Symptoms are moderate to severe, constant. He takes Xarelto for irregular heartbeat. Past Medical History:  Diagnosis Date  . ABUSE, ALCOHOL, IN REMISSION 06/17/2007  . Adenomatous colon polyp 01/1984  . Alcoholic cirrhosis of liver ([place]) 06/17/2007  . Aortic regurgitation 03/11/2016  . BLADDER CANCER 06/17/2007  . Cardiomyopathy ([place]) 03/11/2016  . CAROTID ARTERY DISEASE 07/23/2009  . COMPRESSION FRACTURE, LUMBAR VERTEBRAE 08/09/2009  . COPD 09/18/2008  . DIVERTICULOSIS, COLON 06/17/2007  . ECHOCARDIOGRAM, ABNORMAL 07/06/2008  . GERD 06/17/2007  . GLUCOSE INTOLERANCE 06/17/2007  . Hiatal hernia   . HYPERLIPIDEMIA 06/17/2007  . HYPERTENSION 06/17/2007  . Impaired glucose tolerance 12/13/2010  . Internal hemorrhoids   . LOW BACK PAIN, CHRONIC 12/17/2009  . OSTEOARTHRITIS, KNEES, BILATERAL 06/19/2009  . OSTEOPOROSIS 06/17/2007  . PEPTIC ULCER DISEASE 06/17/2007  . PSA, INCREASED 09/18/2008  . RENAL INSUFFICIENCY 09/18/2008  . Tubulovillous adenoma 04/2000   Duodenal polyp  . UNSPECIFIED SUDDEN HEARING LOSS 03/20/2009  . VITAMIN D DEFICIENCY 09/18/2008    Patient Active Problem List   Diagnosis Date Noted  . Closed tibia fracture 06/20/2016  . Closed tibial fracture 06/20/2016  .  Cardiomyopathy ([place]) 03/11/2016  . Aortic regurgitation 03/11/2016  . Atrial fibrillation, new onset ([place]) 02/27/2016  . Chest pain 08/09/2014  . Back pain 02/01/2013  . Grief reaction 02/01/2013  . Chronic pain 01/30/2012  . Bladder neck obstruction 12/25/2011  . Weight loss 10/10/2011  . Ecchymosis 06/18/2011  . Peripheral neuropathy ([place]) 12/16/2010  . Impaired glucose tolerance 12/13/2010  . Preventative health care 12/13/2010  . LOW BACK PAIN, CHRONIC 12/17/2009  . CONSTIPATION 08/09/2009  . COMPRESSION FRACTURE, LUMBAR VERTEBRAE 08/09/2009  . CAROTID ARTERY DISEASE 07/23/2009  . OSTEOARTHRITIS, KNEES, BILATERAL 06/19/2009  . UNSPECIFIED SUDDEN HEARING LOSS 03/20/2009  . VITAMIN D DEFICIENCY 09/18/2008  . COPD (chronic obstructive pulmonary disease) ([place]) 09/18/2008  . CKD (chronic kidney disease) 09/18/2008  . PSA, INCREASED 09/18/2008  . ECHOCARDIOGRAM, ABNORMAL 07/06/2008  . BRADYCARDIA 12/17/2007  . Fatigue 06/18/2007  . BLADDER CANCER 06/17/2007  . Hyperlipemia 06/17/2007  . ABUSE, ALCOHOL, IN REMISSION 06/17/2007  . Essential hypertension 06/17/2007  . GERD 06/17/2007  . PEPTIC ULCER DISEASE 06/17/2007  . DIVERTICULOSIS, COLON 06/17/2007  . Alcoholic cirrhosis of liver ([place]) 06/17/2007  . OSTEOPOROSIS 06/17/2007  . COLONIC POLYPS, HX OF 06/17/2007    Past Surgical History:  Procedure Laterality Date  . CHOLECYSTECTOMY  04/2000  . inguinal herniorrhapy    . LUMBAR LAMINECTOMY     x 2  . s/p Bilroth II    . s/p bowel obstruction surgury  12/01  . s/p left middle ear surgury  late 80's   Dr. Juanell Fairly  Home Medications    Prior to Admission medications   Medication Sig Start Date End Date Taking? Authorizing Provider  calcium carbonate (TUMS - DOSED IN MG ELEMENTAL CALCIUM) 500 MG chewable tablet Chew 2 tablets by mouth daily.   Yes Historical Provider, MD  cholecalciferol (VITAMIN D) 1000 units tablet Take 1,000 Units by mouth daily.   Yes  Historical Provider, MD  feeding supplement, ENSURE ENLIVE, (ENSURE ENLIVE) LIQD Take 237 mLs by mouth daily.   Yes Historical Provider, MD  Multiple Vitamin (MULTIVITAMINS PO) Take 1 tablet by mouth daily.    Yes Historical Provider, MD  omeprazole (PRILOSEC) 20 MG capsule Take 1 capsule (20 mg total) by mouth 2 (two) times daily before a meal. 10/01/15  Yes Biagio Borg, MD  oxyCODONE (ROXICODONE) 5 MG immediate release tablet Take 1 tablet (5 mg total) by mouth every 6 (six) hours as needed. Patient taking differently: Take 5 mg by mouth every 6 (six) hours as needed for moderate pain.  02/27/16  Yes Biagio Borg, MD  Rivaroxaban (XARELTO) 15 MG TABS tablet Take 1 tablet (15 mg total) by mouth daily with supper. 03/17/16  Yes Will Meredith Leeds, MD    Family History Family History  Problem Relation Age of Onset  . Diabetes Other     5 siblings  . Heart attack Mother   . Emphysema Father   . Breast cancer Sister     Social History Social History  Substance Use Topics  . Smoking status: Former Smoker    Quit date: 10/09/2004  . Smokeless tobacco: Never Used  . Alcohol use No     Allergies   Amoxicillin-pot clavulanate and Ibuprofen   Review of Systems Review of Systems  All other systems reviewed and are negative.    Physical Exam Updated Vital Signs BP (!) 142/49 (BP Location: Left Arm)   Pulse 94   Temp 98 F (36.7 C) (Oral)   Resp 13   Ht 5\' 10"  (1.778 m)   Wt 92 lb 11.2 oz (42 kg)   SpO2 100%   BMI 13.30 kg/m   Physical Exam  Constitutional: He is oriented to person, place, and time. He appears well-developed.  Frail and cachectic  HENT:  Head: Normocephalic and atraumatic.  Cardiovascular: Normal rate and regular rhythm.   No murmur heard. Pulmonary/Chest: Effort normal and breath sounds normal. No respiratory distress.  Healing ecchymosis to right upper chest wall without any tenderness  Abdominal: Soft. There is no tenderness. There is no rebound and no  guarding.  Musculoskeletal:  Faint dopplerable DP and PT pulses bilaterally. There is swelling, tenderness and deformity to the left knee and proximal tib-fib. Wiggles toes bilaterally.  Neurological: He is alert and oriented to person, place, and time.  Hard of hearing  Skin: Skin is warm and dry.  Psychiatric: He has a normal mood and affect. His behavior is normal.  Nursing note and vitals reviewed.    ED Treatments / Results  Labs (all labs ordered are listed, but only abnormal results are displayed) Labs Reviewed  BASIC METABOLIC PANEL - Abnormal; Notable for the following:       Result Value   Glucose, Bld 122 (*)    Creatinine, Ser 1.29 (*)    Calcium 8.8 (*)    GFR calc non Af Amer 47 (*)    GFR calc Af Amer 55 (*)    All other components within normal limits  CBC WITH DIFFERENTIAL/PLATELET - Abnormal; Notable for  the following:    RBC 3.29 (*)    Hemoglobin 10.6 (*)    HCT 32.5 (*)    All other components within normal limits  URINALYSIS, ROUTINE W REFLEX MICROSCOPIC (NOT AT [facility]) - Abnormal; Notable for the following:    Color, Urine AMBER (*)    Glucose, UA 100 (*)    Protein, ur 30 (*)    Leukocytes, UA TRACE (*)    All other components within normal limits  URINE MICROSCOPIC-ADD ON - Abnormal; Notable for the following:    Squamous Epithelial / LPF 0-5 (*)    Bacteria, UA RARE (*)    Casts HYALINE CASTS (*)    All other components within normal limits  BRAIN NATRIURETIC PEPTIDE - Abnormal; Notable for the following:    B Natriuretic Peptide 799.9 (*)    All other components within normal limits  COMPREHENSIVE METABOLIC PANEL  CBC    EKG  EKG Interpretation  Date/Time:  Friday June 20 2016 21:31:24 EST Ventricular Rate:  74 PR Interval:    QRS Duration: 93 QT Interval:  457 QTC Calculation: 508 R Axis:   66 Text Interpretation:  Atrial fibrillation Anteroseptal infarct, age indeterminate Prolonged QT interval Artifact Confirmed by Hazle Coca  [phone]) on 06/20/2016 9:33:41 PM       Radiology Dg Chest 1 View  Result Date: 06/20/2016 CLINICAL DATA:  82 year old male status post fall today. Initial encounter. EXAM: CHEST 1 VIEW COMPARISON:  02/27/2016, levin 11/15/2007. FINDINGS: Right upper extremity artifact projecting over the diaphragm. Cardiac silhouette has increased on this PA view compared to August. Other mediastinal contours are within normal limits. Chronic hyperinflation. No pneumothorax, pulmonary edema, pleural effusion or confluent pulmonary opacity. EKG button artifact projecting in the right upper lung. Osteopenia. Previously augmented upper lumbar compression fracture. No acute osseous abnormality identified. IMPRESSION: 1. Cardiac silhouette appears mildly increased since August. Consider pericardial effusion. 2. No other acute cardiopulmonary abnormality. Chronic hyperinflation. 3. Right upper extremity artifact projecting over the diaphragm. Electronically Signed   By: Genevie Ann M.D.   On: 06/20/2016 21:10   Dg Knee 2 Views Left  Result Date: 06/20/2016 CLINICAL DATA:  Patient fell today. Swelling in the left knee. Lower leg feels numb. Not able to straighten knee. Severe pain. EXAM: LEFT KNEE - 1-2 VIEW COMPARISON:  None. FINDINGS: Diffuse bone demineralization. Postoperative changes with hardware fixation in the femoral metaphysis, incompletely included within the field of view. Oblique probably comminuted fractures of the left proximal tibial meta diaphysis with about 7 mm lateral displacement of the distal fracture fragment. Alignment appears intact. Slight cortical irregularity at the junction of the proximal fibular head neck is incompletely visualized but could also represent fibular fracture. Soft tissue swelling. Prominent degenerative changes in the left knee with chondrocalcinosis and small osteochondral defect in the left medial femoral condyle. No significant effusion. Vascular calcifications. IMPRESSION: Diffuse  bone demineralization with acute mildly displaced fracture of the proximal left tibia at the junction of the metaphysis and shaft. Probable nondisplaced fracture of the proximal left fibula. Electronically Signed   By: Lucienne Capers M.D.   On: 06/20/2016 21:10   Dg Tibia/fibula Left  Result Date: 06/20/2016 CLINICAL DATA:  [place] today with swelling in the left knee. Pain and limited range of motion. EXAM: LEFT TIBIA AND FIBULA - 2 VIEW COMPARISON:  None. FINDINGS: Diffuse bone demineralization. Acute oblique fracture of the proximal left tibia at the junction of the metaphysis and shaft. There is about 7 mm lateral  displacement of the distal fracture fragment. Alignment is intact. Midshaft and distal tibia appear intact. Limited visualization of the proximal fibula. Possible fracture of the proximal fibula is not well evaluated on this study. See additional report of the left knee. Degenerative changes in the left knee and left ankle. Vascular calcifications throughout. IMPRESSION: Oblique fracture of the proximal left tibia with mild lateral displacement of the distal fracture fragment. Electronically Signed   By: Lucienne Capers M.D.   On: 06/20/2016 21:13    Procedures Procedures (including critical care time)  Medications Ordered in ED Medications  feeding supplement (ENSURE ENLIVE) (ENSURE ENLIVE) liquid 237 mL (not administered)  cholecalciferol (VITAMIN D) tablet 1,000 Units (not administered)  oxyCODONE (Oxy IR/ROXICODONE) immediate release tablet 5 mg (5 mg Oral Given 06/21/16 0005)  pantoprazole (PROTONIX) EC tablet 40 mg (40 mg Oral Given 06/21/16 0016)  multivitamin with minerals tablet 1 tablet (not administered)  calcium carbonate (TUMS - dosed in mg elemental calcium) chewable tablet 400 mg of elemental calcium (not administered)  ondansetron (ZOFRAN) tablet 4 mg (not administered)    Or  ondansetron (ZOFRAN) injection 4 mg (not administered)  acetaminophen (TYLENOL) tablet  650 mg (not administered)    Or  acetaminophen (TYLENOL) suppository 650 mg (not administered)  enoxaparin (LOVENOX) injection 40 mg (40 mg Subcutaneous Given 06/21/16 0016)  fentaNYL (SUBLIMAZE) injection 25 mcg (not administered)  fentaNYL (SUBLIMAZE) injection 25 mcg (25 mcg Intravenous Given 06/20/16 2118)     Initial Impression / Assessment and Plan / ED Course  I have reviewed the triage vital signs and the nursing notes.  Pertinent labs & imaging results that were available during my care of the patient were reviewed by me and considered in my medical decision making (see chart for details).  Clinical Course     Patient here for evaluation of injuries following a fall. No history or evidence of head injury. He has a deformity to the left knee and leg, x-rays demonstrated a tib-fib fracture. He has absent DP pulses bilaterally but there are dopplerable pulses bilaterally. Patient has previously been treated by the [facility]. Discussed with Dr. Percell Miller who is on-call for the group and he recommends contacting the on-call orthopedist for tonight. Discussed with Dr. Erlinda Hong with [facility] orthopedics who will see the patient in consult. Hospitalist consulted for admission given multiple medical comorbidities.  Final Clinical Impressions(s) / ED Diagnoses   Final diagnoses:  None    New Prescriptions Current Discharge Medication List       Quintella Reichert, MD 06/21/16 [phone]

## 2016-06-20 NOTE — Progress Notes (Signed)
Orthopedic Tech Progress Note Patient Details:  Sean [name] January 15, 1925 [id number]  Ortho Devices Type of Ortho Device: Knee Immobilizer Ortho Device/Splint Location: lle Ortho Device/Splint Interventions: Ordered, Application   Karolee Stamps 06/20/2016, 10:58 PM

## 2016-06-20 NOTE — Progress Notes (Signed)
ANTICOAGULATION CONSULT NOTE - Initial Consult  Pharmacy Consult for Lovenox Indication: atrial fibrillation  Allergies  Allergen Reactions  . Amoxicillin-Pot Clavulanate     REACTION: rash  . Ibuprofen     REACTION: itching    Patient Measurements: 47 kg  Vital Signs: Temp: 98.1 F (36.7 C) (11/24 1910) Temp Source: Oral (11/24 1910) BP: 152/63 (11/24 2215) Pulse Rate: 55 (11/24 2215)  Labs:  Recent Labs  06/20/16 2019  HGB 10.6*  HCT 32.5*  PLT 190  CREATININE 1.29*    Estimated Creatinine Clearance: 26.1 mL/min (by C-G formula based on SCr of 1.29 mg/dL (H)).   Medical History: Past Medical History:  Diagnosis Date  . ABUSE, ALCOHOL, IN REMISSION 06/17/2007  . Adenomatous colon polyp 01/1984  . Alcoholic cirrhosis of liver ([place]) 06/17/2007  . Aortic regurgitation 03/11/2016  . BLADDER CANCER 06/17/2007  . Cardiomyopathy ([place]) 03/11/2016  . CAROTID ARTERY DISEASE 07/23/2009  . COMPRESSION FRACTURE, LUMBAR VERTEBRAE 08/09/2009  . COPD 09/18/2008  . DIVERTICULOSIS, COLON 06/17/2007  . ECHOCARDIOGRAM, ABNORMAL 07/06/2008  . GERD 06/17/2007  . GLUCOSE INTOLERANCE 06/17/2007  . Hiatal hernia   . HYPERLIPIDEMIA 06/17/2007  . HYPERTENSION 06/17/2007  . Impaired glucose tolerance 12/13/2010  . Internal hemorrhoids   . LOW BACK PAIN, CHRONIC 12/17/2009  . OSTEOARTHRITIS, KNEES, BILATERAL 06/19/2009  . OSTEOPOROSIS 06/17/2007  . PEPTIC ULCER DISEASE 06/17/2007  . PSA, INCREASED 09/18/2008  . RENAL INSUFFICIENCY 09/18/2008  . Tubulovillous adenoma 04/2000   Duodenal polyp  . UNSPECIFIED SUDDEN HEARING LOSS 03/20/2009  . VITAMIN D DEFICIENCY 09/18/2008     Assessment: 82 y/o M here with fall, on Xarelto PTA for afib, switching to Lovenox for now, last dose Xarelto >24 hours ago, can start Lovenox now. Hgb 10.6, CrCl <30 will qualify for q24h Lovenox.   Goal of Therapy:  Monitor platelets by anticoagulation protocol: Yes   Plan:  -Lovenox 1 mg/kg subcutaneous  q24h -Minimum q72h CBC while inpatient -Monitor for bleeding  Narda Bonds 06/20/2016,11:03 PM

## 2016-06-20 NOTE — ED Triage Notes (Signed)
Pt presents to the ed with ems after having a fall from the standing position with complaints of left sided leg pain, denies any loc, and alert and oriented.

## 2016-06-21 DIAGNOSIS — S82162D Torus fracture of upper end of left tibia, subsequent encounter for fracture with routine healing: Secondary | ICD-10-CM

## 2016-06-21 DIAGNOSIS — Z7901 Long term (current) use of anticoagulants: Secondary | ICD-10-CM

## 2016-06-21 DIAGNOSIS — D638 Anemia in other chronic diseases classified elsewhere: Secondary | ICD-10-CM | POA: Diagnosis present

## 2016-06-21 LAB — COMPREHENSIVE METABOLIC PANEL
ALT: 18 U/L (ref 17–63)
AST: 36 U/L (ref 15–41)
Albumin: 2.8 g/dL — ABNORMAL LOW (ref 3.5–5.0)
Alkaline Phosphatase: 119 U/L (ref 38–126)
Anion gap: 8 (ref 5–15)
BUN: 21 mg/dL — AB (ref 6–20)
CHLORIDE: 103 mmol/L (ref 101–111)
CO2: 28 mmol/L (ref 22–32)
CREATININE: 1.25 mg/dL — AB (ref 0.61–1.24)
Calcium: 8.6 mg/dL — ABNORMAL LOW (ref 8.9–10.3)
GFR calc Af Amer: 57 mL/min — ABNORMAL LOW (ref >60)
GFR, EST NON AFRICAN AMERICAN: 49 mL/min — AB (ref >60)
Glucose, Bld: 100 mg/dL — ABNORMAL HIGH (ref 65–99)
Potassium: 4.3 mmol/L (ref 3.5–5.1)
Sodium: 139 mmol/L (ref 135–145)
Total Bilirubin: 0.6 mg/dL (ref 0.3–1.2)
Total Protein: 5.5 g/dL — ABNORMAL LOW (ref 6.5–8.1)

## 2016-06-21 LAB — CBC
HCT: 28.3 % — ABNORMAL LOW (ref 39.0–52.0)
Hemoglobin: 9.2 g/dL — ABNORMAL LOW (ref 13.0–17.0)
MCH: 32.2 pg (ref 26.0–34.0)
MCHC: 32.5 g/dL (ref 30.0–36.0)
MCV: 99 fL (ref 78.0–100.0)
PLATELETS: 164 10*3/uL (ref 150–400)
RBC: 2.86 MIL/uL — ABNORMAL LOW (ref 4.22–5.81)
RDW: 15 % (ref 11.5–15.5)
WBC: 7.8 10*3/uL (ref 4.0–10.5)

## 2016-06-21 MED ORDER — POVIDONE-IODINE 10 % EX SWAB: 2.0000 "application " | Freq: Once | CUTANEOUS | Status: DC

## 2016-06-21 MED ORDER — CEFAZOLIN SODIUM-DEXTROSE 2-4 GM/100ML-% IV SOLN
2.0000 g | INTRAVENOUS | Status: DC
  Filled 2016-06-21: qty 100

## 2016-06-21 MED ORDER — FENTANYL CITRATE (PF) 100 MCG/2ML IJ SOLN
25.0000 ug | INTRAMUSCULAR | Status: DC | PRN
  Administered 2016-06-21 – 2016-06-24 (×4): 25 ug via INTRAVENOUS
  Filled 2016-06-21 (×4): qty 2

## 2016-06-21 MED ORDER — MELATONIN 3 MG PO TABS
3.0000 mg | ORAL_TABLET | Freq: Every day | ORAL | Status: DC
  Administered 2016-06-21 – 2016-06-24 (×5): 3 mg via ORAL
  Filled 2016-06-21 (×6): qty 1

## 2016-06-21 MED ORDER — IPRATROPIUM-ALBUTEROL 0.5-2.5 (3) MG/3ML IN SOLN: 3.0000 mL | RESPIRATORY_TRACT | Status: DC | PRN

## 2016-06-21 NOTE — Progress Notes (Signed)
PROGRESS NOTE  Sean [name]  [id number] DOB: 1925/07/17  DOA: 06/20/2016 PCP: Cathlean Cower, MD   Brief Narrative:  82 year old male, married, lives with spouse, PMH of HTN, HLD, A. fib on Xarelto, PVD, COPD, GERD, alcoholic cirrhosis, visual impairment, hard of hearing, severe peripheral neuropathy, presented to [facility] following a mechanical fall at home and sustained left tibial fracture. He stated that he was bending down to get something out of the refrigerator when he lost his footing and sat to the floor and heard a pop. Family gives history of at least 3-4 falls within the last month. He continues to drive. Orthopedics have seen and plan for surgery on 11/26.   Assessment & Plan:   Principal Problem:   Closed tibia fracture Active Problems:   COPD (chronic obstructive pulmonary disease) (HCC)   GERD   CKD (chronic kidney disease)   Atrial fibrillation (HCC)   Cardiomyopathy (HCC)   Anemia of chronic disease   Chronic anticoagulation   1. Left proximal tibial fracture: Sustained status post mechanical fall. Orthopedics consultation appreciated and plan for surgical fixation on 11/26. Based upon available data, patient is at least at moderate risk for perioperative CV events based on advanced age, frail physical health, multiple significant comorbidities which seem optimized. May proceed with indicated surgery with close perioperative monitoring. Discussed with Dr. Erlinda Hong. 2. A. fib: Recently saw his cardiologist on 06/18/16. CHA2DS2-VASc Score : 3. Last dose of Xarelto was on 06/19/16. Continue to hold for surgery tomorrow. Resume postop when okay with orthopedics. Patient is not on rate control medications at home. 3. Essential hypertension: Controlled. 4. Frequent falls: Apparently has sustained 3-4 falls in the last month. Etiology is multifactorial secondary to advanced age, frail physical status, significant peripheral neuropathy, visual impairment, hard of hearing and multiple  comorbidities. Since patient will be going to SNF postop, reasonable to continue Xarelto in closely monitored setting. However if he were to return home, decision regarding continued long-term anticoagulation will need to be reassessed. Patient was counseled that it is not safe for him to drive anymore. 5. COPD: Stable without clinical bronchospasm. Monitor closely. 6. Anemia: Follow CBCs postop. 7. Stage III chronic kidney disease: Creatinine at baseline 8. GERD: Continue PPI 9. Cardiomyopathy: Last echo 03/10/16 showed EF 50-55 percent. Clinically euvolemic. 10. Adult failure to thrive: Multifactorial.   DVT prophylaxis: SCDs Code Status: Full Family Communication: Discussed in detail with patient's spouse and daughter-in-law at bedside. Disposition Plan: DC to SNF when medically stable   Consultants:   Orthopedics  Procedures:   None  Antimicrobials:   None    Subjective: Mild pain at fracture site. At baseline, patient ambulates small distances within the house without dyspnea or chest pain.  Objective:  Vitals:   06/20/16 2215 06/20/16 2318 06/21/16 0514 06/21/16 1500  BP: 152/63 (!) 142/49 (!) 123/45 (!) 125/43  Pulse: (!) 55 94 65 60  Resp: 13  16 16   Temp:  98 F (36.7 C) 97.9 F (36.6 C) 98.4 F (36.9 C)  TempSrc:  Oral Oral Oral  SpO2: 99% 100% 100% 100%  Weight:  42 kg (92 lb 11.2 oz)    Height:  5\' 10"  (1.778 m)      Intake/Output Summary (Last 24 hours) at 06/21/16 1516 Last data filed at 06/21/16 1500  Gross per 24 hour  Intake              480 ml  Output  200 ml  Net              280 ml   Filed Weights   06/20/16 2318  Weight: 42 kg (92 lb 11.2 oz)    Examination:  General exam: Pleasant elderly male, moderately built, extremely frail, lying comfortably propped up in bed without distress. Respiratory system: Clear to auscultation. Respiratory effort normal. Cardiovascular system: S1 & S2 heard, RRR. No JVD, murmurs, rubs,  gallops or clicks. No pedal edema. Gastrointestinal system: Abdomen is nondistended, soft and nontender. No organomegaly or masses felt. Normal bowel sounds heard. Central nervous system: Alert and oriented. No focal neurological deficits. Hard of hearing. Extremities: Symmetric 5 x 5 power. Left lower extremity movements restricted secondary to pain. Skin: No rashes, lesions or ulcers Psychiatry: Judgement and insight appear impaired. Mood & affect appropriate.     Data Reviewed: I have personally reviewed following labs and imaging studies  CBC:  Recent Labs Lab 06/20/16 2019 06/21/16 0404  WBC 9.5 7.8  NEUTROABS 7.0  --   HGB 10.6* 9.2*  HCT 32.5* 28.3*  MCV 98.8 99.0  PLT 190 [id number]   Basic Metabolic Panel:  Recent Labs Lab 06/20/16 2019 06/21/16 0404  NA 138 139  K 4.6 4.3  CL 104 103  CO2 27 28  GLUCOSE 122* 100*  BUN 20 21*  CREATININE 1.29* 1.25*  CALCIUM 8.8* 8.6*   GFR: Estimated Creatinine Clearance: 23.8 mL/min (by C-G formula based on SCr of 1.25 mg/dL (H)). Liver Function Tests:  Recent Labs Lab 06/21/16 0404  AST 36  ALT 18  ALKPHOS 119  BILITOT 0.6  PROT 5.5*  ALBUMIN 2.8*   No results for input(s): LIPASE, AMYLASE in the last 168 hours. No results for input(s): AMMONIA in the last 168 hours. Coagulation Profile: No results for input(s): INR, PROTIME in the last 168 hours. Cardiac Enzymes: No results for input(s): CKTOTAL, CKMB, CKMBINDEX, TROPONINI in the last 168 hours. BNP (last 3 results) No results for input(s): PROBNP in the last 8760 hours. HbA1C: No results for input(s): HGBA1C in the last 72 hours. CBG: No results for input(s): GLUCAP in the last 168 hours. Lipid Profile: No results for input(s): CHOL, HDL, LDLCALC, TRIG, CHOLHDL, LDLDIRECT in the last 72 hours. Thyroid Function Tests: No results for input(s): TSH, T4TOTAL, FREET4, T3FREE, THYROIDAB in the last 72 hours. Anemia Panel: No results for input(s): VITAMINB12,  FOLATE, FERRITIN, TIBC, IRON, RETICCTPCT in the last 72 hours.  Sepsis Labs: No results for input(s): PROCALCITON, LATICACIDVEN in the last 168 hours.  No results found for this or any previous visit (from the past 240 hour(s)).       Radiology Studies: Dg Chest 1 View  Result Date: 06/20/2016 CLINICAL DATA:  82 year old male status post fall today. Initial encounter. EXAM: CHEST 1 VIEW COMPARISON:  02/27/2016, levin 11/15/2007. FINDINGS: Right upper extremity artifact projecting over the diaphragm. Cardiac silhouette has increased on this PA view compared to August. Other mediastinal contours are within normal limits. Chronic hyperinflation. No pneumothorax, pulmonary edema, pleural effusion or confluent pulmonary opacity. EKG button artifact projecting in the right upper lung. Osteopenia. Previously augmented upper lumbar compression fracture. No acute osseous abnormality identified. IMPRESSION: 1. Cardiac silhouette appears mildly increased since August. Consider pericardial effusion. 2. No other acute cardiopulmonary abnormality. Chronic hyperinflation. 3. Right upper extremity artifact projecting over the diaphragm. Electronically Signed   By: Genevie Ann M.D.   On: 06/20/2016 21:10   Dg Knee 2 Views Left  Result  Date: 06/20/2016 CLINICAL DATA:  Patient fell today. Swelling in the left knee. Lower leg feels numb. Not able to straighten knee. Severe pain. EXAM: LEFT KNEE - 1-2 VIEW COMPARISON:  None. FINDINGS: Diffuse bone demineralization. Postoperative changes with hardware fixation in the femoral metaphysis, incompletely included within the field of view. Oblique probably comminuted fractures of the left proximal tibial meta diaphysis with about 7 mm lateral displacement of the distal fracture fragment. Alignment appears intact. Slight cortical irregularity at the junction of the proximal fibular head neck is incompletely visualized but could also represent fibular fracture. Soft tissue  swelling. Prominent degenerative changes in the left knee with chondrocalcinosis and small osteochondral defect in the left medial femoral condyle. No significant effusion. Vascular calcifications. IMPRESSION: Diffuse bone demineralization with acute mildly displaced fracture of the proximal left tibia at the junction of the metaphysis and shaft. Probable nondisplaced fracture of the proximal left fibula. Electronically Signed   By: Lucienne Capers M.D.   On: 06/20/2016 21:10   Dg Tibia/fibula Left  Result Date: 06/20/2016 CLINICAL DATA:  [place] today with swelling in the left knee. Pain and limited range of motion. EXAM: LEFT TIBIA AND FIBULA - 2 VIEW COMPARISON:  None. FINDINGS: Diffuse bone demineralization. Acute oblique fracture of the proximal left tibia at the junction of the metaphysis and shaft. There is about 7 mm lateral displacement of the distal fracture fragment. Alignment is intact. Midshaft and distal tibia appear intact. Limited visualization of the proximal fibula. Possible fracture of the proximal fibula is not well evaluated on this study. See additional report of the left knee. Degenerative changes in the left knee and left ankle. Vascular calcifications throughout. IMPRESSION: Oblique fracture of the proximal left tibia with mild lateral displacement of the distal fracture fragment. Electronically Signed   By: Lucienne Capers M.D.   On: 06/20/2016 21:13        Scheduled Meds: . calcium carbonate  2 tablet Oral Daily  . cholecalciferol  1,000 Units Oral Daily  . feeding supplement (ENSURE ENLIVE)  237 mL Oral Daily  . Melatonin  3 mg Oral QHS  . multivitamin with minerals  1 tablet Oral Daily  . pantoprazole  40 mg Oral BID   Continuous Infusions:   LOS: 1 day       [facility], MD [facility] Pager [phone]  If 7PM-7AM, please contact night-coverage [email or web address] Password [facility] 06/21/2016, 3:16 PM

## 2016-06-21 NOTE — Consult Note (Signed)
ORTHOPAEDIC CONSULTATION  REQUESTING PHYSICIAN: Modena Jansky, MD  Chief Complaint: Left proximal tibia fracture  HPI: Sean [name] is a 82 y.o. male who presents with left proximal tibia fracture s/p mechanical fall PTA.  The patient endorses severe pain in the left lower leg, that does not radiate, grinding in quality, worse with any movement, better with immobilization.  Denies LOC/fever/chills/nausea/vomiting.  Walks with assistive devices (walker, cane, wheelchair).  Does live at home with wife but household ambulator.  Denies LOC, neck pain, abd pain.  Past Medical History:  Diagnosis Date  . ABUSE, ALCOHOL, IN REMISSION 06/17/2007  . Adenomatous colon polyp 01/1984  . Alcoholic cirrhosis of liver ([place]) 06/17/2007  . Aortic regurgitation 03/11/2016  . BLADDER CANCER 06/17/2007  . Cardiomyopathy ([place]) 03/11/2016  . CAROTID ARTERY DISEASE 07/23/2009  . COMPRESSION FRACTURE, LUMBAR VERTEBRAE 08/09/2009  . COPD 09/18/2008  . DIVERTICULOSIS, COLON 06/17/2007  . ECHOCARDIOGRAM, ABNORMAL 07/06/2008  . GERD 06/17/2007  . GLUCOSE INTOLERANCE 06/17/2007  . Hiatal hernia   . HYPERLIPIDEMIA 06/17/2007  . HYPERTENSION 06/17/2007  . Impaired glucose tolerance 12/13/2010  . Internal hemorrhoids   . LOW BACK PAIN, CHRONIC 12/17/2009  . OSTEOARTHRITIS, KNEES, BILATERAL 06/19/2009  . OSTEOPOROSIS 06/17/2007  . PEPTIC ULCER DISEASE 06/17/2007  . PSA, INCREASED 09/18/2008  . RENAL INSUFFICIENCY 09/18/2008  . Tubulovillous adenoma 04/2000   Duodenal polyp  . UNSPECIFIED SUDDEN HEARING LOSS 03/20/2009  . VITAMIN D DEFICIENCY 09/18/2008   Past Surgical History:  Procedure Laterality Date  . CHOLECYSTECTOMY  04/2000  . inguinal herniorrhapy    . LUMBAR LAMINECTOMY     x 2  . s/p Bilroth II    . s/p bowel obstruction surgury  12/01  . s/p left middle ear surgury  late 54's   Dr. Juanell Fairly   Social History   Social History  . Marital status: Married    Spouse name: N/A  . Number  of children: 2  . Years of education: N/A   Occupational History  . 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 Former 3 years in [employer], prior to that [employer] in [place] Topics  . Smoking status: Former Smoker    Quit date: 10/09/2004  . Smokeless tobacco: Never Used  . Alcohol use No  . Drug use: No  . Sexual activity: Not Asked   Other Topics Concern  . None   Social History Narrative  . None   Family History  Problem Relation Age of Onset  . Diabetes Other     5 siblings  . Heart attack Mother   . Emphysema Father   . Breast cancer Sister    Allergies  Allergen Reactions  . Amoxicillin-Pot Clavulanate     REACTION: rash  . Ibuprofen     REACTION: itching   Prior to Admission medications   Medication Sig Start Date End Date Taking? Authorizing Provider  calcium carbonate (TUMS - DOSED IN MG ELEMENTAL CALCIUM) 500 MG chewable tablet Chew 2 tablets by mouth daily.   Yes Historical Provider, MD  cholecalciferol (VITAMIN D) 1000 units tablet Take 1,000 Units by mouth daily.   Yes Historical Provider, MD  feeding supplement, ENSURE ENLIVE, (ENSURE ENLIVE) LIQD Take 237 mLs by mouth daily.   Yes Historical Provider, MD  Multiple Vitamin (MULTIVITAMINS PO) Take 1 tablet by mouth daily.    Yes Historical Provider, MD  omeprazole (PRILOSEC) 20 MG capsule Take 1 capsule (20  mg total) by mouth 2 (two) times daily before a meal. 10/01/15  Yes Biagio Borg, MD  oxyCODONE (ROXICODONE) 5 MG immediate release tablet Take 1 tablet (5 mg total) by mouth every 6 (six) hours as needed. Patient taking differently: Take 5 mg by mouth every 6 (six) hours as needed for moderate pain.  02/27/16  Yes Biagio Borg, MD  Rivaroxaban (XARELTO) 15 MG TABS tablet Take 1 tablet (15 mg total) by mouth daily with supper. 03/17/16  Yes Will Meredith Leeds, MD   Dg Chest 1 View  Result Date: 06/20/2016 CLINICAL DATA:  82 year old male status post fall today.  Initial encounter. EXAM: CHEST 1 VIEW COMPARISON:  02/27/2016, levin 11/15/2007. FINDINGS: Right upper extremity artifact projecting over the diaphragm. Cardiac silhouette has increased on this PA view compared to August. Other mediastinal contours are within normal limits. Chronic hyperinflation. No pneumothorax, pulmonary edema, pleural effusion or confluent pulmonary opacity. EKG button artifact projecting in the right upper lung. Osteopenia. Previously augmented upper lumbar compression fracture. No acute osseous abnormality identified. IMPRESSION: 1. Cardiac silhouette appears mildly increased since August. Consider pericardial effusion. 2. No other acute cardiopulmonary abnormality. Chronic hyperinflation. 3. Right upper extremity artifact projecting over the diaphragm. Electronically Signed   By: Genevie Ann M.D.   On: 06/20/2016 21:10   Dg Knee 2 Views Left  Result Date: 06/20/2016 CLINICAL DATA:  Patient fell today. Swelling in the left knee. Lower leg feels numb. Not able to straighten knee. Severe pain. EXAM: LEFT KNEE - 1-2 VIEW COMPARISON:  None. FINDINGS: Diffuse bone demineralization. Postoperative changes with hardware fixation in the femoral metaphysis, incompletely included within the field of view. Oblique probably comminuted fractures of the left proximal tibial meta diaphysis with about 7 mm lateral displacement of the distal fracture fragment. Alignment appears intact. Slight cortical irregularity at the junction of the proximal fibular head neck is incompletely visualized but could also represent fibular fracture. Soft tissue swelling. Prominent degenerative changes in the left knee with chondrocalcinosis and small osteochondral defect in the left medial femoral condyle. No significant effusion. Vascular calcifications. IMPRESSION: Diffuse bone demineralization with acute mildly displaced fracture of the proximal left tibia at the junction of the metaphysis and shaft. Probable nondisplaced  fracture of the proximal left fibula. Electronically Signed   By: Lucienne Capers M.D.   On: 06/20/2016 21:10   Dg Tibia/fibula Left  Result Date: 06/20/2016 CLINICAL DATA:  [place] today with swelling in the left knee. Pain and limited range of motion. EXAM: LEFT TIBIA AND FIBULA - 2 VIEW COMPARISON:  None. FINDINGS: Diffuse bone demineralization. Acute oblique fracture of the proximal left tibia at the junction of the metaphysis and shaft. There is about 7 mm lateral displacement of the distal fracture fragment. Alignment is intact. Midshaft and distal tibia appear intact. Limited visualization of the proximal fibula. Possible fracture of the proximal fibula is not well evaluated on this study. See additional report of the left knee. Degenerative changes in the left knee and left ankle. Vascular calcifications throughout. IMPRESSION: Oblique fracture of the proximal left tibia with mild lateral displacement of the distal fracture fragment. Electronically Signed   By: Lucienne Capers M.D.   On: 06/20/2016 21:13    All pertinent xrays, MRI, CT independently reviewed and interpreted  Positive ROS: All other systems have been reviewed and were otherwise negative with the exception of those mentioned in the HPI and as above.  Physical Exam: General: Alert, no acute distress Cardiovascular: No pedal edema  Respiratory: No cyanosis, no use of accessory musculature GI: No organomegaly, abdomen is soft and non-tender Skin: No lesions in the area of chief complaint Neurologic: Sensation intact distally Psychiatric: Patient is competent for consent with normal mood and affect Lymphatic: No axillary or cervical lymphadenopathy  MUSCULOSKELETAL:  - pain with movement of the extremity - skin intact - NVI distally - compartments soft - minimal pain with passive stretch  Assessment: Left proximal tibia fracture  Plan: - surgery is recommended, patient and family are aware of r/b/a and wish to  proceed - consent obtained - medical optimization per primary team - surgery is planned for Sunday morning - Based on history and fracture pattern this likely represents a fragility fracture. - Fragility fractures affect up to one half of women and one third of men after age 66 years and occur in the setting of bone disorder such as osteoporosis or osteopenia and warrant appropriate work-up. - The following are general recommendations that may serve as an outline for an appropriate work-up:  1.) Obtain bone density measurement to confirm presumptive diagnosis, assess severity of osteoporosis and risk of future fracture, and use as baseline for monitoring treatment  2.) Obtain laboratory tests: CBC, ESR, serum calcium, creatinine, albumin,phosphate, alkaline phosphatase, liver transaminases, protein electrophoresis, urinalysis, 25-hydroxyvitamin D.  3.) Exclude secondary causes of low bone mass and skeletal fragility (eg,multiple myeloma, lymphoma) as indicated.  4.) Obtain radiograph of thoracic and lumbar spine, particularly among individuals with back pain or height loss to assess presence of vertebral fractures  5.) Intermittent administration of recombinant human parathyroid hormone  6.) Optimize nutritional status using nutritional supplementation.  7.) Patient/family education to prevent future falls.  8.) Early mobilization and exercise program - exercise decreases the rate of bone loss and has been associated with decreased rate of fragility fractures   Thank you for the consult and the opportunity to see Sean [name]. Eduard Roux, MD [place] 10:21 AM

## 2016-06-22 ENCOUNTER — Inpatient Hospital Stay (HOSPITAL_COMMUNITY): Payer: Medicare Other

## 2016-06-22 ENCOUNTER — Inpatient Hospital Stay (HOSPITAL_COMMUNITY): Payer: Medicare Other | Admitting: Anesthesiology

## 2016-06-22 ENCOUNTER — Encounter (HOSPITAL_COMMUNITY)
Admission: EM | Disposition: A | Discharge status: Skilled Nursing Facility | Payer: Self-pay | Source: Home / Self Care | Attending: Internal Medicine

## 2016-06-22 ENCOUNTER — Encounter (HOSPITAL_COMMUNITY): Payer: Self-pay | Admitting: Certified Registered"

## 2016-06-22 DIAGNOSIS — D62 Acute posthemorrhagic anemia: Secondary | ICD-10-CM

## 2016-06-22 DIAGNOSIS — S82192A Other fracture of upper end of left tibia, initial encounter for closed fracture: Principal | ICD-10-CM

## 2016-06-22 DIAGNOSIS — S82162A Torus fracture of upper end of left tibia, initial encounter for closed fracture: Secondary | ICD-10-CM

## 2016-06-22 DIAGNOSIS — D696 Thrombocytopenia, unspecified: Secondary | ICD-10-CM

## 2016-06-22 HISTORY — PX: TIBIA IM NAIL INSERTION: SHX2516

## 2016-06-22 LAB — BASIC METABOLIC PANEL
Anion gap: 7 (ref 5–15)
BUN: 22 mg/dL — ABNORMAL HIGH (ref 6–20)
CHLORIDE: 104 mmol/L (ref 101–111)
CO2: 25 mmol/L (ref 22–32)
Calcium: 8.2 mg/dL — ABNORMAL LOW (ref 8.9–10.3)
Creatinine, Ser: 1.3 mg/dL — ABNORMAL HIGH (ref 0.61–1.24)
GFR calc non Af Amer: 47 mL/min — ABNORMAL LOW (ref >60)
GFR, EST AFRICAN AMERICAN: 54 mL/min — AB (ref >60)
Glucose, Bld: 98 mg/dL (ref 65–99)
POTASSIUM: 4.1 mmol/L (ref 3.5–5.1)
SODIUM: 136 mmol/L (ref 135–145)

## 2016-06-22 LAB — CBC
HEMATOCRIT: 24.9 % — AB (ref 39.0–52.0)
HEMOGLOBIN: 8.2 g/dL — AB (ref 13.0–17.0)
MCH: 32.3 pg (ref 26.0–34.0)
MCHC: 32.9 g/dL (ref 30.0–36.0)
MCV: 98 fL (ref 78.0–100.0)
Platelets: 121 10*3/uL — ABNORMAL LOW (ref 150–400)
RBC: 2.54 MIL/uL — AB (ref 4.22–5.81)
RDW: 15.1 % (ref 11.5–15.5)
WBC: 6.7 10*3/uL (ref 4.0–10.5)

## 2016-06-22 LAB — SURGICAL PCR SCREEN
MRSA, PCR: NEGATIVE
STAPHYLOCOCCUS AUREUS: NEGATIVE

## 2016-06-22 SURGERY — INSERTION, INTRAMEDULLARY ROD, TIBIA
Anesthesia: Monitor Anesthesia Care | Site: Leg Lower | Laterality: Left

## 2016-06-22 MED ORDER — MENTHOL 3 MG MT LOZG: 1.0000 | LOZENGE | OROMUCOSAL | Status: DC | PRN

## 2016-06-22 MED ORDER — ALUM & MAG HYDROXIDE-SIMETH 200-200-20 MG/5ML PO SUSP: 30.0000 mL | ORAL | Status: DC | PRN

## 2016-06-22 MED ORDER — MORPHINE SULFATE (PF) 2 MG/ML IV SOLN: 0.5000 mg | INTRAVENOUS | Status: DC | PRN

## 2016-06-22 MED ORDER — ONDANSETRON HCL 4 MG/2ML IJ SOLN: 4.0000 mg | Freq: Four times a day (QID) | INTRAMUSCULAR | Status: DC | PRN

## 2016-06-22 MED ORDER — LIDOCAINE-EPINEPHRINE (PF) 1.5 %-1:200000 IJ SOLN
INTRAMUSCULAR | Status: DC | PRN
  Administered 2016-06-22: 5 mL via PERINEURAL
  Administered 2016-06-22: 10 mL via PERINEURAL

## 2016-06-22 MED ORDER — FENTANYL CITRATE (PF) 100 MCG/2ML IJ SOLN
INTRAMUSCULAR | Status: DC | PRN
  Administered 2016-06-22: 50 ug via INTRAVENOUS

## 2016-06-22 MED ORDER — HYDROCODONE-ACETAMINOPHEN 5-325 MG PO TABS
1.0000 | ORAL_TABLET | Freq: Four times a day (QID) | ORAL | Status: DC | PRN
  Administered 2016-06-22 – 2016-06-23 (×3): 2 via ORAL
  Administered 2016-06-23: 1 via ORAL
  Administered 2016-06-24 – 2016-06-25 (×3): 2 via ORAL
  Filled 2016-06-22: qty 1
  Filled 2016-06-22 (×6): qty 2

## 2016-06-22 MED ORDER — METHOCARBAMOL 500 MG PO TABS
500.0000 mg | ORAL_TABLET | Freq: Four times a day (QID) | ORAL | Status: DC | PRN
  Administered 2016-06-23: 500 mg via ORAL
  Filled 2016-06-22: qty 1

## 2016-06-22 MED ORDER — CEFAZOLIN SODIUM-DEXTROSE 2-4 GM/100ML-% IV SOLN
2.0000 g | Freq: Four times a day (QID) | INTRAVENOUS | Status: AC
  Administered 2016-06-22 – 2016-06-23 (×2): 2 g via INTRAVENOUS
  Filled 2016-06-22 (×3): qty 100

## 2016-06-22 MED ORDER — RIVAROXABAN 15 MG PO TABS
15.0000 mg | ORAL_TABLET | Freq: Every day | ORAL | Status: DC
  Administered 2016-06-23 – 2016-06-24 (×2): 15 mg via ORAL
  Filled 2016-06-22 (×3): qty 1

## 2016-06-22 MED ORDER — ACETAMINOPHEN 325 MG PO TABS: 650.0000 mg | ORAL_TABLET | Freq: Four times a day (QID) | ORAL | Status: DC | PRN

## 2016-06-22 MED ORDER — ALUM & MAG HYDROXIDE-SIMETH 200-200-20 MG/5ML PO SUSP
30.0000 mL | ORAL | Status: DC | PRN
  Administered 2016-06-23: 30 mL via ORAL
  Filled 2016-06-22 (×2): qty 30

## 2016-06-22 MED ORDER — PHENOL 1.4 % MT LIQD: 1.0000 | OROMUCOSAL | Status: DC | PRN

## 2016-06-22 MED ORDER — ONDANSETRON HCL 4 MG PO TABS: 4.0000 mg | ORAL_TABLET | Freq: Four times a day (QID) | ORAL | Status: DC | PRN

## 2016-06-22 MED ORDER — SODIUM CHLORIDE 0.9 % IV SOLN
INTRAVENOUS | Status: DC
  Administered 2016-06-22: 13:00:00 via INTRAVENOUS

## 2016-06-22 MED ORDER — 0.9 % SODIUM CHLORIDE (POUR BTL) OPTIME
TOPICAL | Status: DC | PRN
  Administered 2016-06-22: 1000 mL

## 2016-06-22 MED ORDER — LACTATED RINGERS IV SOLN
INTRAVENOUS | Status: DC
  Administered 2016-06-22: 10:00:00 via INTRAVENOUS

## 2016-06-22 MED ORDER — PROPOFOL 500 MG/50ML IV EMUL
INTRAVENOUS | Status: DC | PRN
  Administered 2016-06-22: 40 ug/kg/min via INTRAVENOUS

## 2016-06-22 MED ORDER — ROPIVACAINE HCL 5 MG/ML IJ SOLN
INTRAMUSCULAR | Status: DC | PRN
  Administered 2016-06-22 (×2): 20 mL via PERINEURAL

## 2016-06-22 MED ORDER — CEFAZOLIN SODIUM 1 G IJ SOLR
INTRAMUSCULAR | Status: DC | PRN
  Administered 2016-06-22: 2 g via INTRAMUSCULAR

## 2016-06-22 MED ORDER — METOCLOPRAMIDE HCL 5 MG PO TABS
5.0000 mg | ORAL_TABLET | Freq: Three times a day (TID) | ORAL | Status: DC | PRN
Start: 2016-06-22 — End: 2016-06-25

## 2016-06-22 MED ORDER — CEFAZOLIN SODIUM 1 G IJ SOLR
INTRAMUSCULAR | Status: AC
  Filled 2016-06-22: qty 20

## 2016-06-22 MED ORDER — PROPOFOL 10 MG/ML IV BOLUS
INTRAVENOUS | Status: AC
  Filled 2016-06-22: qty 20

## 2016-06-22 MED ORDER — ACETAMINOPHEN 650 MG RE SUPP: 650.0000 mg | Freq: Four times a day (QID) | RECTAL | Status: DC | PRN

## 2016-06-22 MED ORDER — METHOCARBAMOL 1000 MG/10ML IJ SOLN
500.0000 mg | Freq: Four times a day (QID) | INTRAMUSCULAR | Status: DC | PRN
  Filled 2016-06-22: qty 5

## 2016-06-22 MED ORDER — FENTANYL CITRATE (PF) 100 MCG/2ML IJ SOLN
INTRAMUSCULAR | Status: AC
  Filled 2016-06-22: qty 2

## 2016-06-22 MED ORDER — METOCLOPRAMIDE HCL 5 MG/ML IJ SOLN
5.0000 mg | Freq: Three times a day (TID) | INTRAMUSCULAR | Status: DC | PRN
Start: 2016-06-22 — End: 2016-06-25

## 2016-06-22 SURGICAL SUPPLY — 67 items
BANDAGE ACE 4X5 VEL STRL LF (GAUZE/BANDAGES/DRESSINGS) ×3 IMPLANT
BANDAGE ACE 6X5 VEL STRL LF (GAUZE/BANDAGES/DRESSINGS) ×3 IMPLANT
BANDAGE ESMARK 6X9 LF (GAUZE/BANDAGES/DRESSINGS) ×1 IMPLANT
BIT DRILL AO GAMMA 4.2X130 (BIT) ×3 IMPLANT
BIT DRILL AO GAMMA 4.2X180 (BIT) ×3 IMPLANT
BIT DRILL AO GAMMA 4.2X340 (BIT) ×3 IMPLANT
BNDG ESMARK 6X9 LF (GAUZE/BANDAGES/DRESSINGS) ×3
BNDG GAUZE ELAST 4 BULKY (GAUZE/BANDAGES/DRESSINGS) ×3 IMPLANT
COVER MAYO STAND STRL (DRAPES) ×3 IMPLANT
COVER SURGICAL LIGHT HANDLE (MISCELLANEOUS) ×3 IMPLANT
CUFF TOURNIQUET SINGLE 34IN LL (TOURNIQUET CUFF) IMPLANT
DRAPE C-ARM 42X72 X-RAY (DRAPES) ×3 IMPLANT
DRAPE C-ARMOR (DRAPES) ×3 IMPLANT
DRAPE HALF SHEET 40X57 (DRAPES) IMPLANT
DRAPE IMP U-DRAPE 54X76 (DRAPES) ×3 IMPLANT
DRAPE INCISE IOBAN 66X45 STRL (DRAPES) ×3 IMPLANT
DRAPE ORTHO SPLIT 77X108 STRL (DRAPES) ×4
DRAPE POUCH INSTRU U-SHP 10X18 (DRAPES) ×3 IMPLANT
DRAPE SURG ORHT 6 SPLT 77X108 (DRAPES) ×2 IMPLANT
DRAPE U-SHAPE 47X51 STRL (DRAPES) ×3 IMPLANT
DRAPE UTILITY XL STRL (DRAPES) IMPLANT
DRSG PAD ABDOMINAL 8X10 ST (GAUZE/BANDAGES/DRESSINGS) ×3 IMPLANT
DURAPREP 26ML APPLICATOR (WOUND CARE) ×3 IMPLANT
ELECT CAUTERY BLADE 6.4 (BLADE) ×3 IMPLANT
ELECT REM PT RETURN 9FT ADLT (ELECTROSURGICAL) ×3
ELECTRODE REM PT RTRN 9FT ADLT (ELECTROSURGICAL) ×1 IMPLANT
FACESHIELD WRAPAROUND (MASK) IMPLANT
GAUZE SPONGE 4X4 12PLY STRL (GAUZE/BANDAGES/DRESSINGS) ×3 IMPLANT
GAUZE XEROFORM 1X8 LF (GAUZE/BANDAGES/DRESSINGS) IMPLANT
GAUZE XEROFORM 5X9 LF (GAUZE/BANDAGES/DRESSINGS) ×3 IMPLANT
GLOVE BIO SURGEON STRL SZ 6.5 (GLOVE) ×2 IMPLANT
GLOVE BIO SURGEONS STRL SZ 6.5 (GLOVE) ×1
GLOVE BIOGEL PI IND STRL 6.5 (GLOVE) ×1 IMPLANT
GLOVE BIOGEL PI INDICATOR 6.5 (GLOVE) ×2
GLOVE SKINSENSE NS SZ7.5 (GLOVE) ×6
GLOVE SKINSENSE STRL SZ7.5 (GLOVE) ×3 IMPLANT
GOWN STRL REIN XL XLG (GOWN DISPOSABLE) ×3 IMPLANT
GUIDEROD T2 3X1000 (ROD) ×3 IMPLANT
GUIDEWIRE GAMMA (WIRE) ×6 IMPLANT
K-WIRE FIXATION 3X285 COATED (WIRE) ×6
KIT BASIN OR (CUSTOM PROCEDURE TRAY) ×3 IMPLANT
KWIRE FIXATION 3X285 COATED (WIRE) ×2 IMPLANT
MANIFOLD NEPTUNE II (INSTRUMENTS) ×3 IMPLANT
NAIL INSERTION SLEEVE,ELASTIC ×3 IMPLANT
NAIL TIBIAL STD 11X345MM (Nail) ×2 IMPLANT
NS IRRIG 1000ML POUR BTL (IV SOLUTION) ×3 IMPLANT
PACK TOTAL JOINT (CUSTOM PROCEDURE TRAY) IMPLANT
PACK UNIVERSAL I (CUSTOM PROCEDURE TRAY) IMPLANT
PAD CAST 4YDX4 CTTN HI CHSV (CAST SUPPLIES) IMPLANT
PADDING CAST COTTON 4X4 STRL (CAST SUPPLIES)
REAMER INTRAMEDULLARY 8MM 510 (MISCELLANEOUS) ×2 IMPLANT
SCREW LOCK FULL THREAD 05X57.5 (Screw) ×3 IMPLANT
SCREW LOCKING T2 F/T  5MMX50MM (Screw) ×2 IMPLANT
SCREW LOCKING T2 F/T  5MMX65MM (Screw) ×2 IMPLANT
SCREW LOCKING T2 F/T  5X37.5MM (Screw) ×2 IMPLANT
SCREW LOCKING T2 F/T  5X42.5MM (Screw) ×2 IMPLANT
SCREW LOCKING T2 F/T 5MMX50MM (Screw) ×1 IMPLANT
SCREW LOCKING T2 F/T 5MMX65MM (Screw) ×1 IMPLANT
SCREW LOCKING T2 F/T 5X37.5MM (Screw) ×1 IMPLANT
SCREW LOCKING T2 F/T 5X42.5MM (Screw) ×1 IMPLANT
STAPLER SKIN PROX WIDE 3.9 (STAPLE) ×3 IMPLANT
SUT VIC AB 0 CT1 27 (SUTURE) ×2
SUT VIC AB 0 CT1 27XBRD ANTBC (SUTURE) ×1 IMPLANT
SUT VIC AB 2-0 CT1 27 (SUTURE) ×2
SUT VIC AB 2-0 CT1 TAPERPNT 27 (SUTURE) ×1 IMPLANT
TOWEL OR 17X26 10 PK STRL BLUE (TOWEL DISPOSABLE) ×6 IMPLANT
WATER STERILE IRR 1000ML POUR (IV SOLUTION) ×3 IMPLANT

## 2016-06-22 NOTE — Progress Notes (Signed)
PROGRESS NOTE  Sean [name]  [id number] DOB: 03-28-1925  DOA: 06/20/2016 PCP: Cathlean Cower, MD   Brief Narrative:  82 year old male, married, lives with spouse, PMH of HTN, HLD, A. fib on Xarelto, PVD, COPD, GERD, alcoholic cirrhosis, visual impairment, hard of hearing, severe peripheral neuropathy, presented to [facility] following a mechanical fall at home and sustained left tibial fracture. He stated that he was bending down to get something out of the refrigerator when he lost his footing and sat to the floor and heard a pop. Family gives history of at least 3-4 falls within the last month. He continues to drive. Orthopedics have seen and underwent surgery on 11/26.   Assessment & Plan:   Principal Problem:   Closed tibia fracture Active Problems:   COPD (chronic obstructive pulmonary disease) (HCC)   GERD   CKD (chronic kidney disease)   Atrial fibrillation (HCC)   Cardiomyopathy (HCC)   Anemia of chronic disease   Chronic anticoagulation   1. Left proximal tibial fracture: Sustained status post mechanical fall. Orthopedics consultation appreciated and underwent surgical fixation on 11/26. Based upon available data, patient is at least at moderate risk for perioperative CV events based on advanced age, frail physical health, multiple significant comorbidities which seem optimized.  2. A. fib: Recently saw his cardiologist on 06/18/16. CHA2DS2-VASc Score : 3. Last dose of Xarelto was on 06/19/16. Continue to hold for surgery tomorrow. Resume postop when okay with orthopedics. Patient is not on rate control medications at home. Discussed with primary cardiologist Dr. Allegra Lai on 11/26 and agreed that since patient will be in a monitored setting at [facility] post discharge, may continue anticoagulation but will need to be reassessed after discharge from SNF. 3. Essential hypertension: Controlled. 4. Frequent falls: Apparently has sustained 3-4 falls in the last month. Etiology is  multifactorial secondary to advanced age, frail physical status, significant peripheral neuropathy, visual impairment, hard of hearing and multiple comorbidities. Since patient will be going to SNF postop, reasonable to continue Xarelto in closely monitored setting. However if he were to return home, decision regarding continued long-term anticoagulation will need to be reassessed. Patient was counseled that it is not safe for him to drive anymore. 5. COPD: Stable without clinical bronchospasm. Monitor closely. 6. Anemia/ABLA: Hemoglobin dropped from 10.6 on admission to 8.2 preop. Follow CBCs closely and may need transfusion if hemoglobin <7 g per DL. 7. Stage III chronic kidney disease: Creatinine at baseline 8. GERD: Continue PPI 9. Cardiomyopathy: Last echo 03/10/16 showed EF 50-55 percent. Clinically euvolemic. 10. Adult failure to thrive: Multifactorial. 11. Thrombocytopenia: Possibly related to acute blood loss. Follow CBC in a.m.   DVT prophylaxis: SCDs Code Status: Full Family Communication: Discussed in detail with patient's spouse, son, grandson and daughter-in-law at bedside prior to surgery on 11/26 Disposition Plan: DC to SNF when medically stable   Consultants:   Orthopedics  Procedures:   Left tibial closed reduction and IM nailing & closed treatment of left fibular shaft fracture with manipulation.  Antimicrobials:   None    Subjective: Seen this morning prior to surgery. Denied chest pain or dyspnea. As per grandson who stayed with patient overnight, pain controlled except when he was moved.  Objective:  Vitals:   06/22/16 0400 06/22/16 0458 06/22/16 1204 06/22/16 1220  BP:  (!) 99/43 (!) 114/45 (!) 117/53  Pulse:  71 60 69  Resp: 16 16 15 18   Temp:  99.5 F (37.5 C) 97.8 F (36.6 C)   TempSrc:  Oral    SpO2:  99% 100% 100%  Weight:      Height:        Intake/Output Summary (Last 24 hours) at 06/22/16 1232 Last data filed at 06/22/16 1200  Gross per  24 hour  Intake             1120 ml  Output             1970 ml  Net             -850 ml   Filed Weights   06/20/16 2318  Weight: 42 kg (92 lb 11.2 oz)    Examination:  General exam: Pleasant elderly male, moderately built, extremely frail& Chronically ill-looking, lying comfortably propped up in bed without distress. Respiratory system: Clear to auscultation. Respiratory effort normal. Cardiovascular system: S1 & S2 heard, RRR. No JVD, murmurs, rubs, gallops or clicks. No pedal edema. Gastrointestinal system: Abdomen is nondistended, soft and nontender. No organomegaly or masses felt. Normal bowel sounds heard. Central nervous system: Alert and oriented. No focal neurological deficits. Hard of hearing. Extremities: Symmetric 5 x 5 power. Left lower extremity movements restricted secondary to pain. Skin: No rashes, lesions or ulcers Psychiatry: Judgement and insight appear impaired. Mood & affect appropriate.     Data Reviewed: I have personally reviewed following labs and imaging studies  CBC:  Recent Labs Lab 06/20/16 2019 06/21/16 0404 06/22/16 0441  WBC 9.5 7.8 6.7  NEUTROABS 7.0  --   --   HGB 10.6* 9.2* 8.2*  HCT 32.5* 28.3* 24.9*  MCV 98.8 99.0 98.0  PLT 190 164 [id number]*   Basic Metabolic Panel:  Recent Labs Lab 06/20/16 2019 06/21/16 0404 06/22/16 0441  NA 138 139 136  K 4.6 4.3 4.1  CL 104 103 104  CO2 27 28 25   GLUCOSE 122* 100* 98  BUN 20 21* 22*  CREATININE 1.29* 1.25* 1.30*  CALCIUM 8.8* 8.6* 8.2*   GFR: Estimated Creatinine Clearance: 22.9 mL/min (by C-G formula based on SCr of 1.3 mg/dL (H)). Liver Function Tests:  Recent Labs Lab 06/21/16 0404  AST 36  ALT 18  ALKPHOS 119  BILITOT 0.6  PROT 5.5*  ALBUMIN 2.8*   No results for input(s): LIPASE, AMYLASE in the last 168 hours. No results for input(s): AMMONIA in the last 168 hours. Coagulation Profile: No results for input(s): INR, PROTIME in the last 168 hours. Cardiac Enzymes: No  results for input(s): CKTOTAL, CKMB, CKMBINDEX, TROPONINI in the last 168 hours. BNP (last 3 results) No results for input(s): PROBNP in the last 8760 hours. HbA1C: No results for input(s): HGBA1C in the last 72 hours. CBG: No results for input(s): GLUCAP in the last 168 hours. Lipid Profile: No results for input(s): CHOL, HDL, LDLCALC, TRIG, CHOLHDL, LDLDIRECT in the last 72 hours. Thyroid Function Tests: No results for input(s): TSH, T4TOTAL, FREET4, T3FREE, THYROIDAB in the last 72 hours. Anemia Panel: No results for input(s): VITAMINB12, FOLATE, FERRITIN, TIBC, IRON, RETICCTPCT in the last 72 hours.  Sepsis Labs: No results for input(s): PROCALCITON, LATICACIDVEN in the last 168 hours.  Recent Results (from the past 240 hour(s))  Surgical pcr screen     Status: None   Collection Time: 06/21/16  9:11 PM  Result Value Ref Range Status   MRSA, PCR NEGATIVE NEGATIVE Final   Staphylococcus aureus NEGATIVE NEGATIVE Final    Comment:        The Xpert SA Assay (FDA approved for NASAL specimens in patients over  71 years of age), is one component of a comprehensive surveillance program.  Test performance has been validated by [facility] for patients greater than or equal to 14 year old. It is not intended to diagnose infection nor to guide or monitor treatment.          Radiology Studies: Dg Chest 1 View  Result Date: 06/20/2016 CLINICAL DATA:  82 year old male status post fall today. Initial encounter. EXAM: CHEST 1 VIEW COMPARISON:  02/27/2016, levin 11/15/2007. FINDINGS: Right upper extremity artifact projecting over the diaphragm. Cardiac silhouette has increased on this PA view compared to August. Other mediastinal contours are within normal limits. Chronic hyperinflation. No pneumothorax, pulmonary edema, pleural effusion or confluent pulmonary opacity. EKG button artifact projecting in the right upper lung. Osteopenia. Previously augmented upper lumbar compression  fracture. No acute osseous abnormality identified. IMPRESSION: 1. Cardiac silhouette appears mildly increased since August. Consider pericardial effusion. 2. No other acute cardiopulmonary abnormality. Chronic hyperinflation. 3. Right upper extremity artifact projecting over the diaphragm. Electronically Signed   By: Genevie Ann M.D.   On: 06/20/2016 21:10   Dg Knee 2 Views Left  Result Date: 06/20/2016 CLINICAL DATA:  Patient fell today. Swelling in the left knee. Lower leg feels numb. Not able to straighten knee. Severe pain. EXAM: LEFT KNEE - 1-2 VIEW COMPARISON:  None. FINDINGS: Diffuse bone demineralization. Postoperative changes with hardware fixation in the femoral metaphysis, incompletely included within the field of view. Oblique probably comminuted fractures of the left proximal tibial meta diaphysis with about 7 mm lateral displacement of the distal fracture fragment. Alignment appears intact. Slight cortical irregularity at the junction of the proximal fibular head neck is incompletely visualized but could also represent fibular fracture. Soft tissue swelling. Prominent degenerative changes in the left knee with chondrocalcinosis and small osteochondral defect in the left medial femoral condyle. No significant effusion. Vascular calcifications. IMPRESSION: Diffuse bone demineralization with acute mildly displaced fracture of the proximal left tibia at the junction of the metaphysis and shaft. Probable nondisplaced fracture of the proximal left fibula. Electronically Signed   By: Lucienne Capers M.D.   On: 06/20/2016 21:10   Dg Tibia/fibula Left  Result Date: 06/20/2016 CLINICAL DATA:  [place] today with swelling in the left knee. Pain and limited range of motion. EXAM: LEFT TIBIA AND FIBULA - 2 VIEW COMPARISON:  None. FINDINGS: Diffuse bone demineralization. Acute oblique fracture of the proximal left tibia at the junction of the metaphysis and shaft. There is about 7 mm lateral displacement of the  distal fracture fragment. Alignment is intact. Midshaft and distal tibia appear intact. Limited visualization of the proximal fibula. Possible fracture of the proximal fibula is not well evaluated on this study. See additional report of the left knee. Degenerative changes in the left knee and left ankle. Vascular calcifications throughout. IMPRESSION: Oblique fracture of the proximal left tibia with mild lateral displacement of the distal fracture fragment. Electronically Signed   By: Lucienne Capers M.D.   On: 06/20/2016 21:13        Scheduled Meds: . [MAR Hold] calcium carbonate  2 tablet Oral Daily  . [MAR Hold] cholecalciferol  1,000 Units Oral Daily  . [MAR Hold] feeding supplement (ENSURE ENLIVE)  237 mL Oral Daily  . [MAR Hold] Melatonin  3 mg Oral QHS  . [MAR Hold] multivitamin with minerals  1 tablet Oral Daily  . [MAR Hold] pantoprazole  40 mg Oral BID   Continuous Infusions: . lactated ringers  LOS: 2 days       [facility], MD [facility] Pager [phone]  If 7PM-7AM, please contact night-coverage [email or web address] Password [facility] 06/22/2016, 12:32 PM

## 2016-06-22 NOTE — Anesthesia Procedure Notes (Signed)
Anesthesia Regional Block:  Nerve block type: Sciatic block.  Pre-Anesthetic Checklist: ,, timeout performed, Correct Patient, Correct Site, Correct Laterality, Correct Procedure, Correct Position, site marked, Risks and benefits discussed,  Surgical consent,  Pre-op evaluation,  At surgeon's request and post-op pain management  Laterality: Lower and Left  Prep: chloraprep       Needles:  Injection technique: Single-shot  Needle Type: Echogenic Needle          Additional Needles:  Procedures: ultrasound guided (picture in chart) and nerve stimulator Nerve block type: Sciatic block.  Nerve Stimulator or Paresthesia:  Response: plantarflexion, 0.5 mA,   Additional Responses:   Narrative:  Injection made incrementally with aspirations every 5 mL.  Performed by: Personally   Additional Notes: H+P and labs reviewed, risks and benefits discussed with patient, procedure tolerated well without complications

## 2016-06-22 NOTE — Anesthesia Postprocedure Evaluation (Signed)
Anesthesia Post Note  Patient: Sean [name]  Procedure(s) Performed: Procedure(s) (LRB): INTRAMEDULLARY (IM) NAIL TIBIAL (Left)  Patient location during evaluation: PACU Anesthesia Type: Regional Level of consciousness: awake Pain management: pain level controlled Vital Signs Assessment: post-procedure vital signs reviewed and stable Respiratory status: spontaneous breathing Cardiovascular status: stable Postop Assessment: no signs of nausea or vomiting Anesthetic complications: no    Last Vitals:  Vitals:   06/22/16 1233 06/22/16 1247  BP: (!) 144/54 (!) 143/56  Pulse: 69 (!) 59  Resp: 20 15  Temp:  36.8 C    Last Pain:  Vitals:   06/22/16 0831  TempSrc:   PainSc: 0-No pain                 Sebastyan Snodgrass

## 2016-06-22 NOTE — H&P (Signed)

## 2016-06-22 NOTE — Progress Notes (Signed)
Orthopedic Tech Progress Note Patient Details:  Sean [name] Apr 26, 1925 [id number]  Ortho Devices Type of Ortho Device: Knee Immobilizer Ortho Device/Splint Location: lle Ortho Device/Splint Interventions: Application   Sean [name] 06/22/2016, 12:44 PM

## 2016-06-22 NOTE — Anesthesia Preprocedure Evaluation (Signed)
Anesthesia Evaluation  Patient identified by MRN, date of birth, ID band Patient awake    Reviewed: Allergy & Precautions, NPO status , Patient's Chart, lab work & pertinent test results  History of Anesthesia Complications Negative for: history of anesthetic complications  Airway Mallampati: II  TM Distance: >3 FB Neck ROM: Full    Dental  (+) Edentulous Upper, Edentulous Lower   Pulmonary COPD, former smoker,    breath sounds clear to auscultation       Cardiovascular hypertension, + Peripheral Vascular Disease  + Valvular Problems/Murmurs AI and MR  Rhythm:Irregular     Neuro/Psych  Neuromuscular disease    GI/Hepatic Neg liver ROS, hiatal hernia, PUD, GERD  ,  Endo/Other    Renal/GU Renal InsufficiencyRenal disease     Musculoskeletal  (+) Arthritis ,   Abdominal   Peds  Hematology  (+) anemia ,   Anesthesia Other Findings   Reproductive/Obstetrics                             Anesthesia Physical Anesthesia Plan  ASA: III  Anesthesia Plan: Regional   Post-op Pain Management:    Induction: Intravenous  Airway Management Planned: Natural Airway, Nasal Cannula and Simple Face Mask  Additional Equipment:   Intra-op Plan:   Post-operative Plan:   Informed Consent: I have reviewed the patients History and Physical, chart, labs and discussed the procedure including the risks, benefits and alternatives for the proposed anesthesia with the patient or authorized representative who has indicated his/her understanding and acceptance.   Dental advisory given  Plan Discussed with: CRNA and Surgeon  Anesthesia Plan Comments:         Anesthesia Quick Evaluation

## 2016-06-22 NOTE — Transfer of Care (Signed)
Immediate Anesthesia Transfer of Care Note  Patient: BRIJESH VITE  Procedure(s) Performed: Procedure(s): INTRAMEDULLARY (IM) NAIL TIBIAL (Left)  Patient Location: PACU  Anesthesia Type:MAC combined with regional for post-op pain  Level of Consciousness: awake, alert  and patient cooperative  Airway & Oxygen Therapy: Patient Spontanous Breathing and Patient connected to nasal cannula oxygen  Post-op Assessment: Report given to RN and Post -op Vital signs reviewed and stable  Post vital signs: Reviewed and stable  Last Vitals:  Vitals:   06/22/16 0400 06/22/16 0458  BP:  (!) 99/43  Pulse:  71  Resp: 16 16  Temp:  37.5 C    Last Pain:  Vitals:   06/22/16 0831  TempSrc:   PainSc: 0-No pain         Complications: No apparent anesthesia complications

## 2016-06-22 NOTE — Op Note (Signed)
Date of Surgery: 06/22/2016  INDICATIONS: [name] is a 82 y.o.-year-old male who was involved in a ground level fall and sustained a left tibia fracture. The risks and benefits of the procedure discussed with the patient prior to the procedure and all questions were answered; consent was obtained.  PREOPERATIVE DIAGNOSIS:  left tibia fracture  POSTOPERATIVE DIAGNOSIS: Same  PROCEDURE:   1. left tibia closed reduction and intramedullary nailing CPT: [id number]  2. closed treatment of left fibular shaft fracture with manipulation, CPT [id number]  SURGEON: N. Eduard Roux, M.D.  ASSISTANT: none .  ANESTHESIA:  regional  IV FLUIDS AND URINE: See anesthesia record.  ESTIMATED BLOOD LOSS: minimal mL.  IMPLANTS: Stryker 11 x 34.5 nail   DRAINS: None.  COMPLICATIONS: None.  DESCRIPTION OF PROCEDURE: The patient was brought to the operating room and placed supine on the operating table.  The patient's leg had been signed prior to the procedure.  The patient had the anesthesia placed by the anesthesiologist.  The prep verification and incision time-outs were performed to confirm that this was the correct patient, site, side and location. The patient had an SCD on the opposite lower extremity. The patient did receive antibiotics prior to the incision and was re-dosed during the procedure as needed at indicated intervals.  The patient had the lower extremity prepped and draped in the standard surgical fashion.  The incision was first made over the quadriceps tendon in the midline and taken down to the skin and subcutaneous tissue to expose the peritenon. The peritenon was incised in line with the skin incision and then a poke hole was made in the quadriceps tendon in the midline. A knife was then used to longitudinally divide the tendon in line with its fibers, taking care not to cross over any fibers. The inserter was then placed across the patellofemoral compartment then the guide wire was placed at  the proximal, anterior tibia, confirming its location on both AP and lateral views. The wire was drilled into the bone and then the opening reamer was placed over this and maneuvered so that the reamer was parallel with anterior cortex of the tibia. The ball-tipped guide wire was then placed down into the canal towards the fracture site. The fracture was reduced and the wire was passed and confirmed to be in the proper location on both AP and lateral views.  The measuring stick was used to measure the length of the nail.  Sequential reaming was then performed, then the nail was gently hammered into place over the guide wire and the guide wire was removed. The proximal screws were placed through the interlocking drill guide using the sleeve. The distal screws were placed using the perfect circles technique. All screws were placed in the standard fashion, first incising the skin and then spreading with a tonsil, then drilling, measuring with a depth gauge, and then placing the screws by hand. The final x-rays were taken in both AP and lateral views to confirm the fracture reduction as well as the placement of all hardware. The fibula fracture was treated in a closed manner.  The wounds were copiously irrigated with saline and then the peritenon was closed with 0 Vicryl figure-of-eight interrupted sutures. 2.0 vicryl was used to close the subcutaneous layer.  staples was then used to close all of the open incision wounds.  The wounds were cleaned and dried a final time and a sterile dressing was placed. The patient was then placed in a short  leg splint in neutral ankle dorsiflexion. The patient's calf was soft to palpation at the end of the case.  The patient was then transferred to a bed and taken to the recovery room in stable condition.  All counts were correct at the end of the case.  POSTOPERATIVE PLAN: [name] will be NWB and will return 2 weeks for suture removal.  [name] will receive DVT  prophylaxis.  Azucena Cecil, MD [place] 12:02 PM

## 2016-06-22 NOTE — Anesthesia Procedure Notes (Signed)
Anesthesia Regional Block:  Femoral nerve block  Pre-Anesthetic Checklist: ,, timeout performed, Correct Patient, Correct Site, Correct Laterality, Correct Procedure, Correct Position, site marked, Risks and benefits discussed,  Surgical consent,  Pre-op evaluation,  At surgeon's request and post-op pain management  Laterality: Lower and Left  Prep: chloraprep       Needles:  Injection technique: Single-shot  Needle Type: Echogenic Stimulator Needle          Additional Needles:  Procedures: ultrasound guided (picture in chart) and nerve stimulator Femoral nerve block  Nerve Stimulator or Paresthesia:  Response: quad, 0.5 mA,   Additional Responses:   Narrative:  Injection made incrementally with aspirations every 5 mL.  Performed by: Personally  Anesthesiologist: Amarian Botero  Additional Notes: H+P and labs reviewed, risks and benefits discussed with patient, procedure tolerated well without complications

## 2016-06-23 LAB — BASIC METABOLIC PANEL
ANION GAP: 7 (ref 5–15)
BUN: 25 mg/dL — AB (ref 6–20)
CALCIUM: 8.1 mg/dL — AB (ref 8.9–10.3)
CO2: 25 mmol/L (ref 22–32)
CREATININE: 1.2 mg/dL (ref 0.61–1.24)
Chloride: 103 mmol/L (ref 101–111)
GFR calc Af Amer: 60 mL/min — ABNORMAL LOW (ref >60)
GFR calc non Af Amer: 52 mL/min — ABNORMAL LOW (ref >60)
GLUCOSE: 127 mg/dL — AB (ref 65–99)
Potassium: 4.6 mmol/L (ref 3.5–5.1)
Sodium: 135 mmol/L (ref 135–145)

## 2016-06-23 LAB — CBC
HEMATOCRIT: 24 % — AB (ref 39.0–52.0)
Hemoglobin: 8 g/dL — ABNORMAL LOW (ref 13.0–17.0)
MCH: 32.5 pg (ref 26.0–34.0)
MCHC: 33.3 g/dL (ref 30.0–36.0)
MCV: 97.6 fL (ref 78.0–100.0)
Platelets: 115 10*3/uL — ABNORMAL LOW (ref 150–400)
RBC: 2.46 MIL/uL — ABNORMAL LOW (ref 4.22–5.81)
RDW: 15.2 % (ref 11.5–15.5)
WBC: 8.6 10*3/uL (ref 4.0–10.5)

## 2016-06-23 NOTE — Discharge Instructions (Signed)

## 2016-06-23 NOTE — Progress Notes (Signed)
Orthopedic Tech Progress Note Patient Details:  Sean [name] August 29, 1924 [id number]  Ortho Devices Type of Ortho Device: Knee Immobilizer Ortho Device/Splint Location: Provided Knee Immoblizer (Previous Knee immoblizer was missplaced) Ortho Device/Splint Interventions: Application   Kristopher Oppenheim 06/23/2016, 11:35 AM

## 2016-06-23 NOTE — Progress Notes (Addendum)
PROGRESS NOTE  Sean [name]  [id number] DOB: 1925/02/01  DOA: 06/20/2016   PCP: Cathlean Cower, MD   Brief Narrative:  82 year old male, married, lives with spouse, PMH of HTN, HLD, A. fib on Xarelto, PVD, COPD, GERD, alcoholic cirrhosis, visual impairment, hard of hearing, severe peripheral neuropathy, presented to [facility] following a mechanical fall at home and sustained left tibial fracture. He stated that he was bending down to get something out of the refrigerator when he lost his footing and sat to the floor and heard a pop. Family gives history of at least 3-4 falls within the last month. He continues to drive. Orthopedics have seen and underwent surgery on 11/26.  Assessment & Plan:  1. Left proximal tibial fracture: Sustained status post mechanical fall. Orthopedics consultation appreciated and underwent surgical fixation on 11/26. PT/OT eval to be done today, suspect pt will need SNF 2. A. fib: Recently saw his cardiologist on 06/18/16. CHA2DS2-VASc Score: 3. Last dose of Xarelto was on 06/19/16. Resume Xarelto post op. Patient is not on rate control medications at home. Dr. Algis Liming discussed with primary cardiologist Dr. Allegra Lai on 11/26 and agreed that since patient will be in a monitored setting at [facility] post discharge, may continue anticoagulation but will need to be reassessed after discharge from SNF 3. Essential hypertension: Reasonable inpatient control.  4. Frequent falls: Apparently has sustained 3-4 falls in the last month. Etiology is multifactorial secondary to advanced age, frail physical status, significant peripheral neuropathy, visual impairment, hard of hearing and multiple comorbidities. Since patient will be going to SNF postop, reasonable to continue Xarelto in closely monitored setting. However if he were to return home, decision regarding continued long-term anticoagulation will need to be reassessed. Patient was counseled that it is not safe for him to drive  anymore. 5. COPD: Stable without clinical bronchospasm. Maintaining oxygen saturations at target range.  6. Anemia/ABLA: Hemoglobin dropped from 10.6 on admission to 8.2 preop. Hg stable in the past 48 hours. No sings of active bleeding. CBC in AM.  7. Acute kidney injury imposed on Stage III cCKD: Creatinine at baseline, WNL this AM.  8. GERD: Continue PPI 9. Cardiomyopathy: Last echo 03/10/16 showed EF 50-55 percent. Clinically euvolemic. 10. Adult failure to thrive, underweight: Multifactorial. Body mass index is 13.3 kg/m 11. Thrombocytopenia: Possibly related to acute blood loss. No sings of bleeding, CBC in AM.   DVT prophylaxis: SCDs, Xarelto Code Status: Full Family Communication: Discussed with pt at bedside, no family in the room  Disposition Plan: DC to SNF in am  Consultants:   Orthopedics  Procedures:   Left tibial closed reduction and IM nailing & closed treatment of left fibular shaft fracture with manipulation 11/26.  Antimicrobials:   None   Subjective: No concerns this AM.  Objective:  Vitals:   06/22/16 1331 06/22/16 1940 06/23/16 0046 06/23/16 0528  BP: (!) 115/59 (!) 116/50 (!) 99/44 (!) 118/43  Pulse: 75 74 83 67  Resp:  16 16 16   Temp: 97.5 F (36.4 C) 97.6 F (36.4 C) 99.1 F (37.3 C) 99 F (37.2 C)  TempSrc: Oral Oral Oral Oral  SpO2: 100% 94% 100% 97%  Weight:      Height:        Intake/Output Summary (Last 24 hours) at 06/23/16 1224 Last data filed at 06/23/16 0900  Gross per 24 hour  Intake             1520 ml  Output  0 ml  Net             1520 ml   Filed Weights   06/20/16 2318  Weight: 42 kg (92 lb 11.2 oz)    Examination:  General exam: Pleasant elderly male, extremely frail & Chronically ill-looking, NAD Respiratory system: Clear to auscultation. Respiratory effort normal. Cardiovascular system: S1 & S2 heard, RRR. No JVD, murmurs, rubs, gallops or clicks. No pedal edema. Gastrointestinal system: Abdomen is  nondistended, soft and nontender. No organomegaly or masses felt.  Central nervous system: Alert and oriented. No focal neurological deficits. Hard of hearing. Extremities: Symmetric 5 x 5 power. Left lower extremity movements restricted secondary to pain.  Data Reviewed: I have personally reviewed following labs and imaging studies  CBC:  Recent Labs Lab 06/20/16 2019 06/21/16 0404 06/22/16 0441 06/23/16 0615  WBC 9.5 7.8 6.7 8.6  NEUTROABS 7.0  --   --   --   HGB 10.6* 9.2* 8.2* 8.0*  HCT 32.5* 28.3* 24.9* 24.0*  MCV 98.8 99.0 98.0 97.6  PLT 190 164 121* [id number]*   Basic Metabolic Panel:  Recent Labs Lab 06/20/16 2019 06/21/16 0404 06/22/16 0441 06/23/16 0615  NA 138 139 136 135  K 4.6 4.3 4.1 4.6  CL 104 103 104 103  CO2 27 28 25 25   GLUCOSE 122* 100* 98 127*  BUN 20 21* 22* 25*  CREATININE 1.29* 1.25* 1.30* 1.20  CALCIUM 8.8* 8.6* 8.2* 8.1*   Liver Function Tests:  Recent Labs Lab 06/21/16 0404  AST 36  ALT 18  ALKPHOS 119  BILITOT 0.6  PROT 5.5*  ALBUMIN 2.8*   Sepsis Labs: No results for input(s): PROCALCITON, LATICACIDVEN in the last 168 hours.  Recent Results (from the past 240 hour(s))  Surgical pcr screen     Status: None   Collection Time: 06/21/16  9:11 PM  Result Value Ref Range Status   MRSA, PCR NEGATIVE NEGATIVE Final   Staphylococcus aureus NEGATIVE NEGATIVE Final    Radiology Studies: Dg Tibia/fibula Left  Result Date: 06/22/2016 CLINICAL DATA:  Post left tibial fracture repair EXAM: DG C-ARM 61-120 MIN; LEFT TIBIA AND FIBULA - 2 VIEW COMPARISON:  06/20/2016 FINDINGS: Seven views of the left tibia and fibula submitted. The patient is status post intraoperative repair of proximal left tibial fracture. There is intra medullary rod and 3 metallic fixation screws are noted in proximal left tibia. Two metallic fixation screws are noted in and distal tibia. There is improvement in alignment. Again noted minimal displaced fracture in proximal  left fibula with improvement in alignment. IMPRESSION: The patient is status post intraoperative repair of proximal left tibial fracture. There is intra medullary rod and 3 metallic fixation screws are noted in proximal left tibia. Two metallic fixation screws are noted in and distal tibia. There is improvement in alignment. Again noted minimal displaced fracture in proximal left fibula with improvement in alignment. Fluoroscopy time was 2 minutes 25 seconds. Please see the operative report. Electronically Signed   By: Lahoma Crocker M.D.   On: 06/22/2016 13:32   Dg Tibia/fibula Left Port  Result Date: 06/22/2016 CLINICAL DATA:  Status post ORIF of tibial fracture EXAM: PORTABLE LEFT TIBIA AND FIBULA - 2 VIEW COMPARISON:  06/20/2016 FINDINGS: Postsurgical changes are again seen in the distal femur stable from prior exam. A new tibial medullary rod with proximal and distal fixation screws is noted. The fracture fragments are in near anatomic alignment. IMPRESSION: Status post ORIF of tibial fracture. Electronically Signed  By: Inez Catalina M.D.   On: 06/22/2016 12:47   Dg C-arm 1-60 Min  Result Date: 06/22/2016 CLINICAL DATA:  Post left tibial fracture repair EXAM: DG C-ARM 61-120 MIN; LEFT TIBIA AND FIBULA - 2 VIEW COMPARISON:  06/20/2016 FINDINGS: Seven views of the left tibia and fibula submitted. The patient is status post intraoperative repair of proximal left tibial fracture. There is intra medullary rod and 3 metallic fixation screws are noted in proximal left tibia. Two metallic fixation screws are noted in and distal tibia. There is improvement in alignment. Again noted minimal displaced fracture in proximal left fibula with improvement in alignment. IMPRESSION: The patient is status post intraoperative repair of proximal left tibial fracture. There is intra medullary rod and 3 metallic fixation screws are noted in proximal left tibia. Two metallic fixation screws are noted in and distal tibia. There  is improvement in alignment. Again noted minimal displaced fracture in proximal left fibula with improvement in alignment. Fluoroscopy time was 2 minutes 25 seconds. Please see the operative report. Electronically Signed   By: Lahoma Crocker M.D.   On: 06/22/2016 13:32    Scheduled Meds: . calcium carbonate  2 tablet Oral Daily  . cholecalciferol  1,000 Units Oral Daily  . feeding supplement (ENSURE ENLIVE)  237 mL Oral Daily  . Melatonin  3 mg Oral QHS  . multivitamin with minerals  1 tablet Oral Daily  . pantoprazole  40 mg Oral BID  . Rivaroxaban  15 mg Oral Daily   Continuous Infusions: . sodium chloride 50 mL/hr at 06/23/16 0500  . lactated ringers       LOS: 3 days   Faye Ramsay, MD [facility] Pager [phone]  If 7PM-7AM, please contact night-coverage [email or web address] Password [facility] 06/23/2016, 12:24 PM

## 2016-06-23 NOTE — Clinical Social Work Note (Signed)
Clinical Social Work Assessment  Patient Details  Name: Sean [name] MRN: [id number] Date of Birth: 03-Dec-1924  Date of referral:  06/23/16               Reason for consult:  Facility Placement                Permission sought to share information with:  Chartered certified accountant granted to share information::  Yes, Verbal Permission Granted  Name::     Web designer::  SNFs  Relationship::  wife  Contact Information:     Housing/Transportation Living arrangements for the past 2 months:  Single Family Home Source of Information:  Patient Patient Interpreter Needed:  None Criminal Activity/Legal Involvement Pertinent to Current Situation/Hospitalization:  No - Comment as needed Significant Relationships:  Spouse, Adult Children Lives with:  Spouse Do you feel safe going back to the place where you live?  No Need for family participation in patient care:  No (Coment)  Care giving concerns:  Pt lives at home with elderly wife who cannot provide sufficient care for pt with current level of impairment.   Social Worker assessment / plan:  CSW spoke with pt about recommendation for SNF- pt states he knows several people who have been to SNF and understands what it entails.   Employment status:  Retired Forensic scientist:  Medicare PT Recommendations:  [place] / Referral to community resources:  [place]  Patient/Family's Response to care:  Agreeable to SNF placement for short term rehab.  Patient/Family's Understanding of and Emotional Response to Diagnosis, Current Treatment, and Prognosis:  No questions or concerns but hopeful for quick recovery so he can return to his wife soon.  Emotional Assessment Appearance:  Appears stated age Attitude/Demeanor/Rapport:    Affect (typically observed):  Appropriate, Pleasant Orientation:  Oriented to Self, Oriented to Place, Oriented to  Time, Oriented to  Situation Alcohol / Substance use:  Not Applicable Psych involvement (Current and /or in the community):  No (Comment)  Discharge Needs  Concerns to be addressed:  Care Coordination Readmission within the last 30 days:  No Current discharge risk:  Physical Impairment Barriers to Discharge:  Continued Medical Work up   Jorge Ny, LCSW 06/23/2016, 4:34 PM

## 2016-06-23 NOTE — Evaluation (Signed)
Occupational Therapy Evaluation Patient Details Name: Sean [name] MRN: [id number] DOB: Jul 20, 1925 Today's Date: 06/23/2016    History of Present Illness Pt is an 82 y.o. male who sustained a L tibia fracture due to a mechanical fall at home while bending down to get grapes out of his refrigerator. He is now s/p L tibia closed reduction and intramedullary nailing and closed treatment of L fibular shaft fracture with manipulation.   Clinical Impression   PTA, pt reports independence with assistive devices for ADL and functional mobility and was using a rollator, Rw, and cane for functional mobility. Pt reports that he uses rollator in his home but this does not fit through his bathroom door, so he uses a cane in the bathroom. He uses a RW for short distance community mobility but rollator for long distance community mobility. Pt currently requires mod assist for functional bed mobility to sit at EOB for ADL and single UE support with min guard assist to maintain balance during seated ADL. Limited to ADL at EOB this session as pt unable to attempt transfer at this time. Pt's HR monitor was connected but not turned on when OT arrived. When reconnected following session, HR monitor beeping and RN notified. Pt lives with his wife who is at home with him 24 hours/day but is able to provide only limited physical assistance as she has difficulty with mobility as well. Pt would benefit from SNF placement for continued rehabilitation services in order to maximize independence with ADL and ensure a safe D/C home. OT will continue to follow acutely.    Follow Up Recommendations  SNF    Equipment Recommendations  Other (comment) (TBD at next venue of care)    Recommendations for Other Services       Precautions / Restrictions Precautions Precautions: Fall Precaution Comments: Multiple recent falls Required Braces or Orthoses: Knee Immobilizer - Left Knee Immobilizer - Left: On except when in  CPM;Other (comment) (Pt wearing boot on OT arrival and no KI in room.) Restrictions Weight Bearing Restrictions: Yes LLE Weight Bearing: Non weight bearing      Mobility Bed Mobility Overal bed mobility: Needs Assistance Bed Mobility: Supine to Sit;Sit to Supine     Supine to sit: Mod assist Sit to supine: Mod assist      Transfers                 General transfer comment: Pt unable to attempt transfer at this time and feel would benefit from +2 assist for sit<>stand/ambulation due to poor balance.     Balance Overall balance assessment: Needs assistance Sitting-balance support: Single extremity supported;Feet supported Sitting balance-Leahy Scale: Poor Sitting balance - Comments: Required single UE support and min guard assist to maintain sitting balance for ADL.                                    ADL Overall ADL's : Needs assistance/impaired     Grooming: Min guard;Sitting Grooming Details (indicate cue type and reason): Min guard to maintain balance while sitting EOB. Also required single UE support. Upper Body Bathing: Minimal assitance;Sitting   Lower Body Bathing: Moderate assistance;Sitting/lateral leans   Upper Body Dressing : Minimal assistance;Sitting   Lower Body Dressing: Moderate assistance;Sitting/lateral leans                 General ADL Comments: Pt unable to attempt transfer at this time and  feel would benefit from +2 assist for sit<>stand/ambulation due to poor balance. Educated pt on NWB precautions, benefits of ice for pain management, ADL post-operatively, and fall prevention strategies. Pt unable/unwilling to attempt transfer this date but was willing to complete bed mobility and ADL while seated at EOB.     Vision Vision Assessment?: No apparent visual deficits   Perception     Praxis      Pertinent Vitals/Pain Pain Assessment: 0-10 Pain Score: 5  Pain Location: L knee Pain Descriptors / Indicators:  Aching;Discomfort;Sore Pain Intervention(s): Limited activity within patient's tolerance;Monitored during session;Ice applied;Repositioned     Hand Dominance Right   Extremity/Trunk Assessment Upper Extremity Assessment Upper Extremity Assessment: Generalized weakness   Lower Extremity Assessment Lower Extremity Assessment: LLE deficits/detail;Generalized weakness LLE Deficits / Details: Decreased strength and ROM as expected post-operatively.       Communication Communication Communication: HOH   Cognition Arousal/Alertness: Awake/alert (Requires increased processing time) Behavior During Therapy: WFL for tasks assessed/performed Overall Cognitive Status: No family/caregiver present to determine baseline cognitive functioning (Seems WFL but requires increased processing time.)                     General Comments       Exercises       Shoulder Instructions      Home Living Family/patient expects to be discharged to:: Private residence Living Arrangements: Spouse/significant other (Wife able to provide only limited physical assistance) Available Help at Discharge: Family;Available 24 hours/day (Wife with decreased use of leg and unable to lift pt) Type of Home: House Home Access: Stairs to enter [employer] of Steps: 3 Entrance Stairs-Rails: Right;Left;Can reach both Home Layout: One level     Bathroom Shower/Tub: Tub/shower unit Shower/tub characteristics: Architectural technologist: Standard Bathroom Accessibility: No (Uses cane in bathroom as RW does not fit)   Home Equipment: Shower seat;Hand held Tourist information centre manager - 2 wheels;Cane - quad;Grab bars - toilet   Additional Comments: RW will not fit through bathroom door       Prior Functioning/Environment Level of Independence: Independent with assistive device(s)        Comments: Using RW and cane for functional mobility. Reports using 2L O2 at home.        OT Problem List: Decreased  strength;Decreased range of motion;Decreased activity tolerance;Impaired balance (sitting and/or standing);Decreased safety awareness;Decreased knowledge of use of DME or AE;Decreased knowledge of precautions;Pain   OT Treatment/Interventions: Self-care/ADL training;Therapeutic exercise;Energy conservation;Balance training;Patient/family education;Therapeutic activities    OT Goals(Current goals can be found in the care plan section) Acute Rehab OT Goals Patient Stated Goal: to go home OT Goal Formulation: With patient Time For Goal Achievement: 07/07/16 Potential to Achieve Goals: Good ADL Goals Pt Will Perform Upper Body Bathing: with supervision;sitting Pt Will Perform Lower Body Bathing: with supervision;sit to/from stand Pt Will Perform Upper Body Dressing: with supervision;sitting Pt Will Perform Lower Body Dressing: with supervision;sit to/from stand Pt Will Perform Toileting - Clothing Manipulation and hygiene: with supervision;sit to/from stand Pt Will Perform Tub/Shower Transfer: with supervision;shower seat;Tub transfer;ambulating;rolling walker  OT Frequency: Min 1X/week   Barriers to D/C:            Co-evaluation              End of Session Equipment Utilized During Treatment: Oxygen (2LO2) Nurse Communication: Other (comment) (HR monitor needs attention)  Activity Tolerance: Patient tolerated treatment well Patient left: in bed;with bed alarm set;with call bell/phone within reach   Time:  [id number] OT Time Calculation (min): 43 min Charges:  OT General Charges $OT Visit: 1 Procedure OT Evaluation $OT Eval Moderate Complexity: 1 Procedure OT Treatments $Self Care/Home Management : 23-37 mins  Norman Herrlich, OTR/L [id number] 06/23/2016, 9:16 AM

## 2016-06-23 NOTE — Progress Notes (Signed)
Physical Therapy Evaluation Patient Details Name: Sean [name] MRN: [id number] DOB: Mar 14, 1925 Today's Date: 06/23/2016   History of Present Illness  Pt is an 82 y.o. male who sustained a L tibia fracture due to a mechanical fall at home while bending down to get grapes out of his refrigerator. He is now s/p L tibia closed reduction and intramedullary nailing and closed treatment of L fibular shaft fracture with manipulation.    Clinical Impression  Pt demonstrates decreased mobility requiring mod assist +2 to get EOB and to chair with RW. Pt wife is present at home but would not be able to physically assist pt with transfers or ambulation. Pt would be a good candidate for SNF placement in order to further progress independence with mobility, strength, and activity tolerance.     Follow Up Recommendations SNF    Equipment Recommendations  None recommended by PT    Recommendations for Other Services       Precautions / Restrictions Precautions Precautions: Fall Precaution Comments: Multiple recent falls Required Braces or Orthoses: Knee Immobilizer - Left Knee Immobilizer - Left: On except when in CPM;Other (comment) (Pt wearing boot on PT arrival- KI arrived after treatment) Restrictions Weight Bearing Restrictions: Yes LLE Weight Bearing: Non weight bearing      Mobility  Bed Mobility Overal bed mobility: Needs Assistance Bed Mobility: Supine to Sit     Supine to sit: Mod assist;+2 for physical assistance     General bed mobility comments: Pt required mod assist to lift B LEs to EOB; limited 2/2 pain. Verbal cues for hand placement. Pt requried mod assist to lift trunk from side lying to sit. HOB elevated  Transfers Overall transfer level: Needs assistance Equipment used: Rolling walker (2 wheeled) Transfers: Sit to/from [facility] Sit to Stand: Min assist;+2 safety/equipment Stand pivot transfers: Min assist;+2 safety/equipment       General  transfer comment: Pt able to stand with moderate verbal cues for hand and LE placement. Min assist for balance and slight support. Bed elevated.   Ambulation/Gait                Stairs            Wheelchair Mobility    Modified Rankin (Stroke Patients Only)       Balance Overall balance assessment: Needs assistance Sitting-balance support: Bilateral upper extremity supported;Feet supported Sitting balance-Leahy Scale: Poor Sitting balance - Comments: Required single UE support while sitting EOB   Standing balance support: Bilateral upper extremity supported Standing balance-Leahy Scale: Poor Standing balance comment: Pt required min assist to maintain balance with RW. Pt limited by NWB status.                              Pertinent Vitals/Pain Pain Assessment: 0-10 Pain Score: 5  Pain Location: L knee Pain Descriptors / Indicators: Aching;Discomfort;Sore Pain Intervention(s): Limited activity within patient's tolerance;Monitored during session;Ice applied;Repositioned    Home Living Family/patient expects to be discharged to:: Private residence Living Arrangements: Spouse/significant other Available Help at Discharge: Family;Available 24 hours/day (wife unable to lift PT and decreased use of leg) Type of Home: House Home Access: Stairs to enter Entrance Stairs-Rails: Right;Left;Can reach both Entrance Stairs-Number of Steps: 3 Home Layout: One level Home Equipment: Shower seat;Hand held Tourist information centre manager - 2 wheels;Cane - quad;Grab bars - toilet      Prior Function Level of Independence: Independent with assistive device(s)  Hand Dominance   Dominant Hand: Right    Extremity/Trunk Assessment   Upper Extremity Assessment: Defer to OT evaluation           Lower Extremity Assessment: LLE deficits/detail;Generalized weakness   LLE Deficits / Details: Decreased strength and ROM as expected post-operatively.      Communication   Communication: HOH  Cognition Arousal/Alertness: Awake/alert Behavior During Therapy: WFL for tasks assessed/performed Overall Cognitive Status: Within Functional Limits for tasks assessed                      General Comments      Exercises     Assessment/Plan    PT Assessment Patient needs continued PT services  PT Problem List Decreased strength;Decreased activity tolerance;Decreased balance;Decreased mobility;Decreased knowledge of use of DME;Decreased safety awareness;Pain          PT Treatment Interventions      PT Goals (Current goals can be found in the Care Plan section)  Acute Rehab PT Goals Patient Stated Goal: to go home PT Goal Formulation: With patient Time For Goal Achievement: 07/07/16 Potential to Achieve Goals: Fair    Frequency Min 3X/week   Barriers to discharge Decreased caregiver support wife is present but not able to physically assist pt at home    Co-evaluation               End of Session Equipment Utilized During Treatment: Gait belt Activity Tolerance: Patient tolerated treatment well Patient left: in chair;with call bell/phone within reach;with family/visitor present;with SCD's reapplied Nurse Communication: Mobility status         Time: [id number] PT Time Calculation (min) (ACUTE ONLY): 28 min   Charges:   PT Evaluation $PT Eval Moderate Complexity: 1 Procedure PT Treatments $Therapeutic Activity: 8-22 mins   PT G Codes:        Tonia Brooms 07/01/16, 3:56 PM Tonia Brooms, SPT [phone]

## 2016-06-23 NOTE — NC FL2 (Signed)
[place] LEVEL OF CARE SCREENING TOOL     IDENTIFICATION  Patient Name: Sean [name] Birthdate: 09/11/1924 Sex: male Admission Date (Current Location): 06/20/2016  [facility] and [place] Number:  Herbalist and Address:  [facility], [place] [address] [place], [place] [address]      Provider Number: [id number]  Attending Physician Name and Address:  Theodis Blaze, MD  Relative Name and Phone Number:       Current Level of Care: Hospital Recommended Level of Care: [place] Prior Approval Number:    Date Approved/Denied:   PASRR Number: [id number]  Discharge Plan: SNF    Current Diagnoses: Patient Active Problem List   Diagnosis Date Noted  . Anemia of chronic disease 06/21/2016  . Chronic anticoagulation 06/21/2016  . Closed tibia fracture 06/20/2016  . Closed tibial fracture 06/20/2016  . Cardiomyopathy ([place]) 03/11/2016  . Aortic regurgitation 03/11/2016  . Atrial fibrillation ([place]) 02/27/2016  . Chest pain 08/09/2014  . Back pain 02/01/2013  . Grief reaction 02/01/2013  . Chronic pain 01/30/2012  . Bladder neck obstruction 12/25/2011  . Weight loss 10/10/2011  . Ecchymosis 06/18/2011  . Peripheral neuropathy ([place]) 12/16/2010  . Impaired glucose tolerance 12/13/2010  . Preventative health care 12/13/2010  . LOW BACK PAIN, CHRONIC 12/17/2009  . CONSTIPATION 08/09/2009  . COMPRESSION FRACTURE, LUMBAR VERTEBRAE 08/09/2009  . CAROTID ARTERY DISEASE 07/23/2009  . OSTEOARTHRITIS, KNEES, BILATERAL 06/19/2009  . UNSPECIFIED SUDDEN HEARING LOSS 03/20/2009  . VITAMIN D DEFICIENCY 09/18/2008  . COPD (chronic obstructive pulmonary disease) ([place]) 09/18/2008  . CKD (chronic kidney disease) 09/18/2008  . PSA, INCREASED 09/18/2008  . ECHOCARDIOGRAM, ABNORMAL 07/06/2008  . BRADYCARDIA 12/17/2007  . Fatigue 06/18/2007  . BLADDER CANCER 06/17/2007  . Hyperlipemia 06/17/2007  . ABUSE, ALCOHOL, IN REMISSION  06/17/2007  . Essential hypertension 06/17/2007  . GERD 06/17/2007  . PEPTIC ULCER DISEASE 06/17/2007  . DIVERTICULOSIS, COLON 06/17/2007  . Alcoholic cirrhosis of liver ([place]) 06/17/2007  . OSTEOPOROSIS 06/17/2007  . COLONIC POLYPS, HX OF 06/17/2007    Orientation RESPIRATION BLADDER Height & Weight     Self, Time, Situation, Place  O2 (2L [place]) Continent Weight: 92 lb 11.2 oz (42 kg) Height:  5\' 10"  (177.8 cm)  BEHAVIORAL SYMPTOMS/MOOD NEUROLOGICAL BOWEL NUTRITION STATUS      Continent Diet (see DC)  AMBULATORY STATUS COMMUNICATION OF NEEDS Skin   Extensive Assist Verbally Surgical wounds                       Personal Care Assistance Level of Assistance  Bathing, Dressing Bathing Assistance: Maximum assistance   Dressing Assistance: Maximum assistance     Functional Limitations Info             SPECIAL CARE FACTORS FREQUENCY  PT (By licensed PT), OT (By licensed OT)     PT Frequency: 5/wk OT Frequency: 5/wk            Contractures      Additional Factors Info  Code Status, Allergies Code Status Info: FULL Allergies Info: Amoxicillin-pot Clavulanate, Ibuprofen           Current Medications (06/23/2016):  This is the current hospital active medication list Current Facility-Administered Medications  Medication Dose Route Frequency Provider Last Rate Last Dose  . acetaminophen (TYLENOL) tablet 650 mg  650 mg Oral Q6H PRN Norval Morton, MD   650 mg at 06/21/16 1505   Or  .  acetaminophen (TYLENOL) suppository 650 mg  650 mg Rectal Q6H PRN Norval Morton, MD      . alum & mag hydroxide-simeth (MAALOX/MYLANTA) 200-200-20 MG/5ML suspension 30 mL  30 mL Oral PRN Modena Jansky, MD   30 mL at 06/23/16 0631  . calcium carbonate (TUMS - dosed in mg elemental calcium) chewable tablet 400 mg of elemental calcium  2 tablet Oral Daily Rondell A Tamala Julian, MD   400 mg of elemental calcium at 06/23/16 1011  . cholecalciferol (VITAMIN D) tablet 1,000 Units  1,000  Units Oral Daily Norval Morton, MD   1,000 Units at 06/23/16 1011  . feeding supplement (ENSURE ENLIVE) (ENSURE ENLIVE) liquid 237 mL  237 mL Oral Daily Norval Morton, MD   237 mL at 06/23/16 1011  . fentaNYL (SUBLIMAZE) injection 25 mcg  25 mcg Intravenous Q2H PRN Norval Morton, MD   25 mcg at 06/22/16 0801  . HYDROcodone-acetaminophen (NORCO/VICODIN) 5-325 MG per tablet 1-2 tablet  1-2 tablet Oral Q6H PRN Leandrew Koyanagi, MD   2 tablet at 06/23/16 1419  . ipratropium-albuterol (DUONEB) 0.5-2.5 (3) MG/3ML nebulizer solution 3 mL  3 mL Nebulization Q2H PRN Norval Morton, MD      . Melatonin TABS 3 mg  3 mg Oral QHS Rondell A Tamala Julian, MD   3 mg at 06/22/16 2200  . menthol-cetylpyridinium (CEPACOL) lozenge 3 mg  1 lozenge Oral PRN Naiping Ephriam Jenkins, MD       Or  . phenol (CHLORASEPTIC) mouth spray 1 spray  1 spray Mouth/Throat PRN Naiping Ephriam Jenkins, MD      . methocarbamol (ROBAXIN) tablet 500 mg  500 mg Oral Q6H PRN Leandrew Koyanagi, MD   500 mg at 06/23/16 1419   Or  . methocarbamol (ROBAXIN) 500 mg in dextrose 5 % 50 mL IVPB  500 mg Intravenous Q6H PRN Naiping Ephriam Jenkins, MD      . metoCLOPramide (REGLAN) tablet 5-10 mg  5-10 mg Oral Q8H PRN Naiping Ephriam Jenkins, MD       Or  . metoCLOPramide (REGLAN) injection 5-10 mg  5-10 mg Intravenous Q8H PRN Leandrew Koyanagi, MD      . multivitamin with minerals tablet 1 tablet  1 tablet Oral Daily Norval Morton, MD   1 tablet at 06/23/16 1011  . ondansetron (ZOFRAN) tablet 4 mg  4 mg Oral Q6H PRN Norval Morton, MD       Or  . ondansetron (ZOFRAN) injection 4 mg  4 mg Intravenous Q6H PRN Rondell A Tamala Julian, MD      . pantoprazole (PROTONIX) EC tablet 40 mg  40 mg Oral BID Norval Morton, MD   40 mg at 06/23/16 1011  . Rivaroxaban (XARELTO) tablet 15 mg  15 mg Oral Daily Naiping Ephriam Jenkins, MD   15 mg at 06/23/16 1011     Discharge Medications: Please see discharge summary for a list of discharge medications.  Relevant Imaging Results:  Relevant Lab Results:   Additional  Information SS#: [id number]  Jorge Ny, LCSW

## 2016-06-24 LAB — CBC
HEMATOCRIT: 21.8 % — AB (ref 39.0–52.0)
Hemoglobin: 7.3 g/dL — ABNORMAL LOW (ref 13.0–17.0)
MCH: 32.3 pg (ref 26.0–34.0)
MCHC: 33.5 g/dL (ref 30.0–36.0)
MCV: 96.5 fL (ref 78.0–100.0)
PLATELETS: 95 10*3/uL — AB (ref 150–400)
RBC: 2.26 MIL/uL — AB (ref 4.22–5.81)
RDW: 15.1 % (ref 11.5–15.5)
WBC: 7.1 10*3/uL (ref 4.0–10.5)

## 2016-06-24 LAB — BASIC METABOLIC PANEL
ANION GAP: 6 (ref 5–15)
BUN: 25 mg/dL — AB (ref 6–20)
CO2: 25 mmol/L (ref 22–32)
Calcium: 8 mg/dL — ABNORMAL LOW (ref 8.9–10.3)
Chloride: 102 mmol/L (ref 101–111)
Creatinine, Ser: 1.16 mg/dL (ref 0.61–1.24)
GFR calc Af Amer: >60 mL/min (ref >60)
GFR calc non Af Amer: 54 mL/min — ABNORMAL LOW (ref >60)
GLUCOSE: 112 mg/dL — AB (ref 65–99)
POTASSIUM: 4.1 mmol/L (ref 3.5–5.1)
Sodium: 133 mmol/L — ABNORMAL LOW (ref 135–145)

## 2016-06-24 LAB — PREPARE RBC (CROSSMATCH)

## 2016-06-24 LAB — HEMOGLOBIN AND HEMATOCRIT, BLOOD
HCT: 27.7 % — ABNORMAL LOW (ref 39.0–52.0)
Hemoglobin: 9.4 g/dL — ABNORMAL LOW (ref 13.0–17.0)

## 2016-06-24 LAB — ABO/RH: ABO/RH(D): O POS

## 2016-06-24 MED ORDER — SODIUM CHLORIDE 0.9 % IV SOLN
Freq: Once | INTRAVENOUS | Status: AC
  Administered 2016-06-24: 11:00:00 via INTRAVENOUS

## 2016-06-24 MED ORDER — OXYCODONE HCL 5 MG PO TABS: 5.0000 mg | ORAL_TABLET | Freq: Four times a day (QID) | ORAL | 0 refills | Status: DC | PRN

## 2016-06-24 NOTE — Plan of Care (Signed)
Problem: Pain Managment: Goal: General experience of comfort will improve Outcome: Progressing Medicated once for pain with moderate relief  Problem: Physical Regulation: Goal: Will remain free from infection Outcome: Progressing No s/s of infection noted  Problem: Skin Integrity: Goal: Risk for impaired skin integrity will decrease Outcome: Progressing Turned and repositioned q2hrs  Problem: Tissue Perfusion: Goal: Risk factors for ineffective tissue perfusion will decrease Outcome: Progressing No s/s of dvt  Problem: Bowel/Gastric: Goal: Will not experience complications related to bowel motility Outcome: Not Progressing No BM since 11/24, note will be left for MD to order stool softner, denies N/V

## 2016-06-24 NOTE — Consult Note (Signed)
            [facility] CM Primary Care Navigator  06/24/2016  MYSHAWN [name] 03-19-1925 [id number]   Seen patient and wife Joaquim Lai) at the bedside to identify possible discharge needs.  Patient stated that he was bending down to get something out of the refrigerator when he lost his balance and fell sitting to the floor and heard a "pop"; he sustained a tibial fracture which had led to this admission/ surgery. Patient endorses Dr. Cathlean Cower with [facility] [place] as the primary care provider.   Patient shared using [facility] to obtain medications without problem for now.Marland Kitchen  He manages medications at home with wife's assistance and taking straight out of the containers as stated.  Patient was still driving prior to admission. Wife or son Harrie Jeans) will provide transportation to his doctors' appointments when he gets home.  Patient lives with wife who is at home with him 24 hours/day but is able to provide only limited physical assistance as she has difficulty with mobility as well. Patient has daughter in-law and granddaughters who can assist as needed.  Plan for discharge is skilled nursing facility [facility]) for continued rehabilitation services in order to maximize independence with ADL's and to ensure a safer discharge to home.    Patient and wife voiced understanding to call primary care provider's office once he gets home, for a post discharge follow-up appointment within a week or sooner if needed. Patient letter provided for a reminder.  Patient and wife voiced no further concerns or issues at this time and denied any need for care coordination and  disease management at present. [facility] care management contact information provided in case future needs arise.   For additional questions please contact:  Edwena Felty A. Davian Wollenberg, BSN, RN-BC [facility] PRIMARY CARE Navigator Cell: [phone]

## 2016-06-24 NOTE — Progress Notes (Signed)
Physical Therapy Treatment Patient Details Name: Sean [name] MRN: [id number] DOB: [birth year] Today's Date: 06/24/2016    History of Present Illness Pt is an 82 y.o. male who sustained a L tibia fracture due to a mechanical fall at home while bending down to get grapes out of his refrigerator. He is now s/p L tibia closed reduction and intramedullary nailing and closed treatment of L fibular shaft fracture with manipulation.    PT Comments    Pt performed bed mobility and standing trial.  Able to maintain static stand with +2 min assist for 2 mins.  Pt fatigued after trial.  Returned to bed for comfort, continue to recommend SNF placement.    Follow Up Recommendations  SNF     Equipment Recommendations  None recommended by PT    Recommendations for Other Services       Precautions / Restrictions Precautions Precautions: Fall Precaution Comments: Multiple recent falls Required Braces or Orthoses: Knee Immobilizer - Left Knee Immobilizer - Left: On except when in CPM;Other (comment) (R cam boot on, on arrival.  ) Restrictions Weight Bearing Restrictions: Yes LLE Weight Bearing: Non weight bearing    Mobility  Bed Mobility Overal bed mobility: Needs Assistance Bed Mobility: Supine to Sit     Supine to sit: Mod assist Sit to supine: Mod assist   General bed mobility comments: Pt required assist for B LEs into and out of bed.  Pt with poor trunk control and required assist to steady his balance.  pt sat edge of bed x 15 min.    Transfers Overall transfer level: Needs assistance Equipment used: Rolling walker (2 wheeled) Transfers: Sit to/from Stand Sit to Stand: Mod assist;+2 physical assistance         General transfer comment: Pt from elevated surface required +2 mod to boost into standing and maintain LLE TDWB.    Ambulation/Gait Ambulation/Gait assistance:  (Unable due to innability to maintain weight bearing without assistance. )                Stairs            Wheelchair Mobility    Modified Rankin (Stroke Patients Only)       Balance                                    Cognition Arousal/Alertness: Awake/alert Behavior During Therapy: WFL for tasks assessed/performed Overall Cognitive Status: Within Functional Limits for tasks assessed                      Exercises      General Comments        Pertinent Vitals/Pain Pain Assessment: 0-10 Pain Score: 10-Worst pain ever Pain Location: L knee Pain Descriptors / Indicators: Aching;Sore;Discomfort Pain Intervention(s): Monitored during session;Repositioned    Home Living                      Prior Function            PT Goals (current goals can now be found in the care plan section) Acute Rehab PT Goals Patient Stated Goal: to go home Potential to Achieve Goals: Fair Progress towards PT goals: Progressing toward goals    Frequency    Min 3X/week      PT Plan Current plan remains appropriate    Co-evaluation  End of Session Equipment Utilized During Treatment: Gait belt Activity Tolerance: Patient tolerated treatment well Patient left: with call bell/phone within reach;with family/visitor present;in bed;with bed alarm set     Time: [id number] PT Time Calculation (min) (ACUTE ONLY): 30 min  Charges:  $Therapeutic Activity: 23-37 mins                    G Codes:      Cristela Blue July 07, 2016, 5:18 PM  Governor Rooks, PTA pager [phone]

## 2016-06-24 NOTE — Discharge Summary (Addendum)
Physician Discharge Summary  Sean [name] [id number] DOB: 01-31-1925 DOA: 06/20/2016  PCP: Cathlean Cower, MD  Admit date: 06/20/2016 Discharge date: 06/25/2016  Recommendations for Outpatient Follow-up:  1. Pt will need to follow up with PCP in 1 week post discharge 2. Please obtain BMP to evaluate electrolytes and kidney function 3. Please also check CBC to evaluate Hg and Hct levels on 12/1 4. Please note That patient is on Xarelto which is being held- can be continued provided no evidence of bleeding, hemoglobin stable (please repeat H/H on Friday 12/1) instead of aspirin with understanding that patient will need to follow up with his cardiology for further risk vs benefit of xarelto given recent falls 5. Patient needs close monitoring while on anticoagulation, this can be addressed by primary care physician whether or not anticoagulation to be continued based on risks versus benefits  Discharge Diagnoses:  Principal Problem:   Closed tibia fracture Active Problems:   COPD (chronic obstructive pulmonary disease) (HCC)  Discharge Condition: Stable  Diet recommendation: Heart healthy diet discussed in details   Brief Narrative:  82 year old male, married, lives with spouse, PMH of HTN, HLD, A. fib on Xarelto, PVD, COPD, GERD, alcoholic cirrhosis, visual impairment, hard of hearing, severe peripheral neuropathy, presented to [facility] following a mechanical fall at home and sustained left tibial fracture. He stated that he was bending down to get something out of the refrigerator when he lost his footing and sat to the floor and heard a pop. Family gives history of at least 3-4 falls within the last month. He continues to drive. Orthopedics have seen and underwent surgery on 11/26.  Assessment & Plan:  1. Left proximal tibial fracture: Sustained status post mechanical fall. Orthopedics consultation appreciated and underwent surgical fixation on 11/26. PT/OT eval done, SNF recommended,  bed available at [facility]. May be able to go in AM if Hg stable. Will transition to full strength aspirin daily and re- evaluate H/H on Friday 12/1 after discussion with Dr. Erlinda Hong. 2. A. fib: Recently saw his cardiologist on 06/18/16. CHA2DS2-VASc Score: 3. Last dose of Xarelto was on 06/19/16. Resumed Xarelto post op. Patient is not on rate control medications at home. Dr. Algis Liming discussed with primary cardiologist Dr. Allegra Lai on 11/26 and agreed that since patient will be in a monitored setting at [facility] post discharge however as H/H continued to decrease will hold at time of discharge.  Will repeat H/H on Friday 12/1 and re-evaluate if patient is appropriate for anticoagulation initiation.   3. Essential hypertension: Reasonable inpatient control off antihypertensives. 4. Frequent falls: Apparently has sustained 3-4 falls in the last month. Etiology is multifactorial secondary to advanced age, frail physical status, significant peripheral neuropathy, visual impairment, hard of hearing and multiple comorbidities. Since patient will be going to SNF postop, reasonable to continue Xarelto in closely monitored setting however due to decreasing H/H Xarelto will be held at time of discharge. Decision regarding continued long-term anticoagulation will need to be reassessed. Patient was counseled that it is not safe for him to drive anymore. 5. COPD: Stable without clinical bronchospasm. Maintaining oxygen saturations at target range.  6. Anemia/ABLA: Hemoglobin dropped from 10.6 on admission to 8.2 preop --> 7.3 this AM, needs once U PRBC transfusion and repeat CBC in AM. Can be discharged in AM if Hg stable. Ig Hg continues to drop, consider stopping Xarelto.  7. Acute kidney injury imposed on Stage III cCKD: Creatinine at baseline, WNL this AM.  8. GERD: Continue  PPI 9. Cardiomyopathy: Last echo 03/10/16 showed EF 50-55 percent. Clinically euvolemic. 10. Adult failure to thrive, underweight:  Multifactorial. Body mass index is 13.3 kg/m 11. Thrombocytopenia: Possibly related to acute blood loss. No indication for transfusion. CBC in AM  DVT prophylaxis: SCDs, Xarelto Code Status: Full Family Communication: Discussed with pt at bedside, no family in the room  Disposition Plan: DC to SNF in am if Hg stable   Consultants:   Orthopedics  Procedures:   Left tibial closed reduction and IM nailing & closed treatment of left fibular shaft fracture with manipulation 11/26.  Antimicrobials:   None   Procedures/Studies: Dg Chest 1 View  Result Date: 06/20/2016 CLINICAL DATA:  82 year old male status post fall today. Initial encounter. EXAM: CHEST 1 VIEW COMPARISON:  02/27/2016, levin 11/15/2007. FINDINGS: Right upper extremity artifact projecting over the diaphragm. Cardiac silhouette has increased on this PA view compared to August. Other mediastinal contours are within normal limits. Chronic hyperinflation. No pneumothorax, pulmonary edema, pleural effusion or confluent pulmonary opacity. EKG button artifact projecting in the right upper lung. Osteopenia. Previously augmented upper lumbar compression fracture. No acute osseous abnormality identified. IMPRESSION: 1. Cardiac silhouette appears mildly increased since August. Consider pericardial effusion. 2. No other acute cardiopulmonary abnormality. Chronic hyperinflation. 3. Right upper extremity artifact projecting over the diaphragm. Electronically Signed   By: Genevie Ann M.D.   On: 06/20/2016 21:10   Dg Knee 2 Views Left  Result Date: 06/20/2016 CLINICAL DATA:  Patient fell today. Swelling in the left knee. Lower leg feels numb. Not able to straighten knee. Severe pain. EXAM: LEFT KNEE - 1-2 VIEW COMPARISON:  None. FINDINGS: Diffuse bone demineralization. Postoperative changes with hardware fixation in the femoral metaphysis, incompletely included within the field of view. Oblique probably comminuted fractures of the left  proximal tibial meta diaphysis with about 7 mm lateral displacement of the distal fracture fragment. Alignment appears intact. Slight cortical irregularity at the junction of the proximal fibular head neck is incompletely visualized but could also represent fibular fracture. Soft tissue swelling. Prominent degenerative changes in the left knee with chondrocalcinosis and small osteochondral defect in the left medial femoral condyle. No significant effusion. Vascular calcifications. IMPRESSION: Diffuse bone demineralization with acute mildly displaced fracture of the proximal left tibia at the junction of the metaphysis and shaft. Probable nondisplaced fracture of the proximal left fibula. Electronically Signed   By: Lucienne Capers M.D.   On: 06/20/2016 21:10   Dg Tibia/fibula Left  Result Date: 06/22/2016 CLINICAL DATA:  Post left tibial fracture repair EXAM: DG C-ARM 61-120 MIN; LEFT TIBIA AND FIBULA - 2 VIEW COMPARISON:  06/20/2016 FINDINGS: Seven views of the left tibia and fibula submitted. The patient is status post intraoperative repair of proximal left tibial fracture. There is intra medullary rod and 3 metallic fixation screws are noted in proximal left tibia. Two metallic fixation screws are noted in and distal tibia. There is improvement in alignment. Again noted minimal displaced fracture in proximal left fibula with improvement in alignment. IMPRESSION: The patient is status post intraoperative repair of proximal left tibial fracture. There is intra medullary rod and 3 metallic fixation screws are noted in proximal left tibia. Two metallic fixation screws are noted in and distal tibia. There is improvement in alignment. Again noted minimal displaced fracture in proximal left fibula with improvement in alignment. Fluoroscopy time was 2 minutes 25 seconds. Please see the operative report. Electronically Signed   By: Lahoma Crocker M.D.   On: 06/22/2016 13:32  Dg Tibia/fibula Left  Result Date:  06/20/2016 CLINICAL DATA:  [place] today with swelling in the left knee. Pain and limited range of motion. EXAM: LEFT TIBIA AND FIBULA - 2 VIEW COMPARISON:  None. FINDINGS: Diffuse bone demineralization. Acute oblique fracture of the proximal left tibia at the junction of the metaphysis and shaft. There is about 7 mm lateral displacement of the distal fracture fragment. Alignment is intact. Midshaft and distal tibia appear intact. Limited visualization of the proximal fibula. Possible fracture of the proximal fibula is not well evaluated on this study. See additional report of the left knee. Degenerative changes in the left knee and left ankle. Vascular calcifications throughout. IMPRESSION: Oblique fracture of the proximal left tibia with mild lateral displacement of the distal fracture fragment. Electronically Signed   By: Lucienne Capers M.D.   On: 06/20/2016 21:13   Dg Tibia/fibula Left Port  Result Date: 06/22/2016 CLINICAL DATA:  Status post ORIF of tibial fracture EXAM: PORTABLE LEFT TIBIA AND FIBULA - 2 VIEW COMPARISON:  06/20/2016 FINDINGS: Postsurgical changes are again seen in the distal femur stable from prior exam. A new tibial medullary rod with proximal and distal fixation screws is noted. The fracture fragments are in near anatomic alignment. IMPRESSION: Status post ORIF of tibial fracture. Electronically Signed   By: Inez Catalina M.D.   On: 06/22/2016 12:47   Dg C-arm 1-60 Min  Result Date: 06/22/2016 CLINICAL DATA:  Post left tibial fracture repair EXAM: DG C-ARM 61-120 MIN; LEFT TIBIA AND FIBULA - 2 VIEW COMPARISON:  06/20/2016 FINDINGS: Seven views of the left tibia and fibula submitted. The patient is status post intraoperative repair of proximal left tibial fracture. There is intra medullary rod and 3 metallic fixation screws are noted in proximal left tibia. Two metallic fixation screws are noted in and distal tibia. There is improvement in alignment. Again noted minimal displaced  fracture in proximal left fibula with improvement in alignment. IMPRESSION: The patient is status post intraoperative repair of proximal left tibial fracture. There is intra medullary rod and 3 metallic fixation screws are noted in proximal left tibia. Two metallic fixation screws are noted in and distal tibia. There is improvement in alignment. Again noted minimal displaced fracture in proximal left fibula with improvement in alignment. Fluoroscopy time was 2 minutes 25 seconds. Please see the operative report. Electronically Signed   By: Lahoma Crocker M.D.   On: 06/22/2016 13:32   Discharge Exam: Vitals:   06/24/16 1954 06/25/16 0442  BP: (!) 107/50 (!) 121/47  Pulse: 73 81  Resp: 16 16  Temp: 98 F (36.7 C) 98.1 F (36.7 C)   Vitals:   06/24/16 1105 06/24/16 1320 06/24/16 1954 06/25/16 0442  BP: (!) 104/47 113/62 (!) 107/50 (!) 121/47  Pulse: 64 71 73 81  Resp: 18 18 16 16   Temp: 98.5 F (36.9 C) 98.6 F (37 C) 98 F (36.7 C) 98.1 F (36.7 C)  TempSrc: Oral Oral Oral Oral  SpO2: 97% 97% 97% 96%  Weight:      Height:        General: Pt is alert, follows commands appropriately, not in acute distress, cachectic Cardiovascular: Regular rate and rhythm, S1/S2 +, no rubs, no gallops Respiratory: Clear to auscultation bilaterally, no wheezing, no crackles, no rhonchi Abdominal: Soft, non tender, non distended, bowel sounds +, no guarding   Discharge Instructions  Discharge Instructions    Call MD for:  difficulty breathing, headache or visual disturbances    Complete by:  As directed    Call MD for:  extreme fatigue    Complete by:  As directed    Call MD for:  persistant dizziness or light-headedness    Complete by:  As directed    Call MD for:  persistant nausea and vomiting    Complete by:  As directed    Call MD for:  redness, tenderness, or signs of infection (pain, swelling, redness, odor or green/yellow discharge around incision site)    Complete by:  As directed     Call MD for:  severe uncontrolled pain    Complete by:  As directed    Call MD for:  temperature >100.4    Complete by:  As directed    Diet - low sodium heart healthy    Complete by:  As directed    Increase activity slowly    Complete by:  As directed        Medication List    STOP taking these medications   Rivaroxaban 15 MG Tabs tablet Commonly known as:  XARELTO     TAKE these medications   aspirin 325 MG tablet Take 1 tablet (325 mg total) by mouth daily.   calcium carbonate 500 MG chewable tablet Commonly known as:  TUMS - dosed in mg elemental calcium Chew 2 tablets by mouth daily.   cholecalciferol 1000 units tablet Commonly known as:  VITAMIN D Take 1,000 Units by mouth daily.   feeding supplement (ENSURE ENLIVE) Liqd Take 237 mLs by mouth daily.   MULTIVITAMINS PO Take 1 tablet by mouth daily.   omeprazole 20 MG capsule Commonly known as:  PRILOSEC Take 1 capsule (20 mg total) by mouth 2 (two) times daily before a meal.   oxyCODONE 5 MG immediate release tablet Commonly known as:  ROXICODONE Take 1 tablet (5 mg total) by mouth every 6 (six) hours as needed. What changed:  reasons to take this   senna 8.6 MG Tabs tablet Commonly known as:  SENOKOT Take 1 tablet (8.6 mg total) by mouth at bedtime as needed for mild constipation.       Contact information for follow-up providers    Eduard Roux, MD Follow up in 2 week(s).   Specialty:  Orthopedic Surgery Why:  For suture removal, For wound re-check Contact information: [place] [address] [phone]        Cathlean Cower, MD Follow up.   Specialties:  Internal Medicine, Radiology Contact information: [place] [address] [phone]            Contact information for after-discharge care    Destination    [facility] Follow up.   Specialty:  [place] information: [address] [place] [phone]                   The results of significant diagnostics from this hospitalization (including imaging, microbiology, ancillary and laboratory) are listed below for reference.     Microbiology: Recent Results (from the past 240 hour(s))  Surgical pcr screen     Status: None   Collection Time: 06/21/16  9:11 PM  Result Value Ref Range Status   MRSA, PCR NEGATIVE NEGATIVE Final   Staphylococcus aureus NEGATIVE NEGATIVE Final     Labs: Basic Metabolic Panel:  Recent Labs Lab 06/21/16 0404 06/22/16 0441 06/23/16 0615 06/24/16 0420 06/25/16 0355  NA 139 136 135 133* 134*  K  4.3 4.1 4.6 4.1 4.6  CL 103 104 103 102 101  CO2 28 25 25 25 26   GLUCOSE 100* 98 127* 112* 108*  BUN 21* 22* 25* 25* 25*  CREATININE 1.25* 1.30* 1.20 1.16 1.15  CALCIUM 8.6* 8.2* 8.1* 8.0* 8.2*   Liver Function Tests:  Recent Labs Lab 06/21/16 0404  AST 36  ALT 18  ALKPHOS 119  BILITOT 0.6  PROT 5.5*  ALBUMIN 2.8*   CBC:  Recent Labs Lab 06/20/16 2019 06/21/16 0404 06/22/16 0441 06/23/16 0615 06/24/16 0420 06/24/16 1457 06/25/16 0355 06/25/16 0928  WBC 9.5 7.8 6.7 8.6 7.1  --  7.1  --   NEUTROABS 7.0  --   --   --   --   --   --   --   HGB 10.6* 9.2* 8.2* 8.0* 7.3* 9.4* 8.9* 8.8*  HCT 32.5* 28.3* 24.9* 24.0* 21.8* 27.7* 26.3* 26.0*  MCV 98.8 99.0 98.0 97.6 96.5  --  95.6  --   PLT 190 164 121* 115* 95*  --  125*  --    BNP (last 3 results)  Recent Labs  06/20/16 2019  BNP 799.9*    SIGNED: Time coordinating discharge:  30 minutes  Loretha Stapler, MD  [facility] 06/25/2016, 2:37 PM Pager [phone]  If 7PM-7AM, please contact night-coverage [email or web address] Password [email or web address]     Medication List    STOP taking these medications   Rivaroxaban 15 MG Tabs tablet Commonly known as:  XARELTO     TAKE these medications   aspirin 325 MG tablet Take 1 tablet (325 mg total) by mouth daily.   calcium carbonate 500 MG chewable  tablet Commonly known as:  TUMS - dosed in mg elemental calcium Chew 2 tablets by mouth daily.   cholecalciferol 1000 units tablet Commonly known as:  VITAMIN D Take 1,000 Units by mouth daily.   feeding supplement (ENSURE ENLIVE) Liqd Take 237 mLs by mouth daily.   MULTIVITAMINS PO Take 1 tablet by mouth daily.   omeprazole 20 MG capsule Commonly known as:  PRILOSEC Take 1 capsule (20 mg total) by mouth 2 (two) times daily before a meal.   oxyCODONE 5 MG immediate release tablet Commonly known as:  ROXICODONE Take 1 tablet (5 mg total) by mouth every 6 (six) hours as needed. What changed:  reasons to take this   senna 8.6 MG Tabs tablet Commonly known as:  SENOKOT Take 1 tablet (8.6 mg total) by mouth at bedtime as needed for mild constipation.

## 2016-06-24 NOTE — Care Management Important Message (Signed)
Important Message  Patient Details  Name: Sean [name] MRN: [id number] Date of Birth: 26-May-1925   Medicare Important Message Given:  Yes    Nathen May 06/24/2016, 11:47 AM

## 2016-06-24 NOTE — Clinical Social Work Placement (Addendum)
   CLINICAL SOCIAL WORK PLACEMENT  NOTE  Date:  06/24/2016  Patient Details  Name: Sean [name] MRN: [id number] Date of Birth: [birth year]  Clinical Social Work is seeking post-discharge placement for this patient at the [place] level of care (*CSW will initial, date and re-position this form in  chart as items are completed):  Yes   Patient/family provided with [facility] Work Department's list of facilities offering this level of care within the geographic area requested by the patient (or if unable, by the patient's family).  Yes   Patient/family informed of their freedom to choose among providers that offer the needed level of care, that participate in Medicare, Medicaid or managed care program needed by the patient, have an available bed and are willing to accept the patient.  Yes   Patient/family informed of [facility]'s ownership interest in [facility] and [facility], as well as of the fact that they are under no obligation to receive care at these facilities.  PASRR submitted to EDS on 06/23/16     PASRR number received on 06/23/16     Existing PASRR number confirmed on       FL2 transmitted to all facilities in geographic area requested by pt/family on 06/23/16     FL2 transmitted to all facilities within larger geographic area on       Patient informed that his/her managed care company has contracts with or will negotiate with certain facilities, including the following:        Yes   Patient/family informed of bed offers received.  Patient chooses bed at [facility]     Physician recommends and patient chooses bed at      Patient to be transferred to [facility] on 06/25/16.  Patient to be transferred to facility by [facility]     Patient family notified on 06/25/16 of transfer.  Name of family member notified:        PHYSICIAN Please prepare priority discharge summary, including medications, Please prepare  prescriptions     Additional Comment:    _______________________________________________ Alla German, LCSW 06/24/2016, 10:24 AM

## 2016-06-25 DIAGNOSIS — Z681 Body mass index (BMI) 19 or less, adult: Secondary | ICD-10-CM | POA: Diagnosis not present

## 2016-06-25 DIAGNOSIS — R1312 Dysphagia, oropharyngeal phase: Secondary | ICD-10-CM | POA: Diagnosis not present

## 2016-06-25 DIAGNOSIS — D62 Acute posthemorrhagic anemia: Secondary | ICD-10-CM | POA: Diagnosis not present

## 2016-06-25 DIAGNOSIS — J9610 Chronic respiratory failure, unspecified whether with hypoxia or hypercapnia: Secondary | ICD-10-CM | POA: Diagnosis present

## 2016-06-25 DIAGNOSIS — D638 Anemia in other chronic diseases classified elsewhere: Secondary | ICD-10-CM | POA: Diagnosis not present

## 2016-06-25 DIAGNOSIS — R531 Weakness: Secondary | ICD-10-CM | POA: Diagnosis not present

## 2016-06-25 DIAGNOSIS — Z8711 Personal history of peptic ulcer disease: Secondary | ICD-10-CM | POA: Diagnosis not present

## 2016-06-25 DIAGNOSIS — K625 Hemorrhage of anus and rectum: Secondary | ICD-10-CM | POA: Diagnosis not present

## 2016-06-25 DIAGNOSIS — E860 Dehydration: Secondary | ICD-10-CM | POA: Diagnosis present

## 2016-06-25 DIAGNOSIS — R2681 Unsteadiness on feet: Secondary | ICD-10-CM | POA: Diagnosis not present

## 2016-06-25 DIAGNOSIS — M810 Age-related osteoporosis without current pathological fracture: Secondary | ICD-10-CM | POA: Diagnosis present

## 2016-06-25 DIAGNOSIS — Z8551 Personal history of malignant neoplasm of bladder: Secondary | ICD-10-CM | POA: Diagnosis not present

## 2016-06-25 DIAGNOSIS — I482 Chronic atrial fibrillation: Secondary | ICD-10-CM | POA: Diagnosis not present

## 2016-06-25 DIAGNOSIS — E785 Hyperlipidemia, unspecified: Secondary | ICD-10-CM | POA: Diagnosis present

## 2016-06-25 DIAGNOSIS — K648 Other hemorrhoids: Secondary | ICD-10-CM | POA: Diagnosis present

## 2016-06-25 DIAGNOSIS — Z9981 Dependence on supplemental oxygen: Secondary | ICD-10-CM | POA: Diagnosis not present

## 2016-06-25 DIAGNOSIS — Z9181 History of falling: Secondary | ICD-10-CM | POA: Diagnosis not present

## 2016-06-25 DIAGNOSIS — S82192S Other fracture of upper end of left tibia, sequela: Secondary | ICD-10-CM | POA: Diagnosis not present

## 2016-06-25 DIAGNOSIS — Z87891 Personal history of nicotine dependence: Secondary | ICD-10-CM | POA: Diagnosis not present

## 2016-06-25 DIAGNOSIS — I129 Hypertensive chronic kidney disease with stage 1 through stage 4 chronic kidney disease, or unspecified chronic kidney disease: Secondary | ICD-10-CM | POA: Diagnosis present

## 2016-06-25 DIAGNOSIS — Z4789 Encounter for other orthopedic aftercare: Secondary | ICD-10-CM | POA: Diagnosis not present

## 2016-06-25 DIAGNOSIS — R64 Cachexia: Secondary | ICD-10-CM | POA: Diagnosis present

## 2016-06-25 DIAGNOSIS — I4891 Unspecified atrial fibrillation: Secondary | ICD-10-CM | POA: Diagnosis present

## 2016-06-25 DIAGNOSIS — Z9889 Other specified postprocedural states: Secondary | ICD-10-CM

## 2016-06-25 DIAGNOSIS — K219 Gastro-esophageal reflux disease without esophagitis: Secondary | ICD-10-CM | POA: Diagnosis not present

## 2016-06-25 DIAGNOSIS — L8990 Pressure ulcer of unspecified site, unspecified stage: Secondary | ICD-10-CM | POA: Diagnosis present

## 2016-06-25 DIAGNOSIS — R300 Dysuria: Secondary | ICD-10-CM | POA: Diagnosis not present

## 2016-06-25 DIAGNOSIS — M6281 Muscle weakness (generalized): Secondary | ICD-10-CM | POA: Diagnosis not present

## 2016-06-25 DIAGNOSIS — E871 Hypo-osmolality and hyponatremia: Secondary | ICD-10-CM | POA: Diagnosis not present

## 2016-06-25 DIAGNOSIS — S82192A Other fracture of upper end of left tibia, initial encounter for closed fracture: Secondary | ICD-10-CM | POA: Diagnosis not present

## 2016-06-25 DIAGNOSIS — K922 Gastrointestinal hemorrhage, unspecified: Secondary | ICD-10-CM | POA: Diagnosis not present

## 2016-06-25 DIAGNOSIS — S82202D Unspecified fracture of shaft of left tibia, subsequent encounter for closed fracture with routine healing: Secondary | ICD-10-CM | POA: Diagnosis not present

## 2016-06-25 DIAGNOSIS — N183 Chronic kidney disease, stage 3 (moderate): Secondary | ICD-10-CM | POA: Diagnosis not present

## 2016-06-25 DIAGNOSIS — K5731 Diverticulosis of large intestine without perforation or abscess with bleeding: Secondary | ICD-10-CM | POA: Diagnosis present

## 2016-06-25 DIAGNOSIS — Z7901 Long term (current) use of anticoagulants: Secondary | ICD-10-CM | POA: Diagnosis not present

## 2016-06-25 DIAGNOSIS — R031 Nonspecific low blood-pressure reading: Secondary | ICD-10-CM | POA: Diagnosis not present

## 2016-06-25 DIAGNOSIS — K5909 Other constipation: Secondary | ICD-10-CM | POA: Diagnosis not present

## 2016-06-25 DIAGNOSIS — E43 Unspecified severe protein-calorie malnutrition: Secondary | ICD-10-CM | POA: Diagnosis not present

## 2016-06-25 DIAGNOSIS — S82112A Displaced fracture of left tibial spine, initial encounter for closed fracture: Secondary | ICD-10-CM | POA: Diagnosis not present

## 2016-06-25 DIAGNOSIS — K7030 Alcoholic cirrhosis of liver without ascites: Secondary | ICD-10-CM | POA: Diagnosis present

## 2016-06-25 DIAGNOSIS — Z049 Encounter for examination and observation for unspecified reason: Secondary | ICD-10-CM | POA: Diagnosis not present

## 2016-06-25 DIAGNOSIS — E440 Moderate protein-calorie malnutrition: Secondary | ICD-10-CM | POA: Diagnosis not present

## 2016-06-25 DIAGNOSIS — J449 Chronic obstructive pulmonary disease, unspecified: Secondary | ICD-10-CM | POA: Diagnosis present

## 2016-06-25 DIAGNOSIS — I429 Cardiomyopathy, unspecified: Secondary | ICD-10-CM | POA: Diagnosis not present

## 2016-06-25 LAB — CBC
HEMATOCRIT: 26.3 % — AB (ref 39.0–52.0)
HEMOGLOBIN: 8.9 g/dL — AB (ref 13.0–17.0)
MCH: 32.4 pg (ref 26.0–34.0)
MCHC: 33.8 g/dL (ref 30.0–36.0)
MCV: 95.6 fL (ref 78.0–100.0)
Platelets: 125 10*3/uL — ABNORMAL LOW (ref 150–400)
RBC: 2.75 MIL/uL — ABNORMAL LOW (ref 4.22–5.81)
RDW: 16.2 % — ABNORMAL HIGH (ref 11.5–15.5)
WBC: 7.1 10*3/uL (ref 4.0–10.5)

## 2016-06-25 LAB — TYPE AND SCREEN
ABO/RH(D): O POS
Antibody Screen: NEGATIVE
UNIT DIVISION: 0

## 2016-06-25 LAB — BASIC METABOLIC PANEL
ANION GAP: 7 (ref 5–15)
BUN: 25 mg/dL — ABNORMAL HIGH (ref 6–20)
CHLORIDE: 101 mmol/L (ref 101–111)
CO2: 26 mmol/L (ref 22–32)
CREATININE: 1.15 mg/dL (ref 0.61–1.24)
Calcium: 8.2 mg/dL — ABNORMAL LOW (ref 8.9–10.3)
GFR calc non Af Amer: 54 mL/min — ABNORMAL LOW (ref >60)
Glucose, Bld: 108 mg/dL — ABNORMAL HIGH (ref 65–99)
Potassium: 4.6 mmol/L (ref 3.5–5.1)
Sodium: 134 mmol/L — ABNORMAL LOW (ref 135–145)

## 2016-06-25 LAB — HEMOGLOBIN AND HEMATOCRIT, BLOOD
HEMATOCRIT: 26 % — AB (ref 39.0–52.0)
Hemoglobin: 8.8 g/dL — ABNORMAL LOW (ref 13.0–17.0)

## 2016-06-25 MED ORDER — ASPIRIN 325 MG PO TABS: 325.0000 mg | ORAL_TABLET | Freq: Every day | ORAL | Status: DC

## 2016-06-25 MED ORDER — SENNA 8.6 MG PO TABS: 1.0000 | ORAL_TABLET | Freq: Every evening | ORAL | 0 refills | Status: DC | PRN

## 2016-06-25 MED ORDER — SENNA 8.6 MG PO TABS: 1.0000 | ORAL_TABLET | Freq: Every evening | ORAL | Status: DC | PRN

## 2016-06-25 NOTE — Progress Notes (Signed)
Attempted report to [facility]. Report given to [facility]. Leanne Chang, RN

## 2016-06-25 NOTE — Progress Notes (Signed)
Finally able to give report to nurse (Ola) at [facility] and answered all her questions.

## 2016-06-25 NOTE — Clinical Social Work Note (Addendum)
Pt has bed at [facility] today. D/C summary has been sent, pt d/c paperwork , and signed prescriptions are on pt's chart. Pt will go by [facility]. Regina at [facility] [place] will do paperwork with pt. CSW attempted to reach out to pt's spouse however, got no answer and was not an option to leave a voicemail. Regina from [place] [facility] also attempted to reach wife and was unsuccessful. Nurse to call report [phone].  [address] [place]

## 2016-06-25 NOTE — Progress Notes (Signed)
Patient discharged to [facility] via EMS.  Discontinued IV before discharge, took all his belongings with him.  Attempted to give report to [facility] and will try again.

## 2016-06-25 NOTE — Clinical Social Work Note (Signed)
Clinical Social Worker facilitated patient discharge including contacting patient family and facility to confirm patient discharge plans.  Clinical information faxed to facility and family agreeable with plan.  CSW arranged ambulance transport via [facility] to [facility].  RN to call report prior to discharge.  Clinical Social Worker will sign off for now as social work intervention is no longer needed. Please consult us again if new need arises.  Jesse Diyan Dave, LCSW [phone] 

## 2016-06-26 ENCOUNTER — Non-Acute Institutional Stay (SKILLED_NURSING_FACILITY): Payer: Medicare Other | Admitting: Internal Medicine

## 2016-06-26 ENCOUNTER — Encounter: Payer: Self-pay | Admitting: Internal Medicine

## 2016-06-26 DIAGNOSIS — E871 Hypo-osmolality and hyponatremia: Secondary | ICD-10-CM

## 2016-06-26 DIAGNOSIS — R2681 Unsteadiness on feet: Secondary | ICD-10-CM

## 2016-06-26 DIAGNOSIS — D62 Acute posthemorrhagic anemia: Secondary | ICD-10-CM

## 2016-06-26 DIAGNOSIS — I482 Chronic atrial fibrillation, unspecified: Secondary | ICD-10-CM

## 2016-06-26 DIAGNOSIS — S82192S Other fracture of upper end of left tibia, sequela: Secondary | ICD-10-CM | POA: Diagnosis not present

## 2016-06-26 DIAGNOSIS — R300 Dysuria: Secondary | ICD-10-CM | POA: Diagnosis not present

## 2016-06-26 DIAGNOSIS — Z049 Encounter for examination and observation for unspecified reason: Secondary | ICD-10-CM | POA: Diagnosis not present

## 2016-06-26 DIAGNOSIS — E43 Unspecified severe protein-calorie malnutrition: Secondary | ICD-10-CM | POA: Diagnosis not present

## 2016-06-26 DIAGNOSIS — R6889 Other general symptoms and signs: Secondary | ICD-10-CM

## 2016-06-26 DIAGNOSIS — K219 Gastro-esophageal reflux disease without esophagitis: Secondary | ICD-10-CM

## 2016-06-26 DIAGNOSIS — K5909 Other constipation: Secondary | ICD-10-CM

## 2016-06-26 NOTE — Progress Notes (Signed)
LOCATION: [place]  PCP: Cathlean Cower, MD   Code Status: Full Code  Goals of care: Advanced Directive information Advanced Directives 06/20/2016  Does Patient Have a Medical Advance Directive? No       Extended Emergency Contact Information Primary Emergency Contact: Staat,Frances M Address: [place], [place] Phone: [phone] Relation: Spouse Secondary Emergency Contact: Marney,Charles (Chuck)  [place] Phone: [phone] Relation: Son   Allergies  Allergen Reactions  . Amoxicillin-Pot Clavulanate     REACTION: rash  . Ibuprofen     REACTION: itching    Chief Complaint  Patient presents with  . New Admit To SNF    New Admission Visit     HPI:  Patient is a 82 y.o. male seen today for short term rehabilitation post hospital admission from 06/20/16-06/25/16 with left closed tibial fracture post fall. He underwent surgical fixation on 06/22/16. He had acute blood loss anemia and received PRBC transfusion. He also had acute on ckd stage 3. His xarelto was discontinued and he is now on aspirin for DVT prophylaxis. He has history of HTN, COPD, FTT, CKD 3, afib among others. He is seen in his room today.   Review of Systems:  Constitutional: Negative for fever, chills, diaphoresis.  HENT: Negative for headache, congestion, nasal discharge, difficulty swallowing. Positive for soreness on the back of his neck.  He is hard of hearing.  Eyes: Negative for double vision and discharge.  Respiratory: Negative for shortness of breath and wheezing. Positive for occasional cough.   Cardiovascular: Negative for chest pain, palpitations, leg swelling.  Gastrointestinal: Negative for nausea, vomiting, abdominal pain. Positive for heartburn. Last bowel movement was a week ago. Genitourinary: Positive for burning with urination. Musculoskeletal: Negative for fall in the facility. pain under control at  present.  Skin: Negative for itching, rash.  Neurological: Negative for dizziness. Psychiatric/Behavioral: Negative for depression   Past Medical History:  Diagnosis Date  . ABUSE, ALCOHOL, IN REMISSION 06/17/2007  . Adenomatous colon polyp 01/1984  . Alcoholic cirrhosis of liver ([place]) 06/17/2007  . Aortic regurgitation 03/11/2016  . BLADDER CANCER 06/17/2007  . Cardiomyopathy ([place]) 03/11/2016  . CAROTID ARTERY DISEASE 07/23/2009  . COMPRESSION FRACTURE, LUMBAR VERTEBRAE 08/09/2009  . COPD 09/18/2008  . DIVERTICULOSIS, COLON 06/17/2007  . ECHOCARDIOGRAM, ABNORMAL 07/06/2008  . GERD 06/17/2007  . GLUCOSE INTOLERANCE 06/17/2007  . Hiatal hernia   . HYPERLIPIDEMIA 06/17/2007  . HYPERTENSION 06/17/2007  . Impaired glucose tolerance 12/13/2010  . Internal hemorrhoids   . LOW BACK PAIN, CHRONIC 12/17/2009  . OSTEOARTHRITIS, KNEES, BILATERAL 06/19/2009  . OSTEOPOROSIS 06/17/2007  . PEPTIC ULCER DISEASE 06/17/2007  . PSA, INCREASED 09/18/2008  . RENAL INSUFFICIENCY 09/18/2008  . Tubulovillous adenoma 04/2000   Duodenal polyp  . UNSPECIFIED SUDDEN HEARING LOSS 03/20/2009  . VITAMIN D DEFICIENCY 09/18/2008   Past Surgical History:  Procedure Laterality Date  . CHOLECYSTECTOMY  04/2000  . inguinal herniorrhapy    . LUMBAR LAMINECTOMY     x 2  . s/p Bilroth II    . s/p bowel obstruction surgury  12/01  . s/p left middle ear surgury  late 68's   Dr. Juanell Fairly   Social History:   reports that he quit smoking about 11 years ago. He has never used smokeless tobacco. He reports that he does not drink alcohol or use drugs.  Family History  Problem Relation Age of Onset  .  Diabetes Other     5 siblings  . Heart attack Mother   . Emphysema Father   . Breast cancer Sister     Medications:   Medication List       Accurate as of 06/26/16  2:26 PM. Always use your most recent med list.          aspirin 325 MG tablet Take 1 tablet (325 mg total) by mouth daily.   calcium  carbonate 500 MG chewable tablet Commonly known as:  TUMS - dosed in mg elemental calcium Chew 2 tablets by mouth daily.   cholecalciferol 1000 units tablet Commonly known as:  VITAMIN D Take 1,000 Units by mouth daily.   MULTIVITAMINS PO Take 1 tablet by mouth daily.   omeprazole 20 MG capsule Commonly known as:  PRILOSEC Take 1 capsule (20 mg total) by mouth 2 (two) times daily before a meal.   oxyCODONE 5 MG immediate release tablet Commonly known as:  ROXICODONE Take 1 tablet (5 mg total) by mouth every 6 (six) hours as needed.   senna 8.6 MG Tabs tablet Commonly known as:  SENOKOT Take 1 tablet (8.6 mg total) by mouth at bedtime as needed for mild constipation.   UNABLE TO FIND Med Name: Med pass 120 cc 2 times daily between meals       Immunizations: Immunization History  Administered Date(s) Administered  . Influenza Whole 06/27/1998, 05/31/2007, 05/04/2008, 04/27/2009, 06/17/2010  . Influenza, High Dose Seasonal PF 05/08/2014  . Influenza-Unspecified 04/12/2013  . PPD Test 06/25/2016  . Pneumococcal Conjugate-13 08/05/2013  . Pneumococcal Polysaccharide-23 07/28/1994, 06/20/2008  . Td 06/27/1998, 06/20/2008     Physical Exam:  Vitals:   06/26/16 1422  BP: (!) 116/55  Pulse: 68  Resp: 20  Temp: (!) 96.8 F (36 C)  TempSrc: Oral  SpO2: 95%  Weight: 92 lb 11.2 oz (42 kg)  Height: 5\' 10"  (1.778 m)   Body mass index is 13.3 kg/m.  General- elderly frail male, thin built, in no acute distress Head- normocephalic, atraumatic Nose- no maxillary or frontal sinus tenderness, no nasal discharge Throat- moist mucus membrane, edentulous, has dentures  Eyes- PERRLA, EOMI, no pallor, no icterus Neck- no cervical lymphadenopathy Cardiovascular- normal s1,s2, no murmur Respiratory- bilateral clear to auscultation, no wheeze, no rhonchi, no crackles, no use of accessory muscles, 2 l o2 by [place] Abdomen- bowel sounds present, soft, non tender, foley catheter in  place Musculoskeletal- left leg in immobilizer with ace wrap Neurological- alert and oriented to person, place and time Skin- warm and dry, SDTI to sacrum, surgical incision to left knee, left shin and left medial ankle, SDTI to right heel, left medial malleolus and left lateral foot, easy bruising Psychiatry- normal mood and affect    Labs reviewed: Basic Metabolic Panel:  Recent Labs  06/23/16 0615 06/24/16 0420 06/25/16 0355  NA 135 133* 134*  K 4.6 4.1 4.6  CL 103 102 101  CO2 25 25 26   GLUCOSE 127* 112* 108*  BUN 25* 25* 25*  CREATININE 1.20 1.16 1.15  CALCIUM 8.1* 8.0* 8.2*   Liver Function Tests:  Recent Labs  08/24/15 1642 02/27/16 1538 06/21/16 0404  AST 25 29 36  ALT 11 14 18   ALKPHOS 186* 144* 119  BILITOT 0.4 0.5 0.6  PROT 7.7 7.5 5.5*  ALBUMIN 3.8 3.9 2.8*   No results for input(s): LIPASE, AMYLASE in the last 8760 hours. No results for input(s): AMMONIA in the last 8760 hours. CBC:  Recent  Labs  08/24/15 1642 02/27/16 1538 06/20/16 2019  06/23/16 0615 06/24/16 0420 06/24/16 1457 06/25/16 0355 06/25/16 0928  WBC 8.0 8.0 9.5  < > 8.6 7.1  --  7.1  --   NEUTROABS 4.4 5.3 7.0  --   --   --   --   --   --   HGB 11.0* 12.0* 10.6*  < > 8.0* 7.3* 9.4* 8.9* 8.8*  HCT 33.8* 35.3* 32.5*  < > 24.0* 21.8* 27.7* 26.3* 26.0*  MCV 92.9 95.3 98.8  < > 97.6 96.5  --  95.6  --   PLT 170.0 163.0 190  < > 115* 95*  --  125*  --   < > = values in this interval not displayed. Cardiac Enzymes:  Recent Labs  02/27/16 1538  CKTOTAL 38   BNP: Invalid input(s): POCBNP CBG: No results for input(s): GLUCAP in the last 8760 hours.  Radiological Exams: Dg Chest 1 View  Result Date: 06/20/2016 CLINICAL DATA:  82 year old male status post fall today. Initial encounter. EXAM: CHEST 1 VIEW COMPARISON:  02/27/2016, levin 11/15/2007. FINDINGS: Right upper extremity artifact projecting over the diaphragm. Cardiac silhouette has increased on this PA view compared to  August. Other mediastinal contours are within normal limits. Chronic hyperinflation. No pneumothorax, pulmonary edema, pleural effusion or confluent pulmonary opacity. EKG button artifact projecting in the right upper lung. Osteopenia. Previously augmented upper lumbar compression fracture. No acute osseous abnormality identified. IMPRESSION: 1. Cardiac silhouette appears mildly increased since August. Consider pericardial effusion. 2. No other acute cardiopulmonary abnormality. Chronic hyperinflation. 3. Right upper extremity artifact projecting over the diaphragm. Electronically Signed   By: Genevie Ann M.D.   On: 06/20/2016 21:10   Dg Knee 2 Views Left  Result Date: 06/20/2016 CLINICAL DATA:  Patient fell today. Swelling in the left knee. Lower leg feels numb. Not able to straighten knee. Severe pain. EXAM: LEFT KNEE - 1-2 VIEW COMPARISON:  None. FINDINGS: Diffuse bone demineralization. Postoperative changes with hardware fixation in the femoral metaphysis, incompletely included within the field of view. Oblique probably comminuted fractures of the left proximal tibial meta diaphysis with about 7 mm lateral displacement of the distal fracture fragment. Alignment appears intact. Slight cortical irregularity at the junction of the proximal fibular head neck is incompletely visualized but could also represent fibular fracture. Soft tissue swelling. Prominent degenerative changes in the left knee with chondrocalcinosis and small osteochondral defect in the left medial femoral condyle. No significant effusion. Vascular calcifications. IMPRESSION: Diffuse bone demineralization with acute mildly displaced fracture of the proximal left tibia at the junction of the metaphysis and shaft. Probable nondisplaced fracture of the proximal left fibula. Electronically Signed   By: Lucienne Capers M.D.   On: 06/20/2016 21:10   Dg Tibia/fibula Left  Result Date: 06/22/2016 CLINICAL DATA:  Post left tibial fracture repair  EXAM: DG C-ARM 61-120 MIN; LEFT TIBIA AND FIBULA - 2 VIEW COMPARISON:  06/20/2016 FINDINGS: Seven views of the left tibia and fibula submitted. The patient is status post intraoperative repair of proximal left tibial fracture. There is intra medullary rod and 3 metallic fixation screws are noted in proximal left tibia. Two metallic fixation screws are noted in and distal tibia. There is improvement in alignment. Again noted minimal displaced fracture in proximal left fibula with improvement in alignment. IMPRESSION: The patient is status post intraoperative repair of proximal left tibial fracture. There is intra medullary rod and 3 metallic fixation screws are noted in proximal left tibia. Two  metallic fixation screws are noted in and distal tibia. There is improvement in alignment. Again noted minimal displaced fracture in proximal left fibula with improvement in alignment. Fluoroscopy time was 2 minutes 25 seconds. Please see the operative report. Electronically Signed   By: Lahoma Crocker M.D.   On: 06/22/2016 13:32   Dg Tibia/fibula Left  Result Date: 06/20/2016 CLINICAL DATA:  [place] today with swelling in the left knee. Pain and limited range of motion. EXAM: LEFT TIBIA AND FIBULA - 2 VIEW COMPARISON:  None. FINDINGS: Diffuse bone demineralization. Acute oblique fracture of the proximal left tibia at the junction of the metaphysis and shaft. There is about 7 mm lateral displacement of the distal fracture fragment. Alignment is intact. Midshaft and distal tibia appear intact. Limited visualization of the proximal fibula. Possible fracture of the proximal fibula is not well evaluated on this study. See additional report of the left knee. Degenerative changes in the left knee and left ankle. Vascular calcifications throughout. IMPRESSION: Oblique fracture of the proximal left tibia with mild lateral displacement of the distal fracture fragment. Electronically Signed   By: Lucienne Capers M.D.   On: 06/20/2016  21:13   Dg Tibia/fibula Left Port  Result Date: 06/22/2016 CLINICAL DATA:  Status post ORIF of tibial fracture EXAM: PORTABLE LEFT TIBIA AND FIBULA - 2 VIEW COMPARISON:  06/20/2016 FINDINGS: Postsurgical changes are again seen in the distal femur stable from prior exam. A new tibial medullary rod with proximal and distal fixation screws is noted. The fracture fragments are in near anatomic alignment. IMPRESSION: Status post ORIF of tibial fracture. Electronically Signed   By: Inez Catalina M.D.   On: 06/22/2016 12:47   Dg C-arm 1-60 Min  Result Date: 06/22/2016 CLINICAL DATA:  Post left tibial fracture repair EXAM: DG C-ARM 61-120 MIN; LEFT TIBIA AND FIBULA - 2 VIEW COMPARISON:  06/20/2016 FINDINGS: Seven views of the left tibia and fibula submitted. The patient is status post intraoperative repair of proximal left tibial fracture. There is intra medullary rod and 3 metallic fixation screws are noted in proximal left tibia. Two metallic fixation screws are noted in and distal tibia. There is improvement in alignment. Again noted minimal displaced fracture in proximal left fibula with improvement in alignment. IMPRESSION: The patient is status post intraoperative repair of proximal left tibial fracture. There is intra medullary rod and 3 metallic fixation screws are noted in proximal left tibia. Two metallic fixation screws are noted in and distal tibia. There is improvement in alignment. Again noted minimal displaced fracture in proximal left fibula with improvement in alignment. Fluoroscopy time was 2 minutes 25 seconds. Please see the operative report. Electronically Signed   By: Lahoma Crocker M.D.   On: 06/22/2016 13:32    Assessment/Plan  Unsteady gait Post fall with left tibial fracture. Will have patient work with PT/OT as tolerated to regain strength and restore function.  Fall precautions are in place.  Left tibial fracture S/p surgical repair by IM nail. To follow with orthopedic. Will have  him work with physical therapy and occupational therapy team to help with gait training and muscle strengthening exercises.fall precautions. Skin care. Encourage to be out of bed. Continue oxycodone IR 5 mg q6h prn pain. Continue aspirin but change to ec 325 mg daily for dvt prophylaxis. LLE NWB for now.   Blood loss anemia Post rbc transfusion in hospital. Monitor cbc  Dysuria Send u/a with c/s. Hydration to be maintained. Unclear about placement of foley catheter. Discontinue  foley catheter and perform voiding trial in am. If unable to void, will need urology referral and placement of foley catheter.   Hyponatremia Monitor bmp  afib Rate controlled. Off xarelto for now. Make cardiology follow up to consider resuming xarelto  Severe protein calorie malnutrition Poor po intake, get RD consult, weekly weight monitor. Continue medpass supplement  SDTI To sacrum, left leg and right heel. Will provide alternating air flow mattress to prevent pressure ulcer. Heel floaters to both heels. Wound care team to follow.  gerd Continue omeprazole   Chronic constipation On senna 1 tab qhs prn. Discontinue this and start him on senna s 2 tab qhs and miralax daily x 2 days, then miralax as needed. Hydration to be encouraged.    Goals of care: short term rehabilitation   Labs/tests ordered: cbc, cmp 06/30/16  Family/ staff Communication: reviewed care plan with patient and nursing supervisor    Blanchie Serve, MD Internal Medicine [place],  [id number] Cell Phone (Monday-Friday 8 am - 5 pm): [phone] On Call: [phone] and follow prompts after 5 pm and on weekends Office Phone: [phone] Office Fax: [phone]

## 2016-06-27 DIAGNOSIS — R1312 Dysphagia, oropharyngeal phase: Secondary | ICD-10-CM | POA: Diagnosis not present

## 2016-06-27 DIAGNOSIS — L8990 Pressure ulcer of unspecified site, unspecified stage: Secondary | ICD-10-CM | POA: Diagnosis present

## 2016-06-27 DIAGNOSIS — Z9181 History of falling: Secondary | ICD-10-CM | POA: Diagnosis not present

## 2016-06-27 DIAGNOSIS — K7030 Alcoholic cirrhosis of liver without ascites: Secondary | ICD-10-CM | POA: Diagnosis present

## 2016-06-27 DIAGNOSIS — I429 Cardiomyopathy, unspecified: Secondary | ICD-10-CM | POA: Diagnosis present

## 2016-06-27 DIAGNOSIS — N183 Chronic kidney disease, stage 3 (moderate): Secondary | ICD-10-CM | POA: Diagnosis present

## 2016-06-27 DIAGNOSIS — J9610 Chronic respiratory failure, unspecified whether with hypoxia or hypercapnia: Secondary | ICD-10-CM | POA: Diagnosis present

## 2016-06-27 DIAGNOSIS — S82192S Other fracture of upper end of left tibia, sequela: Secondary | ICD-10-CM | POA: Diagnosis not present

## 2016-06-27 DIAGNOSIS — I129 Hypertensive chronic kidney disease with stage 1 through stage 4 chronic kidney disease, or unspecified chronic kidney disease: Secondary | ICD-10-CM | POA: Diagnosis present

## 2016-06-27 DIAGNOSIS — K922 Gastrointestinal hemorrhage, unspecified: Secondary | ICD-10-CM | POA: Diagnosis not present

## 2016-06-27 DIAGNOSIS — Z4789 Encounter for other orthopedic aftercare: Secondary | ICD-10-CM | POA: Diagnosis not present

## 2016-06-27 DIAGNOSIS — Z7901 Long term (current) use of anticoagulants: Secondary | ICD-10-CM | POA: Diagnosis not present

## 2016-06-27 DIAGNOSIS — R64 Cachexia: Secondary | ICD-10-CM | POA: Diagnosis present

## 2016-06-27 DIAGNOSIS — K5731 Diverticulosis of large intestine without perforation or abscess with bleeding: Secondary | ICD-10-CM | POA: Diagnosis present

## 2016-06-27 DIAGNOSIS — J449 Chronic obstructive pulmonary disease, unspecified: Secondary | ICD-10-CM | POA: Diagnosis present

## 2016-06-27 DIAGNOSIS — E785 Hyperlipidemia, unspecified: Secondary | ICD-10-CM | POA: Diagnosis present

## 2016-06-27 DIAGNOSIS — K625 Hemorrhage of anus and rectum: Secondary | ICD-10-CM | POA: Diagnosis not present

## 2016-06-27 DIAGNOSIS — K648 Other hemorrhoids: Secondary | ICD-10-CM | POA: Diagnosis present

## 2016-06-27 DIAGNOSIS — I4891 Unspecified atrial fibrillation: Secondary | ICD-10-CM | POA: Diagnosis present

## 2016-06-27 DIAGNOSIS — D638 Anemia in other chronic diseases classified elsewhere: Secondary | ICD-10-CM | POA: Diagnosis not present

## 2016-06-27 DIAGNOSIS — Z87891 Personal history of nicotine dependence: Secondary | ICD-10-CM | POA: Diagnosis not present

## 2016-06-27 DIAGNOSIS — K5909 Other constipation: Secondary | ICD-10-CM | POA: Diagnosis present

## 2016-06-27 DIAGNOSIS — E871 Hypo-osmolality and hyponatremia: Secondary | ICD-10-CM | POA: Diagnosis present

## 2016-06-27 DIAGNOSIS — Z8711 Personal history of peptic ulcer disease: Secondary | ICD-10-CM | POA: Diagnosis not present

## 2016-06-27 DIAGNOSIS — S82202D Unspecified fracture of shaft of left tibia, subsequent encounter for closed fracture with routine healing: Secondary | ICD-10-CM | POA: Diagnosis not present

## 2016-06-27 DIAGNOSIS — E860 Dehydration: Secondary | ICD-10-CM | POA: Diagnosis present

## 2016-06-27 DIAGNOSIS — E440 Moderate protein-calorie malnutrition: Secondary | ICD-10-CM | POA: Diagnosis not present

## 2016-06-27 DIAGNOSIS — Z8551 Personal history of malignant neoplasm of bladder: Secondary | ICD-10-CM | POA: Diagnosis not present

## 2016-06-27 DIAGNOSIS — Z681 Body mass index (BMI) 19 or less, adult: Secondary | ICD-10-CM | POA: Diagnosis not present

## 2016-06-27 DIAGNOSIS — R031 Nonspecific low blood-pressure reading: Secondary | ICD-10-CM | POA: Diagnosis not present

## 2016-06-27 DIAGNOSIS — E43 Unspecified severe protein-calorie malnutrition: Secondary | ICD-10-CM | POA: Diagnosis present

## 2016-06-27 DIAGNOSIS — M6281 Muscle weakness (generalized): Secondary | ICD-10-CM | POA: Diagnosis not present

## 2016-06-27 DIAGNOSIS — M810 Age-related osteoporosis without current pathological fracture: Secondary | ICD-10-CM | POA: Diagnosis present

## 2016-06-27 DIAGNOSIS — Z9981 Dependence on supplemental oxygen: Secondary | ICD-10-CM | POA: Diagnosis not present

## 2016-06-27 DIAGNOSIS — I482 Chronic atrial fibrillation: Secondary | ICD-10-CM | POA: Diagnosis not present

## 2016-06-27 LAB — HEPATIC FUNCTION PANEL
ALT: 7 U/L — AB (ref 10–40)
AST: 22 U/L (ref 14–40)
Alkaline Phosphatase: 129 U/L — AB (ref 25–125)
Bilirubin, Total: 0.8 mg/dL

## 2016-06-27 LAB — BASIC METABOLIC PANEL
BUN: 23 mg/dL — AB (ref 4–21)
CREATININE: 0.9 mg/dL (ref 0.6–1.3)
GLUCOSE: 90 mg/dL
POTASSIUM: 4.5 mmol/L (ref 3.4–5.3)
SODIUM: 137 mmol/L (ref 137–147)

## 2016-06-27 LAB — CBC AND DIFFERENTIAL
HCT: 28 % — AB (ref 41–53)
HEMOGLOBIN: 9.1 g/dL — AB (ref 13.5–17.5)
Platelets: 170 10*3/uL (ref 150–399)
WBC: 7 10*3/mL

## 2016-06-28 ENCOUNTER — Telehealth: Payer: Self-pay

## 2016-06-28 NOTE — Telephone Encounter (Signed)
Pt is on TCM list and has been dc'ed to SNF.

## 2016-06-30 ENCOUNTER — Non-Acute Institutional Stay (SKILLED_NURSING_FACILITY): Payer: Medicare Other | Admitting: Family

## 2016-06-30 ENCOUNTER — Encounter (HOSPITAL_COMMUNITY): Payer: Self-pay | Admitting: Orthopaedic Surgery

## 2016-06-30 DIAGNOSIS — E440 Moderate protein-calorie malnutrition: Secondary | ICD-10-CM | POA: Diagnosis not present

## 2016-06-30 DIAGNOSIS — D638 Anemia in other chronic diseases classified elsewhere: Secondary | ICD-10-CM

## 2016-06-30 NOTE — Progress Notes (Signed)
Location:  [place] Room Number: [place] of Service:  SNF [phone]) Provider:  Dinah Ngetich FNP-C   Cathlean Cower, MD  Patient Care Team: Biagio Borg, MD as PCP - General  Extended Emergency Contact Information Primary Emergency Contact: Nabers,Frances M Address: [address]          [place], [place] Phone: [phone] Relation: Spouse Secondary Emergency Contact: [place] Harrie Jeans)  [place] Phone: [phone] Relation: Son  Code Status: Full Code  Goals of care: Advanced Directive information Advanced Directives 06/20/2016  Does Patient Have a Medical Advance Directive? No     Chief Complaint  Patient presents with  . Acute Visit    abnormal labs     HPI:  Pt is a 82 y.o. male seen today at [facility] for an acute visit for evaluation of abnormal lab results. He is seen in his room today. He states left leg pain under control. He is status post Left proximal tibial fracture. He under went surgical fixation on 06/22/2016. His recent lab results showed Hgb 9.1, HCT 27.6, Ca 8.1, TP 5.2, Alb 2.53 ( 06/27/2016). He denies any fever, chills, headache,dizziness or shortness of breath.Facility staff reports no new concerns.    Past Medical History:  Diagnosis Date  . ABUSE, ALCOHOL, IN REMISSION 06/17/2007  . Adenomatous colon polyp 01/1984  . Alcoholic cirrhosis of liver ([place]) 06/17/2007  . Aortic regurgitation 03/11/2016  . BLADDER CANCER 06/17/2007  . Cardiomyopathy ([place]) 03/11/2016  . CAROTID ARTERY DISEASE 07/23/2009  . COMPRESSION FRACTURE, LUMBAR VERTEBRAE 08/09/2009  . COPD 09/18/2008  . DIVERTICULOSIS, COLON 06/17/2007  . ECHOCARDIOGRAM, ABNORMAL 07/06/2008  . GERD 06/17/2007  . GLUCOSE INTOLERANCE 06/17/2007  . Hiatal hernia   . HYPERLIPIDEMIA 06/17/2007  . HYPERTENSION 06/17/2007  . Impaired glucose tolerance 12/13/2010  . Internal hemorrhoids   . LOW  BACK PAIN, CHRONIC 12/17/2009  . OSTEOARTHRITIS, KNEES, BILATERAL 06/19/2009  . OSTEOPOROSIS 06/17/2007  . PEPTIC ULCER DISEASE 06/17/2007  . PSA, INCREASED 09/18/2008  . RENAL INSUFFICIENCY 09/18/2008  . Tubulovillous adenoma 04/2000   Duodenal polyp  . UNSPECIFIED SUDDEN HEARING LOSS 03/20/2009  . VITAMIN D DEFICIENCY 09/18/2008   Past Surgical History:  Procedure Laterality Date  . CHOLECYSTECTOMY  04/2000  . inguinal herniorrhapy    . LUMBAR LAMINECTOMY     x 2  . s/p Bilroth II    . s/p bowel obstruction surgury  12/01  . s/p left middle ear surgury  late 39's   Dr. Juanell Fairly  . TIBIA IM NAIL INSERTION Left 06/22/2016   Procedure: INTRAMEDULLARY (IM) NAIL TIBIAL;  Surgeon: Leandrew Koyanagi, MD;  Location: [facility];  Service: Orthopedics;  Laterality: Left;    Allergies  Allergen Reactions  . Amoxicillin-Pot Clavulanate     REACTION: rash  . Ibuprofen     REACTION: itching      Medication List       Accurate as of 06/30/16  5:23 PM. Always use your most recent med list.          aspirin 325 MG tablet Take 1 tablet (325 mg total) by mouth daily.   calcium carbonate 500 MG chewable tablet Commonly known as:  TUMS - dosed in mg elemental calcium Chew 2 tablets by mouth daily.   cholecalciferol 1000 units tablet Commonly known as:  VITAMIN D Take 1,000 Units by mouth daily.   MULTIVITAMINS PO Take 1 tablet by  mouth daily.   omeprazole 20 MG capsule Commonly known as:  PRILOSEC Take 1 capsule (20 mg total) by mouth 2 (two) times daily before a meal.   oxyCODONE 5 MG immediate release tablet Commonly known as:  ROXICODONE Take 1 tablet (5 mg total) by mouth every 6 (six) hours as needed.   polyethylene glycol packet Commonly known as:  MIRALAX / GLYCOLAX Take 17 g by mouth daily as needed for moderate constipation.   senna 8.6 MG Tabs tablet Commonly known as:  SENOKOT Take 1 tablet (8.6 mg total) by mouth at bedtime as needed for mild constipation.   UNABLE  TO FIND Med Name: Med pass 120 cc 2 times daily between meals       Review of Systems  Constitutional: Negative for activity change, appetite change, chills and fever.  HENT: Negative for congestion, rhinorrhea, sinus pressure, sneezing and sore throat.   Eyes: Negative.   Respiratory: Negative for cough, chest tightness, shortness of breath and wheezing.   Cardiovascular: Negative for chest pain, palpitations and leg swelling.  Gastrointestinal: Negative for abdominal distention, abdominal pain, constipation, diarrhea, nausea and vomiting.  Genitourinary: Negative for dysuria, flank pain, frequency and urgency.  Musculoskeletal: Positive for gait problem.       Left leg surgical incision   Skin: Negative for color change, pallor and rash.  Neurological: Negative for dizziness, seizures, light-headedness and headaches.  Psychiatric/Behavioral: Negative for agitation, confusion and sleep disturbance. The patient is not nervous/anxious.     Immunization History  Administered Date(s) Administered  . Influenza Whole 06/27/1998, 05/31/2007, 05/04/2008, 04/27/2009, 06/17/2010  . Influenza, High Dose Seasonal PF 05/08/2014  . Influenza-Unspecified 04/12/2013  . PPD Test 06/25/2016  . Pneumococcal Conjugate-13 08/05/2013  . Pneumococcal Polysaccharide-23 07/28/1994, 06/20/2008  . Td 06/27/1998, 06/20/2008   Pertinent  Health Maintenance Due  Topic Date Due  . INFLUENZA VACCINE  02/26/2016  . PNA vac Low Risk Adult  Completed   Fall Risk  08/24/2015 02/02/2014 08/05/2013  Falls in the past year? Yes Yes No  Number falls in past yr: 1 1 -  Injury with Fall? No No -      Vitals:   06/30/16 1100  BP: (!) 116/55  Pulse: 68  Resp: 20  Temp: (!) 96.8 F (36 C)  SpO2: 95%  Weight: 90 lb (40.8 kg)  Height: 5\' 10"  (1.778 m)   Body mass index is 12.91 kg/m. Physical Exam  Constitutional: He appears well-developed and well-nourished. No distress.  HENT:  Head: Normocephalic.    Mouth/Throat: Oropharynx is clear and moist. No oropharyngeal exudate.  Eyes: Conjunctivae and EOM are normal. Pupils are equal, round, and reactive to light. Right eye exhibits no discharge. Left eye exhibits no discharge. No scleral icterus.  Neck: Normal range of motion. No JVD present. No thyromegaly present.  Cardiovascular: Normal rate and intact distal pulses.  Exam reveals no gallop and no friction rub.   No murmur heard. Pulmonary/Chest: Effort normal and breath sounds normal. No respiratory distress. He has no wheezes. He has no rales.  Abdominal: Soft. Bowel sounds are normal. He exhibits no distension. There is no tenderness. There is no rebound and no guarding.  Musculoskeletal: He exhibits no edema, tenderness or deformity.  Left full leg brace in place.   Lymphadenopathy:    He has no cervical adenopathy.  Neurological: He is alert.  Skin: Skin is warm and dry. No rash noted. No erythema. No pallor.  Psychiatric: He has a normal mood and affect.  Labs reviewed:  Recent Labs  06/23/16 0615 06/24/16 0420 06/25/16 0355  NA 135 133* 134*  K 4.6 4.1 4.6  CL 103 102 101  CO2 25 25 26   GLUCOSE 127* 112* 108*  BUN 25* 25* 25*  CREATININE 1.20 1.16 1.15  CALCIUM 8.1* 8.0* 8.2*    Recent Labs  08/24/15 1642 02/27/16 1538 06/21/16 0404  AST 25 29 36  ALT 11 14 18   ALKPHOS 186* 144* 119  BILITOT 0.4 0.5 0.6  PROT 7.7 7.5 5.5*  ALBUMIN 3.8 3.9 2.8*    Recent Labs  08/24/15 1642 02/27/16 1538 06/20/16 2019  06/23/16 0615 06/24/16 0420 06/24/16 1457 06/25/16 0355 06/25/16 0928  WBC 8.0 8.0 9.5  < > 8.6 7.1  --  7.1  --   NEUTROABS 4.4 5.3 7.0  --   --   --   --   --   --   HGB 11.0* 12.0* 10.6*  < > 8.0* 7.3* 9.4* 8.9* 8.8*  HCT 33.8* 35.3* 32.5*  < > 24.0* 21.8* 27.7* 26.3* 26.0*  MCV 92.9 95.3 98.8  < > 97.6 96.5  --  95.6  --   PLT 170.0 163.0 190  < > 115* 95*  --  125*  --   < > = values in this interval not displayed. Lab Results  Component  Value Date   TSH 2.05 02/27/2016   Lab Results  Component Value Date   HGBA1C 5.7 08/24/2015   Lab Results  Component Value Date   CHOL 151 02/27/2016   HDL 44.60 02/27/2016   LDLCALC 85 02/27/2016   TRIG 109.0 02/27/2016   CHOLHDL 3 02/27/2016   Assessment/Plan 1. Anemia of chronic disease Hgb 9.1, HCT 27.6 ( 06/27/2016).Status post left proximal tibial surgical fixation.Asymptomatic. Will monitor CBC for now.Obtain CBC 07/14/2016.   2. Moderate protein-calorie malnutrition (HCC)  TP 5.2, Alb 2.53 ( 06/27/2016). Will obtain RD consults for protein supplements. Obtain  BMP  07/14/2016  3. Hypocalcemia Ca 8.1 ( 06/27/2016). Suspect possible dietary related. RD consult for now. Obtain  BMP 07/14/2016   Family/ staff Communication: Reviewed plan of care with patient and facility Nurse supervisor.   Labs/tests ordered:  Obtain CBC, BMP  07/14/2016

## 2016-07-02 ENCOUNTER — Telehealth: Payer: Self-pay | Admitting: Internal Medicine

## 2016-07-02 NOTE — Telephone Encounter (Signed)
Called patient to schedule annual wellness appt. Patient did not answer.

## 2016-07-04 ENCOUNTER — Inpatient Hospital Stay (HOSPITAL_COMMUNITY)
Admission: EM | Admit: 2016-07-04 | Discharge: 2016-07-07 | DRG: 377 | Disposition: A | Discharge status: Skilled Nursing Facility | Payer: Medicare Other | Attending: Internal Medicine | Admitting: Internal Medicine

## 2016-07-04 ENCOUNTER — Encounter (HOSPITAL_COMMUNITY): Payer: Self-pay | Admitting: *Deleted

## 2016-07-04 DIAGNOSIS — K5731 Diverticulosis of large intestine without perforation or abscess with bleeding: Principal | ICD-10-CM | POA: Diagnosis present

## 2016-07-04 DIAGNOSIS — Z8711 Personal history of peptic ulcer disease: Secondary | ICD-10-CM

## 2016-07-04 DIAGNOSIS — K5900 Constipation, unspecified: Secondary | ICD-10-CM | POA: Diagnosis present

## 2016-07-04 DIAGNOSIS — I429 Cardiomyopathy, unspecified: Secondary | ICD-10-CM | POA: Diagnosis present

## 2016-07-04 DIAGNOSIS — R64 Cachexia: Secondary | ICD-10-CM | POA: Diagnosis present

## 2016-07-04 DIAGNOSIS — J9610 Chronic respiratory failure, unspecified whether with hypoxia or hypercapnia: Secondary | ICD-10-CM | POA: Diagnosis not present

## 2016-07-04 DIAGNOSIS — K219 Gastro-esophageal reflux disease without esophagitis: Secondary | ICD-10-CM | POA: Diagnosis present

## 2016-07-04 DIAGNOSIS — I129 Hypertensive chronic kidney disease with stage 1 through stage 4 chronic kidney disease, or unspecified chronic kidney disease: Secondary | ICD-10-CM | POA: Diagnosis present

## 2016-07-04 DIAGNOSIS — K922 Gastrointestinal hemorrhage, unspecified: Secondary | ICD-10-CM

## 2016-07-04 DIAGNOSIS — D638 Anemia in other chronic diseases classified elsewhere: Secondary | ICD-10-CM | POA: Diagnosis present

## 2016-07-04 DIAGNOSIS — S82209A Unspecified fracture of shaft of unspecified tibia, initial encounter for closed fracture: Secondary | ICD-10-CM | POA: Diagnosis present

## 2016-07-04 DIAGNOSIS — E871 Hypo-osmolality and hyponatremia: Secondary | ICD-10-CM | POA: Diagnosis present

## 2016-07-04 DIAGNOSIS — Z7982 Long term (current) use of aspirin: Secondary | ICD-10-CM

## 2016-07-04 DIAGNOSIS — Z79899 Other long term (current) drug therapy: Secondary | ICD-10-CM

## 2016-07-04 DIAGNOSIS — I4891 Unspecified atrial fibrillation: Secondary | ICD-10-CM | POA: Diagnosis not present

## 2016-07-04 DIAGNOSIS — J449 Chronic obstructive pulmonary disease, unspecified: Secondary | ICD-10-CM | POA: Diagnosis present

## 2016-07-04 DIAGNOSIS — E860 Dehydration: Secondary | ICD-10-CM | POA: Diagnosis present

## 2016-07-04 DIAGNOSIS — Z8551 Personal history of malignant neoplasm of bladder: Secondary | ICD-10-CM

## 2016-07-04 DIAGNOSIS — E43 Unspecified severe protein-calorie malnutrition: Secondary | ICD-10-CM | POA: Diagnosis present

## 2016-07-04 DIAGNOSIS — S82192S Other fracture of upper end of left tibia, sequela: Secondary | ICD-10-CM

## 2016-07-04 DIAGNOSIS — K5909 Other constipation: Secondary | ICD-10-CM | POA: Diagnosis present

## 2016-07-04 DIAGNOSIS — M810 Age-related osteoporosis without current pathological fracture: Secondary | ICD-10-CM | POA: Diagnosis present

## 2016-07-04 DIAGNOSIS — Z681 Body mass index (BMI) 19 or less, adult: Secondary | ICD-10-CM

## 2016-07-04 DIAGNOSIS — I482 Chronic atrial fibrillation: Secondary | ICD-10-CM

## 2016-07-04 DIAGNOSIS — Z7901 Long term (current) use of anticoagulants: Secondary | ICD-10-CM

## 2016-07-04 DIAGNOSIS — K648 Other hemorrhoids: Secondary | ICD-10-CM | POA: Diagnosis present

## 2016-07-04 DIAGNOSIS — K7030 Alcoholic cirrhosis of liver without ascites: Secondary | ICD-10-CM | POA: Diagnosis present

## 2016-07-04 DIAGNOSIS — M549 Dorsalgia, unspecified: Secondary | ICD-10-CM | POA: Diagnosis present

## 2016-07-04 DIAGNOSIS — Z9981 Dependence on supplemental oxygen: Secondary | ICD-10-CM

## 2016-07-04 DIAGNOSIS — E559 Vitamin D deficiency, unspecified: Secondary | ICD-10-CM | POA: Diagnosis present

## 2016-07-04 DIAGNOSIS — Z87891 Personal history of nicotine dependence: Secondary | ICD-10-CM

## 2016-07-04 DIAGNOSIS — E785 Hyperlipidemia, unspecified: Secondary | ICD-10-CM | POA: Diagnosis present

## 2016-07-04 DIAGNOSIS — R296 Repeated falls: Secondary | ICD-10-CM | POA: Diagnosis present

## 2016-07-04 DIAGNOSIS — N183 Chronic kidney disease, stage 3 (moderate): Secondary | ICD-10-CM | POA: Diagnosis present

## 2016-07-04 DIAGNOSIS — L8990 Pressure ulcer of unspecified site, unspecified stage: Secondary | ICD-10-CM | POA: Insufficient documentation

## 2016-07-04 LAB — BASIC METABOLIC PANEL WITH GFR
Anion gap: 6 (ref 5–15)
BUN: 35 mg/dL — ABNORMAL HIGH (ref 6–20)
CO2: 22 mmol/L (ref 22–32)
Calcium: 8.4 mg/dL — ABNORMAL LOW (ref 8.9–10.3)
Chloride: 97 mmol/L — ABNORMAL LOW (ref 101–111)
Creatinine, Ser: 0.86 mg/dL (ref 0.61–1.24)
GFR calc Af Amer: >60 mL/min
GFR calc non Af Amer: >60 mL/min
Glucose, Bld: 108 mg/dL — ABNORMAL HIGH (ref 65–99)
Potassium: 4.4 mmol/L (ref 3.5–5.1)
Sodium: 125 mmol/L — ABNORMAL LOW (ref 135–145)

## 2016-07-04 LAB — URINALYSIS, ROUTINE W REFLEX MICROSCOPIC
BACTERIA UA: NONE SEEN
BILIRUBIN URINE: NEGATIVE
Glucose, UA: NEGATIVE mg/dL
Hgb urine dipstick: NEGATIVE
KETONES UR: NEGATIVE mg/dL
Nitrite: NEGATIVE
PROTEIN: NEGATIVE mg/dL
SQUAMOUS EPITHELIAL / LPF: NONE SEEN
Specific Gravity, Urine: 1.019 (ref 1.005–1.030)
pH: 7 (ref 5.0–8.0)

## 2016-07-04 LAB — CBC WITH DIFFERENTIAL/PLATELET
Basophils Absolute: 0 K/uL (ref 0.0–0.1)
Basophils Relative: 0 %
Eosinophils Absolute: 0.1 K/uL (ref 0.0–0.7)
Eosinophils Relative: 1 %
HCT: 26.1 % — ABNORMAL LOW (ref 39.0–52.0)
Hemoglobin: 8.9 g/dL — ABNORMAL LOW (ref 13.0–17.0)
Lymphocytes Relative: 10 %
Lymphs Abs: 1 K/uL (ref 0.7–4.0)
MCH: 32 pg (ref 26.0–34.0)
MCHC: 34.1 g/dL (ref 30.0–36.0)
MCV: 93.9 fL (ref 78.0–100.0)
Monocytes Absolute: 0.8 K/uL (ref 0.1–1.0)
Monocytes Relative: 8 %
Neutro Abs: 8.2 K/uL — ABNORMAL HIGH (ref 1.7–7.7)
Neutrophils Relative %: 81 %
Platelets: 268 K/uL (ref 150–400)
RBC: 2.78 MIL/uL — ABNORMAL LOW (ref 4.22–5.81)
RDW: 14.7 % (ref 11.5–15.5)
WBC: 10.1 K/uL (ref 4.0–10.5)

## 2016-07-04 LAB — HEMOGLOBIN AND HEMATOCRIT, BLOOD
HEMATOCRIT: 26.3 % — AB (ref 39.0–52.0)
Hemoglobin: 9 g/dL — ABNORMAL LOW (ref 13.0–17.0)

## 2016-07-04 LAB — PROTIME-INR
INR: 1.07
Prothrombin Time: 13.9 seconds (ref 11.4–15.2)

## 2016-07-04 LAB — TYPE AND SCREEN
ABO/RH(D): O POS
Antibody Screen: NEGATIVE

## 2016-07-04 LAB — POC OCCULT BLOOD, ED: FECAL OCCULT BLD: POSITIVE — AB

## 2016-07-04 MED ORDER — PANTOPRAZOLE SODIUM 40 MG PO TBEC
40.0000 mg | DELAYED_RELEASE_TABLET | Freq: Every day | ORAL | Status: DC
  Administered 2016-07-05 – 2016-07-07 (×3): 40 mg via ORAL
  Filled 2016-07-04 (×3): qty 1

## 2016-07-04 MED ORDER — OXYCODONE HCL 5 MG PO TABS
5.0000 mg | ORAL_TABLET | Freq: Four times a day (QID) | ORAL | Status: DC | PRN
  Administered 2016-07-04 – 2016-07-06 (×4): 5 mg via ORAL
  Filled 2016-07-04 (×4): qty 1

## 2016-07-04 MED ORDER — PANTOPRAZOLE SODIUM 40 MG IV SOLR
40.0000 mg | Freq: Once | INTRAVENOUS | Status: AC
  Administered 2016-07-04: 40 mg via INTRAVENOUS
  Filled 2016-07-04: qty 40

## 2016-07-04 MED ORDER — ALBUTEROL SULFATE (2.5 MG/3ML) 0.083% IN NEBU: 2.5000 mg | INHALATION_SOLUTION | RESPIRATORY_TRACT | Status: DC | PRN

## 2016-07-04 MED ORDER — CALCIUM CARBONATE ANTACID 500 MG PO CHEW
2.0000 | CHEWABLE_TABLET | Freq: Every day | ORAL | Status: DC
  Administered 2016-07-05 – 2016-07-07 (×3): 400 mg via ORAL
  Filled 2016-07-04 (×3): qty 2

## 2016-07-04 MED ORDER — SENNA 8.6 MG PO TABS: 1.0000 | ORAL_TABLET | Freq: Every evening | ORAL | Status: DC | PRN

## 2016-07-04 MED ORDER — POLYETHYLENE GLYCOL 3350 17 G PO PACK: 17.0000 g | PACK | Freq: Every day | ORAL | Status: DC | PRN

## 2016-07-04 MED ORDER — SODIUM CHLORIDE 0.9 % IV SOLN
INTRAVENOUS | Status: DC
  Administered 2016-07-04 – 2016-07-07 (×2): via INTRAVENOUS

## 2016-07-04 MED ORDER — VITAMIN D3 25 MCG (1000 UNIT) PO TABS
1000.0000 [IU] | ORAL_TABLET | Freq: Every day | ORAL | Status: DC
  Administered 2016-07-05 – 2016-07-07 (×3): 1000 [IU] via ORAL
  Filled 2016-07-04 (×3): qty 1

## 2016-07-04 MED ORDER — OCUVITE-LUTEIN PO CAPS
1.0000 | ORAL_CAPSULE | Freq: Every day | ORAL | Status: DC
  Administered 2016-07-05 – 2016-07-07 (×3): 1 via ORAL
  Filled 2016-07-04 (×3): qty 1

## 2016-07-04 NOTE — H&P (Signed)
[facility] H&P   Patient Demographics:    Sean [name], is a 82 y.o. male  MRN: [id number]   DOB - 1925/05/04  Admit Date - 07/04/2016  Outpatient Primary MD for the patient is Cathlean Cower, MD  Referring MD/NP/PA: Thom Chimes  Outpatient Specialists: GI Dr Fuller Plan.  Patient coming from: SNF  Chief Complaint  Patient presents with  . GI Bleeding      HPI:    Sean [name]  is a 82 y.o. male, 82 y.o. male with medical history significant of HTN, HLD, A.Fib ( xarelto stopped recently) PVD, COPD, GERD, Recently discharged to SNF from [facility] status post surgical repair of left tibial fracture. Patient was sent from [facility] with GI bleeding, patient was noticed to have bright red blood in brief this, so he was brought to ED, patient is currently on full dose aspirin, Xarelto has been stopped on recent discharge giving hemoglobin trending down, and recurrent falls, patient reports generalized weakness, lower back pain, left leg pain, as well wife reports constipation and straining episode yesterday ,reports generalized weakness and fatigue, dysuria and polyuria, but he denies fever, chills, nausea vomiting or abdominal pain, diarrhea. Patient's most recent endoscopy in 2013, significant for Bilroth type II, colonoscopy in 2008 significant for internal hemorrhoids, polyps and diverticulosis. In ED , rectal exam was significant for bright blood, and sodium of 125, hemoglobin of 8.9, baseline is 8.8, hospitalist requested to admit for further workup.      Review of systems:    In addition to the HPI above,  No Fever-chills, No Headache, No changes with Vision or hearing, No problems swallowing food or Liquids, No Chest pain, Cough or Shortness of Breath, No Abdominal pain, No Nausea or Vommitting, reports constipation, No Blood in Urine, blod in stool No dysuria, No new skin  rashes or bruises, Durene Fruits of lower back pain, left leg pain No new weakness, tingling, numbness in any extremity,He reports generalized weakness No recent weight gain or loss, No polyuria, polydypsia or polyphagia, No significant Mental Stressors.  A full 10 point Review of Systems was done, except as stated above, all other Review of Systems were negative.   With Past History of the following :    Past Medical History:  Diagnosis Date  . ABUSE, ALCOHOL, IN REMISSION 06/17/2007  . Adenomatous colon polyp 01/1984  . Alcoholic cirrhosis of liver ([place]) 06/17/2007  . Aortic regurgitation 03/11/2016  . BLADDER CANCER 06/17/2007  . Cardiomyopathy ([place]) 03/11/2016  . CAROTID ARTERY DISEASE 07/23/2009  . COMPRESSION FRACTURE, LUMBAR VERTEBRAE 08/09/2009  . COPD 09/18/2008  . DIVERTICULOSIS, COLON 06/17/2007  . ECHOCARDIOGRAM, ABNORMAL 07/06/2008  . GERD 06/17/2007  . GLUCOSE INTOLERANCE 06/17/2007  . Hiatal hernia   . HYPERLIPIDEMIA 06/17/2007  . HYPERTENSION 06/17/2007  . Impaired glucose tolerance 12/13/2010  . Internal hemorrhoids   . LOW BACK PAIN, CHRONIC 12/17/2009  .  OSTEOARTHRITIS, KNEES, BILATERAL 06/19/2009  . OSTEOPOROSIS 06/17/2007  . PEPTIC ULCER DISEASE 06/17/2007  . PSA, INCREASED 09/18/2008  . RENAL INSUFFICIENCY 09/18/2008  . Tubulovillous adenoma 04/2000   Duodenal polyp  . UNSPECIFIED SUDDEN HEARING LOSS 03/20/2009  . VITAMIN D DEFICIENCY 09/18/2008      Past Surgical History:  Procedure Laterality Date  . CHOLECYSTECTOMY  04/2000  . inguinal herniorrhapy    . LUMBAR LAMINECTOMY     x 2  . s/p Bilroth II    . s/p bowel obstruction surgury  12/01  . s/p left middle ear surgury  late 65's   Dr. Juanell Fairly  . TIBIA IM NAIL INSERTION Left 06/22/2016   Procedure: INTRAMEDULLARY (IM) NAIL TIBIAL;  Surgeon: Leandrew Koyanagi, MD;  Location: [facility];  Service: Orthopedics;  Laterality: Left;      Social History:     Social History  Substance Use Topics  . Smoking  status: Former Smoker    Quit date: 10/09/2004  . Smokeless tobacco: Never Used  . Alcohol use No     Lives - Currently in subacute rehabilitation [place] - with PT, recently was able to stand up with full assist     Family History :     Family History  Problem Relation Age of Onset  . Diabetes Other     5 siblings  . Heart attack Mother   . Emphysema Father   . Breast cancer Sister       Home Medications:   Prior to Admission medications   Medication Sig Start Date End Date Taking? Authorizing Provider  aspirin 325 MG tablet Take 1 tablet (325 mg total) by mouth daily. 06/25/16  Yes Eber Jones, MD  calcium carbonate (TUMS - DOSED IN MG ELEMENTAL CALCIUM) 500 MG chewable tablet Chew 2 tablets by mouth daily.   Yes Historical Provider, MD  cholecalciferol (VITAMIN D) 1000 units tablet Take 1,000 Units by mouth daily.   Yes Historical Provider, MD  Multiple Vitamins-Minerals (DECUBI-VITE) CAPS Take 1 capsule by mouth daily.   Yes Historical Provider, MD  omeprazole (PRILOSEC) 20 MG capsule Take 1 capsule (20 mg total) by mouth 2 (two) times daily before a meal. 10/01/15  Yes Biagio Borg, MD  oxyCODONE (ROXICODONE) 5 MG immediate release tablet Take 1 tablet (5 mg total) by mouth every 6 (six) hours as needed. 06/24/16  Yes Theodis Blaze, MD  polyethylene glycol [facility] / Floria Raveling) packet Take 17 g by mouth daily as needed for moderate constipation.   Yes Historical Provider, MD  senna (SENOKOT) 8.6 MG TABS tablet Take 1 tablet (8.6 mg total) by mouth at bedtime as needed for mild constipation. 06/25/16  Yes Eber Jones, MD  UNABLE TO FIND Med Name: Med pass 120 cc 2 times daily between meals   Yes Historical Provider, MD     Allergies:     Allergies  Allergen Reactions  . Amoxicillin-Pot Clavulanate     REACTION: rash  . Ibuprofen     REACTION: itching     Physical Exam:   Vitals  Blood pressure 107/60, pulse (!) 55, temperature 98  F (36.7 C), temperature source Oral, resp. rate 18, SpO2 92 %.   1. General Frail elderly thin-appearing male lying in bed in NAD,    2. Normal affect and insight, Not Suicidal or Homicidal, Awake Alert, Oriented X 3.  3. No F.N deficits, ALL C.Nerves Intact, Strength 5/5 all 4 extremities (left lower extremity in  immobilizer) Sensation intact all 4 extremities, Plantars down going.  4. Ears and Eyes appear Normal, Conjunctivae clear, PERRLA. Moist Oral Mucosa.  5. Supple Neck, No JVD, No cervical lymphadenopathy appriciated, No Carotid Bruits.  6. Symmetrical Chest wall movement, Good air movement bilaterally, CTAB.  7. Irregular irregular, No Gallops, Rubs or Murmurs, No Parasternal Heave.  8. Positive Bowel Sounds, Abdomen Soft, No tenderness, No organomegaly appriciated,No rebound -guarding or rigidity.  9.  No Cyanosis, Normal Skin Turgor, No Skin Rash or Bruise.  10. Good muscle tone,  joints appear normal , no effusions, left lower extremity in immobilizer  11. No Palpable Lymph Nodes in Neck or Axillae     Data Review:    CBC  Recent Labs Lab 07/04/16 1521  WBC 10.1  HGB 8.9*  HCT 26.1*  PLT 268  MCV 93.9  MCH 32.0  MCHC 34.1  RDW 14.7  LYMPHSABS 1.0  MONOABS 0.8  EOSABS 0.1  BASOSABS 0.0   ------------------------------------------------------------------------------------------------------------------  Chemistries   Recent Labs Lab 07/04/16 1521  NA 125*  K 4.4  CL 97*  CO2 22  GLUCOSE 108*  BUN 35*  CREATININE 0.86  CALCIUM 8.4*   ------------------------------------------------------------------------------------------------------------------ estimated creatinine clearance is 33.6 mL/min (by C-G formula based on SCr of 0.86 mg/dL). ------------------------------------------------------------------------------------------------------------------ No results for input(s): TSH, T4TOTAL, T3FREE, THYROIDAB in the last 72 hours.  Invalid  input(s): FREET3  Coagulation profile  Recent Labs Lab 07/04/16 1521  INR 1.07   ------------------------------------------------------------------------------------------------------------------- No results for input(s): DDIMER in the last 72 hours. -------------------------------------------------------------------------------------------------------------------  Cardiac Enzymes No results for input(s): CKMB, TROPONINI, MYOGLOBIN in the last 168 hours.  Invalid input(s): CK ------------------------------------------------------------------------------------------------------------------    Component Value Date/Time   BNP 799.9 (H) 06/20/2016 2019     ---------------------------------------------------------------------------------------------------------------  Urinalysis    Component Value Date/Time   COLORURINE AMBER (A) 06/20/2016 2023   APPEARANCEUR CLEAR 06/20/2016 2023   LABSPEC 1.018 06/20/2016 2023   PHURINE 8.0 06/20/2016 2023   GLUCOSEU 100 (A) 06/20/2016 2023   GLUCOSEU NEGATIVE 02/27/2016 1538   HGBUR NEGATIVE 06/20/2016 2023   BILIRUBINUR NEGATIVE 06/20/2016 2023   KETONESUR NEGATIVE 06/20/2016 2023   PROTEINUR 30 (A) 06/20/2016 2023   UROBILINOGEN 1.0 02/27/2016 1538   NITRITE NEGATIVE 06/20/2016 2023   LEUKOCYTESUR TRACE (A) 06/20/2016 2023    ----------------------------------------------------------------------------------------------------------------   Imaging Results:       Assessment & Plan:    Active Problems:   COPD (chronic obstructive pulmonary disease) (HCC)   GERD   Constipation   Back pain   Atrial fibrillation (HCC)   Closed tibia fracture   Anemia of chronic disease   Lower GI bleed  Lower GI bleed - Patient presents with one episode of bright red blood per rectum, hemoglobin at baseline 8.9, bright red blood on rectal exam, possibly related to diverticulosis versus internal hemorrhoid. - Continue to hold aspirin, monitor  H&H every 8 hours, continue with clear liquid diet pending GI recommendation. - Keep active type and screen - GI consulted by ED  Anemia - Anemia of chronic disease - Currently hemoglobin at baseline, continue to monitor closely and transfuse as needed, Patient required 1 unit transfusion during recent hospital stay postoperatively.  Hyponatremia - Likely related to dehydration and volume depletion, continue gentle hydration recheck BMP in a.m.  Dysuria - Follow on urinalysis  Cardiomyopathy: -  Last echocardiogram 03/10/2016 EF 50- 55% with no significant wall motion abnormalities - Appears to be euvolemic, monitor closely as on gentle hydration  Atrial fibrillation -  CHA2DS2-VASc Score = 3, was on Xarelto which has been stopped during last admission given multiple falls and hemoglobin trending down - Continue to hold aspirin cough and lower GI bleed, - Rate controlled, not on any medications  Chronic kidney disease stage III:  - Creatinine near patient's baseline.  - Continue to monitor  Recent admission with close tibial fracture status post surgical repair - PT consulted  Chronic respiratory failure - At baseline, on 2.5 L nasal cannula  COPD - Continue with when necessary nebs, no active wheezing  GERD - Continue with PPI  History of hypertension - Not on any home medications, actually blood pressure on the soft side   DVT Prophylaxis SCDs  AM Labs Ordered, also please review Full Orders  Family Communication: Admission, patients condition and plan of care including tests being ordered have been discussed with the patient and son and wife who indicate understanding and agree with the plan and Code Status.  Code Status Full code  Likely DC to SNF  Condition GUARDED    Consults called: GI consulted by ED  Admission status: Observation  Time spent in minutes : 55 minutes   Damonique Brunelle M.D on 07/04/2016 at 5:57 PM  Between 7am to 7pm - Pager -  [phone]. After 7pm go to [email or web address] - password [facility]  [facility] - Office  [phone]

## 2016-07-04 NOTE — ED Provider Notes (Signed)
[facility] DEPT Provider Note   CSN: [id number] Arrival date & time: 07/04/16  1448     History   Chief Complaint Chief Complaint  Patient presents with  . GI Bleeding    HPI Sean [name] is a 82 y.o. male.  Sean [name] is a 82 y.o. male with history of alcohol abuse in remission, alcoholic cirrhosis of liver, AR, bladder cancer, cardiomyopathy, diverticulosis of colon, HTN, HLD, OA, PUD, osteoporosis, atrial fibrillation on coumadin, COPD on 2L [place] presents to ED from [facility] with GI bleeding. Facility noted today bright red blood in briefs prompting EMS transfer to ED. Pt currently takes 81mg  ASA, but no other anti-coagulation therapy. He complains of generalized weakness, dysuria, and lightheadedness. He denies fever, abdominal pain, N/V/D, constipation, hematuria, rash, LOC, CP, SOB. He was recently seen in ED for left knee tib/fib fracture.       Past Medical History:  Diagnosis Date  . ABUSE, ALCOHOL, IN REMISSION 06/17/2007  . Adenomatous colon polyp 01/1984  . Alcoholic cirrhosis of liver ([place]) 06/17/2007  . Aortic regurgitation 03/11/2016  . BLADDER CANCER 06/17/2007  . Cardiomyopathy ([place]) 03/11/2016  . CAROTID ARTERY DISEASE 07/23/2009  . COMPRESSION FRACTURE, LUMBAR VERTEBRAE 08/09/2009  . COPD 09/18/2008  . DIVERTICULOSIS, COLON 06/17/2007  . ECHOCARDIOGRAM, ABNORMAL 07/06/2008  . GERD 06/17/2007  . GLUCOSE INTOLERANCE 06/17/2007  . Hiatal hernia   . HYPERLIPIDEMIA 06/17/2007  . HYPERTENSION 06/17/2007  . Impaired glucose tolerance 12/13/2010  . Internal hemorrhoids   . LOW BACK PAIN, CHRONIC 12/17/2009  . OSTEOARTHRITIS, KNEES, BILATERAL 06/19/2009  . OSTEOPOROSIS 06/17/2007  . PEPTIC ULCER DISEASE 06/17/2007  . PSA, INCREASED 09/18/2008  . RENAL INSUFFICIENCY 09/18/2008  . Tubulovillous adenoma 04/2000   Duodenal polyp  . UNSPECIFIED SUDDEN HEARING LOSS 03/20/2009  . VITAMIN D DEFICIENCY 09/18/2008    Patient Active Problem List   Diagnosis  Date Noted  . Lower GI bleed 07/04/2016  . History of open reduction and internal fixation (ORIF) procedure   . Anemia of chronic disease 06/21/2016  . Chronic anticoagulation 06/21/2016  . Closed tibia fracture 06/20/2016  . Closed tibial fracture 06/20/2016  . Cardiomyopathy ([place]) 03/11/2016  . Aortic regurgitation 03/11/2016  . Atrial fibrillation ([place]) 02/27/2016  . Chest pain 08/09/2014  . Back pain 02/01/2013  . Grief reaction 02/01/2013  . Chronic pain 01/30/2012  . Bladder neck obstruction 12/25/2011  . Weight loss 10/10/2011  . Ecchymosis 06/18/2011  . Peripheral neuropathy ([place]) 12/16/2010  . Impaired glucose tolerance 12/13/2010  . Preventative health care 12/13/2010  . LOW BACK PAIN, CHRONIC 12/17/2009  . Constipation 08/09/2009  . COMPRESSION FRACTURE, LUMBAR VERTEBRAE 08/09/2009  . CAROTID ARTERY DISEASE 07/23/2009  . OSTEOARTHRITIS, KNEES, BILATERAL 06/19/2009  . UNSPECIFIED SUDDEN HEARING LOSS 03/20/2009  . VITAMIN D DEFICIENCY 09/18/2008  . COPD (chronic obstructive pulmonary disease) ([place]) 09/18/2008  . CKD (chronic kidney disease) 09/18/2008  . PSA, INCREASED 09/18/2008  . ECHOCARDIOGRAM, ABNORMAL 07/06/2008  . BRADYCARDIA 12/17/2007  . Fatigue 06/18/2007  . BLADDER CANCER 06/17/2007  . Hyperlipemia 06/17/2007  . ABUSE, ALCOHOL, IN REMISSION 06/17/2007  . Essential hypertension 06/17/2007  . GERD 06/17/2007  . PEPTIC ULCER DISEASE 06/17/2007  . DIVERTICULOSIS, COLON 06/17/2007  . Alcoholic cirrhosis of liver ([place]) 06/17/2007  . OSTEOPOROSIS 06/17/2007  . COLONIC POLYPS, HX OF 06/17/2007    Past Surgical History:  Procedure Laterality Date  . CHOLECYSTECTOMY  04/2000  . inguinal herniorrhapy    . LUMBAR LAMINECTOMY     x 2  .  s/p Bilroth II    . s/p bowel obstruction surgury  12/01  . s/p left middle ear surgury  late 19's   Dr. Juanell Fairly  . TIBIA IM NAIL INSERTION Left 06/22/2016   Procedure: INTRAMEDULLARY (IM) NAIL TIBIAL;  Surgeon:  Leandrew Koyanagi, MD;  Location: [facility];  Service: Orthopedics;  Laterality: Left;       Home Medications    Prior to Admission medications   Medication Sig Start Date End Date Taking? Authorizing Provider  aspirin 325 MG tablet Take 1 tablet (325 mg total) by mouth daily. 06/25/16  Yes Eber Jones, MD  calcium carbonate (TUMS - DOSED IN MG ELEMENTAL CALCIUM) 500 MG chewable tablet Chew 2 tablets by mouth daily.   Yes Historical Provider, MD  cholecalciferol (VITAMIN D) 1000 units tablet Take 1,000 Units by mouth daily.   Yes Historical Provider, MD  Multiple Vitamins-Minerals (DECUBI-VITE) CAPS Take 1 capsule by mouth daily.   Yes Historical Provider, MD  omeprazole (PRILOSEC) 20 MG capsule Take 1 capsule (20 mg total) by mouth 2 (two) times daily before a meal. 10/01/15  Yes Biagio Borg, MD  oxyCODONE (ROXICODONE) 5 MG immediate release tablet Take 1 tablet (5 mg total) by mouth every 6 (six) hours as needed. 06/24/16  Yes Theodis Blaze, MD  polyethylene glycol [facility] / Floria Raveling) packet Take 17 g by mouth daily as needed for moderate constipation.   Yes Historical Provider, MD  senna (SENOKOT) 8.6 MG TABS tablet Take 1 tablet (8.6 mg total) by mouth at bedtime as needed for mild constipation. 06/25/16  Yes Eber Jones, MD  UNABLE TO FIND Med Name: Med pass 120 cc 2 times daily between meals   Yes Historical Provider, MD    Family History Family History  Problem Relation Age of Onset  . Diabetes Other     5 siblings  . Heart attack Mother   . Emphysema Father   . Breast cancer Sister     Social History Social History  Substance Use Topics  . Smoking status: Former Smoker    Quit date: 10/09/2004  . Smokeless tobacco: Never Used  . Alcohol use No     Allergies   Amoxicillin-pot clavulanate and Ibuprofen   Review of Systems Review of Systems  Constitutional: Negative for fever.  HENT: Negative for trouble swallowing.   Eyes: Negative for visual  disturbance.  Respiratory: Negative for shortness of breath.   Cardiovascular: Negative for chest pain.  Gastrointestinal: Positive for blood in stool. Negative for abdominal pain, constipation, diarrhea, nausea and vomiting.  Genitourinary: Positive for dysuria. Negative for hematuria.  Musculoskeletal: Positive for arthralgias ( left tib/fib fracture).  Skin: Negative for rash.  Neurological: Positive for weakness ( generalized) and light-headedness. Negative for syncope.     Physical Exam Updated Vital Signs BP 107/60   Pulse (!) 55   Temp 98 F (36.7 C) (Oral)   Resp 18   SpO2 92%   Physical Exam  Constitutional: He appears cachectic. He appears ill ( chronically ill appearing). No distress.  HENT:  Head: Normocephalic and atraumatic.  Mouth/Throat: Uvula is midline and oropharynx is clear and moist. Mucous membranes are dry. He has dentures. No trismus in the jaw. No oropharyngeal exudate.  Eyes: Conjunctivae and EOM are normal. Pupils are equal, round, and reactive to light. Right eye exhibits no discharge. Left eye exhibits no discharge. No scleral icterus.  Neck: Normal range of motion and phonation normal. Neck supple. No neck rigidity.  Normal range of motion present.  Cardiovascular: Normal rate, regular rhythm, normal heart sounds and intact distal pulses.   No murmur heard. Pulmonary/Chest: Effort normal. No stridor. No respiratory distress. He has decreased breath sounds ( diffusely). He has no wheezes. He has no rales.  Abdominal: Soft. Bowel sounds are normal. He exhibits no distension. There is no tenderness. There is no rigidity, no rebound, no guarding and no CVA tenderness.  Genitourinary:  Genitourinary Comments: Chaperone present for duration of exam. No external hemorrhoids or fissures noted. Rectal tone is normal. No tenderness. No masses palpated. stool noted in rectal vault. Bright red blood per rectum.   Musculoskeletal: Normal range of motion.  Left lower  extremity in immobilizer and boot s/p tib/fib fracture.  Lymphadenopathy:    He has no cervical adenopathy.  Neurological: He is alert. He is not disoriented. GCS eye subscore is 4. GCS verbal subscore is 5. GCS motor subscore is 6.  Skin: Skin is warm and dry. He is not diaphoretic.  Psychiatric: He has a normal mood and affect. His behavior is normal.     ED Treatments / Results  Labs (all labs ordered are listed, but only abnormal results are displayed) Labs Reviewed  CBC WITH DIFFERENTIAL/PLATELET - Abnormal; Notable for the following:       Result Value   RBC 2.78 (*)    Hemoglobin 8.9 (*)    HCT 26.1 (*)    Neutro Abs 8.2 (*)    All other components within normal limits  BASIC METABOLIC PANEL - Abnormal; Notable for the following:    Sodium 125 (*)    Chloride 97 (*)    Glucose, Bld 108 (*)    BUN 35 (*)    Calcium 8.4 (*)    All other components within normal limits  URINALYSIS, ROUTINE W REFLEX MICROSCOPIC - Abnormal; Notable for the following:    APPearance HAZY (*)    Leukocytes, UA TRACE (*)    All other components within normal limits  POC OCCULT BLOOD, ED - Abnormal; Notable for the following:    Fecal Occult Bld POSITIVE (*)    All other components within normal limits  PROTIME-INR  TYPE AND SCREEN  ABO/RH    EKG  EKG Interpretation None       Radiology No results found.  Procedures Procedures (including critical care time)  Medications Ordered in ED Medications  pantoprazole (PROTONIX) injection 40 mg (40 mg Intravenous Given 07/04/16 1555)     Initial Impression / Assessment and Plan / ED Course  I have reviewed the triage vital signs and the nursing notes.  Pertinent labs & imaging results that were available during my care of the patient were reviewed by me and considered in my medical decision making (see chart for details).  Clinical Course     Patient presents to ED with complaint of bright red blood per rectum. Patient is afebrile  and non-toxic appearing in NAD. Blood pressures soft. Bradycardic. Bright red blood per rectum on rectal exam Abdomen is soft, non-tender, non-distended. Diminished breath sounds b/l. Heart RRR. Left lower extremity in immobilizer. 40mg  IV protonix given h/o alcohol abuse (in remission) and PUD. Hemoccult positive. Hemoglobin stable compared to previous. PT/INR nml. Hyponatremic and hypochloremic - ?secondary to GI bleed vs. ?dehydration dry mucous membranes. Elevated BUN. Review of records show last EGD in 2013 - s/p bilroth gastrectomy, no evidence of ulcers at that time. Concern GI bleed is diverticular in nature. Will consult [facility]. Consult  to hospitalist for admission for further management of GI bleed. Discussed patient with Dr. Kathrynn Humble, agrees with plan.   5:36 PM: Spoke with Dr. Waldron Labs of [facility], greatly appreciate his time and input, agrees to admit patient for further management of GI bleed likely secondary to diverticular disease.    Final Clinical Impressions(s) / ED Diagnoses   Final diagnoses:  Lower GI bleed    New Prescriptions Current Discharge Medication List       Roxanna Mew, PA-C 07/04/16 [place], MD 07/06/16 [phone]

## 2016-07-04 NOTE — ED Triage Notes (Signed)
Per EMS, pt from [facility]. Staff noticed bright red blood in patients brief today. Pt denies pain. Pt A&Ox4. Pt is on 2L O2 at home. Pt takes coumadin.

## 2016-07-04 NOTE — ED Notes (Signed)
Bed: WA03 Expected date:  Expected time:  Means of arrival:  Comments: 82 yo M gi bleed

## 2016-07-04 NOTE — ED Notes (Signed)
Condom cath placed on pt in order to obtain urine specimen.

## 2016-07-04 NOTE — ED Notes (Signed)
Report given Josph Macho RN [address]

## 2016-07-05 ENCOUNTER — Encounter (HOSPITAL_COMMUNITY): Payer: Self-pay | Admitting: Orthopaedic Surgery

## 2016-07-05 DIAGNOSIS — S82202D Unspecified fracture of shaft of left tibia, subsequent encounter for closed fracture with routine healing: Secondary | ICD-10-CM | POA: Diagnosis not present

## 2016-07-05 DIAGNOSIS — S82192S Other fracture of upper end of left tibia, sequela: Secondary | ICD-10-CM | POA: Diagnosis not present

## 2016-07-05 DIAGNOSIS — E785 Hyperlipidemia, unspecified: Secondary | ICD-10-CM | POA: Diagnosis present

## 2016-07-05 DIAGNOSIS — E860 Dehydration: Secondary | ICD-10-CM | POA: Diagnosis present

## 2016-07-05 DIAGNOSIS — K5730 Diverticulosis of large intestine without perforation or abscess without bleeding: Secondary | ICD-10-CM | POA: Diagnosis not present

## 2016-07-05 DIAGNOSIS — J449 Chronic obstructive pulmonary disease, unspecified: Secondary | ICD-10-CM | POA: Diagnosis present

## 2016-07-05 DIAGNOSIS — I4891 Unspecified atrial fibrillation: Secondary | ICD-10-CM | POA: Diagnosis present

## 2016-07-05 DIAGNOSIS — Z681 Body mass index (BMI) 19 or less, adult: Secondary | ICD-10-CM | POA: Diagnosis not present

## 2016-07-05 DIAGNOSIS — K7030 Alcoholic cirrhosis of liver without ascites: Secondary | ICD-10-CM | POA: Diagnosis present

## 2016-07-05 DIAGNOSIS — K625 Hemorrhage of anus and rectum: Secondary | ICD-10-CM | POA: Diagnosis not present

## 2016-07-05 DIAGNOSIS — M6281 Muscle weakness (generalized): Secondary | ICD-10-CM | POA: Diagnosis not present

## 2016-07-05 DIAGNOSIS — Z7901 Long term (current) use of anticoagulants: Secondary | ICD-10-CM | POA: Diagnosis not present

## 2016-07-05 DIAGNOSIS — I129 Hypertensive chronic kidney disease with stage 1 through stage 4 chronic kidney disease, or unspecified chronic kidney disease: Secondary | ICD-10-CM | POA: Diagnosis present

## 2016-07-05 DIAGNOSIS — K5731 Diverticulosis of large intestine without perforation or abscess with bleeding: Secondary | ICD-10-CM | POA: Diagnosis present

## 2016-07-05 DIAGNOSIS — N183 Chronic kidney disease, stage 3 (moderate): Secondary | ICD-10-CM | POA: Diagnosis present

## 2016-07-05 DIAGNOSIS — K5904 Chronic idiopathic constipation: Secondary | ICD-10-CM | POA: Diagnosis not present

## 2016-07-05 DIAGNOSIS — K922 Gastrointestinal hemorrhage, unspecified: Secondary | ICD-10-CM | POA: Diagnosis not present

## 2016-07-05 DIAGNOSIS — K5909 Other constipation: Secondary | ICD-10-CM | POA: Diagnosis present

## 2016-07-05 DIAGNOSIS — Z87891 Personal history of nicotine dependence: Secondary | ICD-10-CM | POA: Diagnosis not present

## 2016-07-05 DIAGNOSIS — Z8711 Personal history of peptic ulcer disease: Secondary | ICD-10-CM | POA: Diagnosis not present

## 2016-07-05 DIAGNOSIS — Z9981 Dependence on supplemental oxygen: Secondary | ICD-10-CM | POA: Diagnosis not present

## 2016-07-05 DIAGNOSIS — M810 Age-related osteoporosis without current pathological fracture: Secondary | ICD-10-CM | POA: Diagnosis present

## 2016-07-05 DIAGNOSIS — R64 Cachexia: Secondary | ICD-10-CM | POA: Diagnosis present

## 2016-07-05 DIAGNOSIS — J9610 Chronic respiratory failure, unspecified whether with hypoxia or hypercapnia: Secondary | ICD-10-CM | POA: Diagnosis present

## 2016-07-05 DIAGNOSIS — K649 Unspecified hemorrhoids: Secondary | ICD-10-CM | POA: Diagnosis not present

## 2016-07-05 DIAGNOSIS — L8990 Pressure ulcer of unspecified site, unspecified stage: Secondary | ICD-10-CM | POA: Diagnosis present

## 2016-07-05 DIAGNOSIS — R278 Other lack of coordination: Secondary | ICD-10-CM | POA: Diagnosis not present

## 2016-07-05 DIAGNOSIS — I429 Cardiomyopathy, unspecified: Secondary | ICD-10-CM | POA: Diagnosis present

## 2016-07-05 DIAGNOSIS — I482 Chronic atrial fibrillation: Secondary | ICD-10-CM | POA: Diagnosis not present

## 2016-07-05 DIAGNOSIS — Z4789 Encounter for other orthopedic aftercare: Secondary | ICD-10-CM | POA: Diagnosis not present

## 2016-07-05 DIAGNOSIS — R531 Weakness: Secondary | ICD-10-CM | POA: Diagnosis not present

## 2016-07-05 DIAGNOSIS — D638 Anemia in other chronic diseases classified elsewhere: Secondary | ICD-10-CM | POA: Diagnosis not present

## 2016-07-05 DIAGNOSIS — E871 Hypo-osmolality and hyponatremia: Secondary | ICD-10-CM | POA: Diagnosis present

## 2016-07-05 DIAGNOSIS — Z9181 History of falling: Secondary | ICD-10-CM | POA: Diagnosis not present

## 2016-07-05 DIAGNOSIS — K648 Other hemorrhoids: Secondary | ICD-10-CM | POA: Diagnosis present

## 2016-07-05 DIAGNOSIS — Z8551 Personal history of malignant neoplasm of bladder: Secondary | ICD-10-CM | POA: Diagnosis not present

## 2016-07-05 DIAGNOSIS — E43 Unspecified severe protein-calorie malnutrition: Secondary | ICD-10-CM | POA: Diagnosis present

## 2016-07-05 LAB — PROTIME-INR
INR: 1.08
Prothrombin Time: 14 seconds (ref 11.4–15.2)

## 2016-07-05 LAB — CBC
HEMATOCRIT: 27.8 % — AB (ref 39.0–52.0)
HEMOGLOBIN: 9.3 g/dL — AB (ref 13.0–17.0)
MCH: 32.4 pg (ref 26.0–34.0)
MCHC: 33.5 g/dL (ref 30.0–36.0)
MCV: 96.9 fL (ref 78.0–100.0)
Platelets: 288 10*3/uL (ref 150–400)
RBC: 2.87 MIL/uL — AB (ref 4.22–5.81)
RDW: 14.7 % (ref 11.5–15.5)
WBC: 11.7 10*3/uL — AB (ref 4.0–10.5)

## 2016-07-05 LAB — BASIC METABOLIC PANEL
Anion gap: 6 (ref 5–15)
BUN: 30 mg/dL — AB (ref 6–20)
CO2: 25 mmol/L (ref 22–32)
Calcium: 8.1 mg/dL — ABNORMAL LOW (ref 8.9–10.3)
Chloride: 98 mmol/L — ABNORMAL LOW (ref 101–111)
Creatinine, Ser: 0.81 mg/dL (ref 0.61–1.24)
Glucose, Bld: 108 mg/dL — ABNORMAL HIGH (ref 65–99)
POTASSIUM: 4.4 mmol/L (ref 3.5–5.1)
SODIUM: 129 mmol/L — AB (ref 135–145)

## 2016-07-05 LAB — HEMOGLOBIN AND HEMATOCRIT, BLOOD
HCT: 25.9 % — ABNORMAL LOW (ref 39.0–52.0)
Hemoglobin: 8.6 g/dL — ABNORMAL LOW (ref 13.0–17.0)

## 2016-07-05 LAB — ABO/RH: ABO/RH(D): O POS

## 2016-07-05 NOTE — Progress Notes (Addendum)
Note 6 dressing on his left leg which were placed on 07/03/16 with bruising of the left posterior region, as well as, bruising of both upper extremities. Note 1 cm x 0.5 stage 2 ulcer in the sacral region with a yellow base without any drainage covered with an Allyen dressing. He reports numbness of his lower extremities.  Now has   Diarrheal stool upon initial assessment -watery and dark at beginning of shift and bloody stool at ~ 1430.

## 2016-07-05 NOTE — Progress Notes (Addendum)
PROGRESS NOTE                                                                                                                                                                                                             Patient Demographics:    Sean [name], is a 82 y.o. male, DOB - 17-Jan-1925, [id number]  Admit date - 07/04/2016   Admitting Physician Sean Patricia, MD  Outpatient Primary MD for the patient is Sean Cower, MD  LOS - 0  Outpatient Specialists: GI Dr Fuller Plan  Chief Complaint  Patient presents with  . GI Bleeding       Brief Narrative    82 y.o. male, 82 y.o.malewith medical history significant of HTN, HLD, A.Fib( xarelto stopped recently) PVD, COPD, GERD, Recently discharged to SNF from [name] status post surgical repair of left tibial fracture. Patient admitted admitted secondary to hematochezia, recent colonoscopy 2008 significant for polyp/diverticulosis and internal hemorrhoid   Subjective:    Sean [name] today has, No headache, No chest pain, No abdominal pain -patient had one medium-sized bowel movement with significant blood overnight.   Assessment  & Plan :    Active Problems:   COPD (chronic obstructive pulmonary disease) (HCC)   GERD   Constipation   Back pain   Atrial fibrillation (HCC)   Closed tibia fracture   Anemia of chronic disease   Lower GI bleed   Pressure injury of skin   Hematochezia - This is most likely related to diverticular bleed, will continue to hold aspirin, monitor H&H closely, continue with clear liquid diet, continue with IV fluids. - D/W GI, continue with current management.  Anemia - Patient with known history of anemia of chronic disease, with recent acute blood loss anemia postoperatively, hemoglobin remained stable, continue to monitor closely and transfuse as needed.  Cardiomyopathy: -  Last echocardiogram 03/10/2016 EF 50- 55% with no significant wall  motion abnormalities - Appears to be euvolemic, monitor closely as on gentle hydration  Atrial fibrillation - CHA2DS2-VASc Score = 3, was on Xarelto which has been stopped during last admission given multiple falls and hemoglobin trending down - Continue to hold aspirin cough and lower GI bleed, - Rate controlled, not on any medications  Hyponatremia - Secondary to volume depletion, improving on normal saline  Chronic kidney disease stage III:  -  Creatinine near patient's baseline.  - Continue to monitor  Recent admission with close tibial fracture status post surgical repair - PT consulted  Chronic respiratory failure - At baseline, on 2.5 L nasal cannula  COPD - Continue with when necessary nebs, no active wheezing  GERD - Continue with PPI  History of hypertension - Not on any home medications, actually blood pressure on the soft side  Pressure ulcer - Continue with wound care  Code Status : Full  Family Communication  : discussed with son via phone 12/9  Disposition Plan  : back to SNF when stable  Consults  :  GI  Procedures  : none  DVT Prophylaxis  :  SCDs   Lab Results  Component Value Date   PLT 288 07/05/2016    Antibiotics  :    Anti-infectives    None        Objective:   Vitals:   07/04/16 1456 07/04/16 1913 07/05/16 0431  BP: 107/60 (!) 131/51 (!) 127/59  Pulse: (!) 55 74 77  Resp: 18 16 18   Temp: 98 F (36.7 C) 97.5 F (36.4 C) 98.3 F (36.8 C)  TempSrc: Oral Oral Oral  SpO2: 92% 97% 97%  Weight:  48 kg (105 lb 13.1 oz)   Height:  5\' 10"  (1.778 m)     Wt Readings from Last 3 Encounters:  07/04/16 48 kg (105 lb 13.1 oz)  06/30/16 40.8 kg (90 lb)  06/26/16 42 kg (92 lb 11.2 oz)     Intake/Output Summary (Last 24 hours) at 07/05/16 0919 Last data filed at 07/05/16 0532  Gross per 24 hour  Intake           665.83 ml  Output              350 ml  Net           315.83 ml     Physical Exam  Awake Alert, Oriented  X 3,  Supple Neck,No JVD,  Symmetrical Chest wall movement, Good air movement bilaterally, CTAB IRR IRR, No Gallops,Rubs or new Murmurs, No Parasternal Heave +ve B.Sounds, Abd Soft, No tenderness, No rebound - guarding or rigidity. No Cyanosis, Clubbing or edema,left lower extremity was significant bruising secondary to recent fall and fracture    Data Review:    CBC  Recent Labs Lab 07/04/16 1521 07/04/16 1957 07/05/16 0405  WBC 10.1  --  11.7*  HGB 8.9* 9.0* 9.3*  HCT 26.1* 26.3* 27.8*  PLT 268  --  288  MCV 93.9  --  96.9  MCH 32.0  --  32.4  MCHC 34.1  --  33.5  RDW 14.7  --  14.7  LYMPHSABS 1.0  --   --   MONOABS 0.8  --   --   EOSABS 0.1  --   --   BASOSABS 0.0  --   --     Chemistries   Recent Labs Lab 07/04/16 1521 07/05/16 0405  NA 125* 129*  K 4.4 4.4  CL 97* 98*  CO2 22 25  GLUCOSE 108* 108*  BUN 35* 30*  CREATININE 0.86 0.81  CALCIUM 8.4* 8.1*   ------------------------------------------------------------------------------------------------------------------ No results for input(s): CHOL, HDL, LDLCALC, TRIG, CHOLHDL, LDLDIRECT in the last 72 hours.  Lab Results  Component Value Date   HGBA1C 5.7 08/24/2015   ------------------------------------------------------------------------------------------------------------------ No results for input(s): TSH, T4TOTAL, T3FREE, THYROIDAB in the last 72 hours.  Invalid input(s): FREET3 ------------------------------------------------------------------------------------------------------------------ No results for input(s): VITAMINB12, FOLATE,  FERRITIN, TIBC, IRON, RETICCTPCT in the last 72 hours.  Coagulation profile  Recent Labs Lab 07/04/16 1521 07/05/16 0405  INR 1.07 1.08    No results for input(s): DDIMER in the last 72 hours.  Cardiac Enzymes No results for input(s): CKMB, TROPONINI, MYOGLOBIN in the last 168 hours.  Invalid input(s):  CK ------------------------------------------------------------------------------------------------------------------    Component Value Date/Time   BNP 799.9 (H) 06/20/2016 2019    Inpatient Medications  Scheduled Meds: . calcium carbonate  2 tablet Oral Daily  . cholecalciferol  1,000 Units Oral Daily  . multivitamin-lutein  1 capsule Oral Daily  . pantoprazole  40 mg Oral Daily   Continuous Infusions: . sodium chloride 50 mL/hr at 07/05/16 0532   PRN Meds:.albuterol, oxyCODONE, polyethylene glycol, senna  Micro Results No results found for this or any previous visit (from the past 240 hour(s)).  Radiology Reports Dg Chest 1 View  Result Date: 06/20/2016 CLINICAL DATA:  82 year old male status post fall today. Initial encounter. EXAM: CHEST 1 VIEW COMPARISON:  02/27/2016, levin 11/15/2007. FINDINGS: Right upper extremity artifact projecting over the diaphragm. Cardiac silhouette has increased on this PA view compared to August. Other mediastinal contours are within normal limits. Chronic hyperinflation. No pneumothorax, pulmonary edema, pleural effusion or confluent pulmonary opacity. EKG button artifact projecting in the right upper lung. Osteopenia. Previously augmented upper lumbar compression fracture. No acute osseous abnormality identified. IMPRESSION: 1. Cardiac silhouette appears mildly increased since August. Consider pericardial effusion. 2. No other acute cardiopulmonary abnormality. Chronic hyperinflation. 3. Right upper extremity artifact projecting over the diaphragm. Electronically Signed   By: Genevie Ann M.D.   On: 06/20/2016 21:10   Dg Knee 2 Views Left  Result Date: 06/20/2016 CLINICAL DATA:  Patient fell today. Swelling in the left knee. Lower leg feels numb. Not able to straighten knee. Severe pain. EXAM: LEFT KNEE - 1-2 VIEW COMPARISON:  None. FINDINGS: Diffuse bone demineralization. Postoperative changes with hardware fixation in the femoral metaphysis,  incompletely included within the field of view. Oblique probably comminuted fractures of the left proximal tibial meta diaphysis with about 7 mm lateral displacement of the distal fracture fragment. Alignment appears intact. Slight cortical irregularity at the junction of the proximal fibular head neck is incompletely visualized but could also represent fibular fracture. Soft tissue swelling. Prominent degenerative changes in the left knee with chondrocalcinosis and small osteochondral defect in the left medial femoral condyle. No significant effusion. Vascular calcifications. IMPRESSION: Diffuse bone demineralization with acute mildly displaced fracture of the proximal left tibia at the junction of the metaphysis and shaft. Probable nondisplaced fracture of the proximal left fibula. Electronically Signed   By: Lucienne Capers M.D.   On: 06/20/2016 21:10   Dg Tibia/fibula Left  Result Date: 06/22/2016 CLINICAL DATA:  Post left tibial fracture repair EXAM: DG C-ARM 61-120 MIN; LEFT TIBIA AND FIBULA - 2 VIEW COMPARISON:  06/20/2016 FINDINGS: Seven views of the left tibia and fibula submitted. The patient is status post intraoperative repair of proximal left tibial fracture. There is intra medullary rod and 3 metallic fixation screws are noted in proximal left tibia. Two metallic fixation screws are noted in and distal tibia. There is improvement in alignment. Again noted minimal displaced fracture in proximal left fibula with improvement in alignment. IMPRESSION: The patient is status post intraoperative repair of proximal left tibial fracture. There is intra medullary rod and 3 metallic fixation screws are noted in proximal left tibia. Two metallic fixation screws are noted in and distal tibia. There  is improvement in alignment. Again noted minimal displaced fracture in proximal left fibula with improvement in alignment. Fluoroscopy time was 2 minutes 25 seconds. Please see the operative report. Electronically  Signed   By: Lahoma Crocker M.D.   On: 06/22/2016 13:32   Dg Tibia/fibula Left  Result Date: 06/20/2016 CLINICAL DATA:  [place] today with swelling in the left knee. Pain and limited range of motion. EXAM: LEFT TIBIA AND FIBULA - 2 VIEW COMPARISON:  None. FINDINGS: Diffuse bone demineralization. Acute oblique fracture of the proximal left tibia at the junction of the metaphysis and shaft. There is about 7 mm lateral displacement of the distal fracture fragment. Alignment is intact. Midshaft and distal tibia appear intact. Limited visualization of the proximal fibula. Possible fracture of the proximal fibula is not well evaluated on this study. See additional report of the left knee. Degenerative changes in the left knee and left ankle. Vascular calcifications throughout. IMPRESSION: Oblique fracture of the proximal left tibia with mild lateral displacement of the distal fracture fragment. Electronically Signed   By: Lucienne Capers M.D.   On: 06/20/2016 21:13   Dg Tibia/fibula Left Port  Result Date: 06/22/2016 CLINICAL DATA:  Status post ORIF of tibial fracture EXAM: PORTABLE LEFT TIBIA AND FIBULA - 2 VIEW COMPARISON:  06/20/2016 FINDINGS: Postsurgical changes are again seen in the distal femur stable from prior exam. A new tibial medullary rod with proximal and distal fixation screws is noted. The fracture fragments are in near anatomic alignment. IMPRESSION: Status post ORIF of tibial fracture. Electronically Signed   By: Inez Catalina M.D.   On: 06/22/2016 12:47   Dg C-arm 1-60 Min  Result Date: 06/22/2016 CLINICAL DATA:  Post left tibial fracture repair EXAM: DG C-ARM 61-120 MIN; LEFT TIBIA AND FIBULA - 2 VIEW COMPARISON:  06/20/2016 FINDINGS: Seven views of the left tibia and fibula submitted. The patient is status post intraoperative repair of proximal left tibial fracture. There is intra medullary rod and 3 metallic fixation screws are noted in proximal left tibia. Two metallic fixation screws are  noted in and distal tibia. There is improvement in alignment. Again noted minimal displaced fracture in proximal left fibula with improvement in alignment. IMPRESSION: The patient is status post intraoperative repair of proximal left tibial fracture. There is intra medullary rod and 3 metallic fixation screws are noted in proximal left tibia. Two metallic fixation screws are noted in and distal tibia. There is improvement in alignment. Again noted minimal displaced fracture in proximal left fibula with improvement in alignment. Fluoroscopy time was 2 minutes 25 seconds. Please see the operative report. Electronically Signed   By: Lahoma Crocker M.D.   On: 06/22/2016 13:32      Cleatis Fandrich M.D on 07/05/2016 at 9:19 AM  Between 7am to 7pm - Pager - [phone]  After 7pm go to [email or web address] - password [facility]  [facility] -  Office  [phone]

## 2016-07-05 NOTE — Evaluation (Signed)
Physical Therapy Evaluation Patient Details Name: Sean [name] MRN: [id number] DOB: 06-15-1925 Today's Date: 07/05/2016   History of Present Illness  Pt admitted from SNF with GIB.  Pt at SNF for rehab following L tibia fx and IM nailing.  Pt with hx of COPD, lumbar compression fx, OA, osteoporosis, A-fib, bladder CA and cardiomyopathy  Clinical Impression  Pt admitted as above and presenting with functional mobility limitations 2* generalized weakness, pain, balance deficits and NWB status on L LE.    Follow Up Recommendations SNF    Equipment Recommendations  None recommended by PT    Recommendations for Other Services       Precautions / Restrictions Precautions Precautions: Fall Precaution Comments: Multiple recent falls Required Braces or Orthoses: Other Brace/Splint Knee Immobilizer - Left: On when out of bed or walking Other Brace/Splint: Cam boot on R Restrictions Weight Bearing Restrictions: Yes LLE Weight Bearing: Non weight bearing      Mobility  Bed Mobility Overal bed mobility: Needs Assistance Bed Mobility: Supine to Sit;Sit to Supine     Supine to sit: Mod assist Sit to supine: Mod assist   General bed mobility comments: Pt required assist for B LEs into and out of bed.  Pt with poor trunk control and required assist to steady his balance.  pt sat edge of bed x 15 min.    Transfers Overall transfer level: Needs assistance Equipment used: Rolling walker (2 wheeled) Transfers: Sit to/from Stand Sit to Stand: Mod assist;+2 physical assistance Stand pivot transfers: Mod assist;+2 physical assistance       General transfer comment: Pt from elevated surface required +2 mod to boost into standing and maintain LLE TDWB.  Stand pvtto/from BSC  Ambulation/Gait             General Gait Details: Stand/pvt transfers only 2* ltd ability to maintain NWB on L with attempts to step  Stairs            Wheelchair Mobility    Modified Rankin  (Stroke Patients Only)       Balance Overall balance assessment: Needs assistance Sitting-balance support: Feet supported Sitting balance-Leahy Scale: Fair     Standing balance support: Bilateral upper extremity supported Standing balance-Leahy Scale: Poor Standing balance comment: Pt required min assist to maintain balance with RW. Pt limited by NWB status.                              Pertinent Vitals/Pain Pain Assessment: Faces Faces Pain Scale: Hurts little more Pain Location: L knee Pain Descriptors / Indicators: Aching;Sore;Discomfort Pain Intervention(s): Limited activity within patient's tolerance;Monitored during session;Premedicated before session    Home Living Family/patient expects to be discharged to:: Skilled nursing facility                      Prior Function Level of Independence: Needs assistance         Comments: Pt in rehab at SNF following fall and tibia fx     Hand Dominance   Dominant Hand: Right    Extremity/Trunk Assessment   Upper Extremity Assessment: Generalized weakness           Lower Extremity Assessment: Generalized weakness      Cervical / Trunk Assessment: Kyphotic  Communication   Communication: HOH  Cognition Arousal/Alertness: Awake/alert Behavior During Therapy: WFL for tasks assessed/performed Overall Cognitive Status: Within Functional Limits for tasks assessed  General Comments      Exercises     Assessment/Plan    PT Assessment Patient needs continued PT services  PT Problem List Decreased strength;Decreased activity tolerance;Decreased balance;Decreased mobility;Decreased knowledge of use of DME;Decreased safety awareness;Pain          PT Treatment Interventions DME instruction;Gait training;Functional mobility training;Therapeutic exercise;Therapeutic activities;Patient/family education    PT Goals (Current goals can be found in the Care Plan  section)  Acute Rehab PT Goals Patient Stated Goal: Regain IND PT Goal Formulation: With patient Time For Goal Achievement: 07/07/16 Potential to Achieve Goals: Fair    Frequency Min 3X/week   Barriers to discharge Decreased caregiver support      Co-evaluation               End of Session Equipment Utilized During Treatment: Gait belt Activity Tolerance: Patient limited by fatigue Patient left: in bed;with call bell/phone within reach;with bed alarm set Nurse Communication: Mobility status    Functional Assessment Tool Used: Clinical judgement Functional Limitation: Mobility: Walking and moving around Mobility: Walking and Moving Around Current Status [id number]): At least 60 percent but less than 80 percent impaired, limited or restricted Mobility: Walking and Moving Around Goal Status [phone]): At least 40 percent but less than 60 percent impaired, limited or restricted    Time: 0906-0952 PT Time Calculation (min) (ACUTE ONLY): 46 min   Charges:   PT Evaluation $PT Eval Low Complexity: 1 Procedure PT Treatments $Therapeutic Activity: 23-37 mins   PT G Codes:   PT G-Codes **NOT FOR INPATIENT CLASS** Functional Assessment Tool Used: Clinical judgement Functional Limitation: Mobility: Walking and moving around Mobility: Walking and Moving Around Current Status [id number]): At least 60 percent but less than 80 percent impaired, limited or restricted Mobility: Walking and Moving Around Goal Status [phone]): At least 40 percent but less than 60 percent impaired, limited or restricted    Sharifah Champine 07/05/2016, 3:30 PM

## 2016-07-05 NOTE — Consult Note (Signed)
Cross cover [facility] Reason for Consult: Rectal bleeding. Referring Physician: [facility]  Sean [name] is an 82 y.o. male.  HPI: Sean [name] is a 82 year old white male with multiple medical problems who is at the rehabilitation center after surgical repair of the left tibial fracture. Patient has a history of atrial fibrillation requiring 02 which had been stopped after surgery due to his dropping drop in hemoglobin and recent falls. Patient was sent to [facility] after the nursing scaly staff found him bleeding from the rectum. He has been followed by Dr. Lucio Edward ad lib. our GI and has a history of diverticulosis. Patient was unable to give me much in the way of history but his son and wife at the bedside who gave me the details on his history. Patient denies having abdominal pain nausea vomiting. He has been having problem with constipation and has been straining a lot in the recent past. He has also been very fatigued and tired. He had a colonoscopy in 2008 that revealed internal hemorrhoids chronic polyps removed and diverticulosis was noted he had EGD in 2013 that was essentially normal except for a Billroth II postoperative changes noted on endoscopy.  Past Medical History:  Diagnosis Date  . ABUSE, ALCOHOL, IN REMISSION 06/17/2007  . Adenomatous colon polyp 01/1984  . Alcoholic cirrhosis of liver ([place]) 06/17/2007  . Aortic regurgitation 03/11/2016  . BLADDER CANCER 06/17/2007  . Cardiomyopathy ([place]) 03/11/2016  . CAROTID ARTERY DISEASE 07/23/2009  . COMPRESSION FRACTURE, LUMBAR VERTEBRAE 08/09/2009  . COPD 09/18/2008  . DIVERTICULOSIS, COLON 06/17/2007  . ECHOCARDIOGRAM, ABNORMAL 07/06/2008  . GERD 06/17/2007  . GLUCOSE INTOLERANCE 06/17/2007  . Hiatal hernia   . HYPERLIPIDEMIA 06/17/2007  . HYPERTENSION 06/17/2007  . Impaired glucose tolerance 12/13/2010  . Internal hemorrhoids   . LOW BACK PAIN, CHRONIC 12/17/2009  . OSTEOARTHRITIS,  KNEES, BILATERAL 06/19/2009  . OSTEOPOROSIS 06/17/2007  . PEPTIC ULCER DISEASE 06/17/2007  . PSA, INCREASED 09/18/2008  . RENAL INSUFFICIENCY 09/18/2008  . Tubulovillous adenoma 04/2000   Duodenal polyp  . UNSPECIFIED SUDDEN HEARING LOSS 03/20/2009  . VITAMIN D DEFICIENCY 09/18/2008   Past Surgical History:  Procedure Laterality Date  . CHOLECYSTECTOMY  04/2000  . inguinal herniorrhapy    . LUMBAR LAMINECTOMY     x 2  . s/p Bilroth II    . s/p bowel obstruction surgury  12/01  . s/p left middle ear surgury  late 17's   Dr. Juanell Fairly  . TIBIA IM NAIL INSERTION Left 06/22/2016   Procedure: INTRAMEDULLARY (IM) NAIL TIBIAL;  Surgeon: Leandrew Koyanagi, MD;  Location: [facility];  Service: Orthopedics;  Laterality: Left;   Family History  Problem Relation Age of Onset  . Diabetes Other     5 siblings  . Heart attack Mother   . Emphysema Father   . Breast cancer Sister    Social History:  reports that he quit smoking about 11 years ago. He has never used smokeless tobacco. He reports that he does not drink alcohol or use drugs.  Allergies:  Allergies  Allergen Reactions  . Amoxicillin-Pot Clavulanate     REACTION: rash  . Ibuprofen     REACTION: itching   Medications: I have reviewed the patient's current medications.  Results for orders placed or performed during the hospital encounter of 07/04/16 (from the past 48 hour(s))  CBC with Differential     Status: Abnormal   Collection Time: 07/04/16  3:21 PM  Result Value  Ref Range   WBC 10.1 4.0 - 10.5 K/uL   RBC 2.78 (L) 4.22 - 5.81 MIL/uL   Hemoglobin 8.9 (L) 13.0 - 17.0 g/dL   HCT 26.1 (L) 39.0 - 52.0 %   MCV 93.9 78.0 - 100.0 fL   MCH 32.0 26.0 - 34.0 pg   MCHC 34.1 30.0 - 36.0 g/dL   RDW 14.7 11.5 - 15.5 %   Platelets 268 150 - 400 K/uL   Neutrophils Relative % 81 %   Neutro Abs 8.2 (H) 1.7 - 7.7 K/uL   Lymphocytes Relative 10 %   Lymphs Abs 1.0 0.7 - 4.0 K/uL   Monocytes Relative 8 %   Monocytes Absolute 0.8 0.1 - 1.0  K/uL   Eosinophils Relative 1 %   Eosinophils Absolute 0.1 0.0 - 0.7 K/uL   Basophils Relative 0 %   Basophils Absolute 0.0 0.0 - 0.1 K/uL  Protime-INR     Status: None   Collection Time: 07/04/16  3:21 PM  Result Value Ref Range   Prothrombin Time 13.9 11.4 - 15.2 seconds   INR 6.83   Basic metabolic panel     Status: Abnormal   Collection Time: 07/04/16  3:21 PM  Result Value Ref Range   Sodium 125 (L) 135 - 145 mmol/L   Potassium 4.4 3.5 - 5.1 mmol/L   Chloride 97 (L) 101 - 111 mmol/L   CO2 22 22 - 32 mmol/L   Glucose, Bld 108 (H) 65 - 99 mg/dL   BUN 35 (H) 6 - 20 mg/dL   Creatinine, Ser 0.86 0.61 - 1.24 mg/dL   Calcium 8.4 (L) 8.9 - 10.3 mg/dL   GFR calc non Af Amer >60 >60 mL/min   GFR calc Af Amer >60 >60 mL/min    Comment: (NOTE) The eGFR has been calculated using the CKD EPI equation. This calculation has not been validated in all clinical situations. eGFR's persistently <60 mL/min signify possible Chronic Kidney Disease.    Anion gap 6 5 - 15  POC occult blood, ED Provider will collect     Status: Abnormal   Collection Time: 07/04/16  3:32 PM  Result Value Ref Range   Fecal Occult Bld POSITIVE (A) NEGATIVE  Type and screen [place]     Status: None   Collection Time: 07/04/16  3:33 PM  Result Value Ref Range   ABO/RH(D) O POS    Antibody Screen NEG    Sample Expiration 07/07/2016   ABO/Rh     Status: None   Collection Time: 07/04/16  3:33 PM  Result Value Ref Range   ABO/RH(D) O POS   Urinalysis, Routine w reflex microscopic     Status: Abnormal   Collection Time: 07/04/16  5:34 PM  Result Value Ref Range   Color, Urine YELLOW YELLOW   APPearance HAZY (A) CLEAR   Specific Gravity, Urine 1.019 1.005 - 1.030   pH 7.0 5.0 - 8.0   Glucose, UA NEGATIVE NEGATIVE mg/dL   Hgb urine dipstick NEGATIVE NEGATIVE   Bilirubin Urine NEGATIVE NEGATIVE   Ketones, ur NEGATIVE NEGATIVE mg/dL   Protein, ur NEGATIVE NEGATIVE mg/dL   Nitrite NEGATIVE  NEGATIVE   Leukocytes, UA TRACE (A) NEGATIVE   RBC / HPF 0-5 0 - 5 RBC/hpf   WBC, UA 0-5 0 - 5 WBC/hpf   Bacteria, UA NONE SEEN NONE SEEN   Squamous Epithelial / LPF NONE SEEN NONE SEEN   Mucous PRESENT    Hyaline Casts, UA  PRESENT   Hemoglobin and hematocrit, blood     Status: Abnormal   Collection Time: 07/04/16  7:57 PM  Result Value Ref Range   Hemoglobin 9.0 (L) 13.0 - 17.0 g/dL   HCT 26.3 (L) 39.0 - 93.5 %  Basic metabolic panel     Status: Abnormal   Collection Time: 07/05/16  4:05 AM  Result Value Ref Range   Sodium 129 (L) 135 - 145 mmol/L   Potassium 4.4 3.5 - 5.1 mmol/L   Chloride 98 (L) 101 - 111 mmol/L   CO2 25 22 - 32 mmol/L   Glucose, Bld 108 (H) 65 - 99 mg/dL   BUN 30 (H) 6 - 20 mg/dL   Creatinine, Ser 0.81 0.61 - 1.24 mg/dL   Calcium 8.1 (L) 8.9 - 10.3 mg/dL   GFR calc non Af Amer >60 >60 mL/min   GFR calc Af Amer >60 >60 mL/min    Comment: (NOTE) The eGFR has been calculated using the CKD EPI equation. This calculation has not been validated in all clinical situations. eGFR's persistently <60 mL/min signify possible Chronic Kidney Disease.    Anion gap 6 5 - 15  CBC     Status: Abnormal   Collection Time: 07/05/16  4:05 AM  Result Value Ref Range   WBC 11.7 (H) 4.0 - 10.5 K/uL   RBC 2.87 (L) 4.22 - 5.81 MIL/uL   Hemoglobin 9.3 (L) 13.0 - 17.0 g/dL   HCT 27.8 (L) 39.0 - 52.0 %   MCV 96.9 78.0 - 100.0 fL   MCH 32.4 26.0 - 34.0 pg   MCHC 33.5 30.0 - 36.0 g/dL   RDW 14.7 11.5 - 15.5 %   Platelets 288 150 - 400 K/uL  Protime-INR     Status: None   Collection Time: 07/05/16  4:05 AM  Result Value Ref Range   Prothrombin Time 14.0 11.4 - 15.2 seconds   INR 1.08    Review of Systems  Constitutional: Positive for malaise/fatigue. Negative for chills, diaphoresis, fever and weight loss.  Respiratory: Negative.   Cardiovascular: Negative.   Gastrointestinal: Positive for blood in stool and constipation. Negative for diarrhea.  Skin: Negative.    Neurological: Positive for weakness.   Blood pressure (!) 127/59, pulse 77, temperature 98.3 F (36.8 C), temperature source Oral, resp. rate 18, height _0  (1.778 m), weight 48 kg (105 lb 13.1 oz), SpO2 97 %. Physical Exam  Constitutional: He is oriented to person, place, and time. He appears lethargic. He appears cachectic. He is cooperative.  Cardiovascular: An irregularly irregular rhythm present.  Respiratory: Effort normal and breath sounds normal.  GI: Soft. Bowel sounds are normal. He exhibits no distension and no mass. There is no tenderness. There is no rebound and no guarding.  Neurological: He is oriented to person, place, and time. He appears lethargic.  Skin: Skin is warm and dry.   Assessment/Plan: 1) Lower GI bleed/Constipation-probably diverticular bleed. His hemoglobin seems to be stable we'll continue to monitor him for now. 2) GERD on PPI's. 3) Anemia of chronic disease. 4) Closed tibial fracture.  5) COPD.  Orena Cavazos 07/05/2016, 10:21 AM

## 2016-07-06 LAB — BASIC METABOLIC PANEL
ANION GAP: 9 (ref 5–15)
BUN: 25 mg/dL — AB (ref 6–20)
CALCIUM: 8 mg/dL — AB (ref 8.9–10.3)
CO2: 20 mmol/L — ABNORMAL LOW (ref 22–32)
CREATININE: 0.81 mg/dL (ref 0.61–1.24)
Chloride: 100 mmol/L — ABNORMAL LOW (ref 101–111)
GFR calc Af Amer: >60 mL/min (ref >60)
GLUCOSE: 90 mg/dL (ref 65–99)
Potassium: 4 mmol/L (ref 3.5–5.1)
Sodium: 129 mmol/L — ABNORMAL LOW (ref 135–145)

## 2016-07-06 LAB — CBC
HCT: 25.6 % — ABNORMAL LOW (ref 39.0–52.0)
Hemoglobin: 8.6 g/dL — ABNORMAL LOW (ref 13.0–17.0)
MCH: 31.3 pg (ref 26.0–34.0)
MCHC: 33.6 g/dL (ref 30.0–36.0)
MCV: 93.1 fL (ref 78.0–100.0)
Platelets: 242 K/uL (ref 150–400)
RBC: 2.75 MIL/uL — ABNORMAL LOW (ref 4.22–5.81)
RDW: 14.6 % (ref 11.5–15.5)
WBC: 8.5 K/uL (ref 4.0–10.5)

## 2016-07-06 LAB — HEMOGLOBIN AND HEMATOCRIT, BLOOD
HCT: 26.8 % — ABNORMAL LOW (ref 39.0–52.0)
Hemoglobin: 9.1 g/dL — ABNORMAL LOW (ref 13.0–17.0)

## 2016-07-06 MED ORDER — ENSURE ENLIVE PO LIQD
237.0000 mL | Freq: Three times a day (TID) | ORAL | Status: DC
  Administered 2016-07-07: 237 mL via ORAL

## 2016-07-06 MED ORDER — ENSURE ENLIVE PO LIQD: 237.0000 mL | Freq: Two times a day (BID) | ORAL | Status: DC

## 2016-07-06 MED ORDER — POLYETHYLENE GLYCOL 3350 17 G PO PACK
17.0000 g | PACK | Freq: Two times a day (BID) | ORAL | Status: DC
  Administered 2016-07-06: 17 g via ORAL
  Filled 2016-07-06: qty 1

## 2016-07-06 NOTE — Progress Notes (Signed)
Initial Nutrition Assessment  DOCUMENTATION CODES:   Severe malnutrition in context of chronic illness, Underweight  INTERVENTION:   Continue Ensure Enlive po TID, each supplement provides 350 kcal and 20 grams of protein Encourage PO intake RD to continue to monitor  NUTRITION DIAGNOSIS:   Malnutrition related to chronic illness as evidenced by severe depletion of body fat, severe depletion of muscle mass.  GOAL:   Patient will meet greater than or equal to 90% of their needs  MONITOR:   PO intake, Supplement acceptance, Labs, Weight trends, Skin, I & O's  REASON FOR ASSESSMENT:   Malnutrition Screening Tool    ASSESSMENT:   82 y.o. male with medical history significant of HTN, HLD, A.Fib ( xarelto stopped recently) PVD, COPD, GERD, Recently discharged to SNF from [facility] status post surgical repair of left tibial fracture.  Patient in room with no family at bedside. Pt reports drinking his liquids with no issue. States he does not have difficulty chewing if he has his dentures in. Pt has been consuming 50-75% of clear liquids, now on full liquids. Pt states he likes yogurt and drinks Ensure supplements at facility. Pt is agreeable to receiving Ensures during this admission, chocolate flavor.   Per chart review, pt has lost weight over the past year but started gaining weight recently. Overall, weight loss of 12 lb since January 2017 (10% wt loss x 11 months, insignificant for time frame). Nutrition-Focused physical exam completed. Findings are severe fat depletion, severe muscle depletion, and no edema.   Medications: Tums tablet daily, Vitamin D tablet daily, Ocuvite-Lutein capsule daily, Protonix tablet daily, Miralax BID Labs reviewed: Low Na  Diet Order:  Diet full liquid Room service appropriate? Yes; Fluid consistency: Thin  Skin:  Wound (see comment) (Stage II sacral wound, L leg incision)  Last BM:  12/10  Height:   Ht Readings from Last 1  Encounters:  07/04/16 5\' 10"  (1.778 m)    Weight:   Wt Readings from Last 1 Encounters:  07/06/16 103 lb 9.9 oz (47 kg)    Ideal Body Weight:  75.5 kg  BMI:  Body mass index is 14.87 kg/m.  Estimated Nutritional Needs:   Kcal:  1200-1400  Protein:  70-80g  Fluid:  1.4L/day  EDUCATION NEEDS:   No education needs identified at this time  Clayton Bibles, MS, RD, LDN Pager: [phone] After Hours Pager: [phone]

## 2016-07-06 NOTE — Progress Notes (Signed)
Cross cover [facility] Subjective: Since I last evaluated the patient, he seems to be doing fairly well. Small amount of blood in the stool ?old heme-his hemoglobin has been stable.   Objective: Vital signs in last 24 hours: Temp:  [97.6 F (36.4 C)-98.3 F (36.8 C)] 97.9 F (36.6 C) (12/10 0441) Pulse Rate:  [60-74] 60 (12/10 0441) Resp:  [16-18] 16 (12/10 0441) BP: (109-129)/(44-55) 123/55 (12/10 0441) SpO2:  [97 %-100 %] 97 % (12/10 0441) Weight:  [47 kg (103 lb 9.9 oz)] 47 kg (103 lb 9.9 oz) (12/10 0457) Last BM Date: 07/04/16  Intake/Output from previous day: 12/09 0701 - 12/10 0700 In: 2010.8 [P.O.:780; I.V.:1230.8] Out: 1000 [Urine:1000] Intake/Output this shift: No intake/output data recorded.  General appearance: cooperative, appears stated age, fatigued, no distress and pale Resp: clear to auscultation bilaterally Cardio: regular rate and rhythm, S1, S2 normal, no murmur, click, rub or gallop GI: soft, non-tender; bowel sounds normal; no masses,  no organomegaly  Lab Results:  Recent Labs  07/04/16 1521  07/05/16 0405 07/05/16 1331 07/06/16 0355  WBC 10.1  --  11.7*  --  8.5  HGB 8.9*  < > 9.3* 8.6* 8.6*  HCT 26.1*  < > 27.8* 25.9* 25.6*  PLT 268  --  288  --  242  < > = values in this interval not displayed. BMET  Recent Labs  07/04/16 1521 07/05/16 0405 07/06/16 0355  NA 125* 129* 129*  K 4.4 4.4 4.0  CL 97* 98* 100*  CO2 22 25 20*  GLUCOSE 108* 108* 90  BUN 35* 30* 25*  CREATININE 0.86 0.81 0.81  CALCIUM 8.4* 8.1* 8.0*   LFT No results for input(s): PROT, ALBUMIN, AST, ALT, ALKPHOS, BILITOT, BILIDIR, IBILI in the last 72 hours. PT/INR  Recent Labs  07/04/16 1521 07/05/16 0405  LABPROT 13.9 14.0  INR 1.07 1.08   Medications: I have reviewed the patient's current medications.  Assessment/Plan: 1) GI bleed/Chronic constipation-possible due to diverticular disease. Continue to monitor 2) Jerrye Bushy on PPI. Marland Kitchen 3) Anemia of chronic disease. 4)  Closed tibial fracture.  5) COPD.   LOS: 1 day   Tomi Paddock 07/06/2016, 9:29 AM

## 2016-07-06 NOTE — Progress Notes (Signed)
PROGRESS NOTE                                                                                                                                                                                                             Patient Demographics:    Sean [name], is a 82 y.o. male, DOB - 11/11/1924, [id number]  Admit date - 07/04/2016   Admitting Physician Albertine Patricia, MD  Outpatient Primary MD for the patient is Sean Cower, MD  LOS - 1  Outpatient Specialists: GI Dr Fuller Plan  Chief Complaint  Patient presents with  . GI Bleeding       Brief Narrative    82 y.o. male, 82 y.o.malewith medical history significant of HTN, HLD, A.Fib( xarelto stopped recently) PVD, COPD, GERD, Recently discharged to SNF from [facility] status post surgical repair of left tibial fracture. Patient admitted admitted secondary to hematochezia, recent colonoscopy 2008 significant for polyp/diverticulosis and internal hemorrhoid   Subjective:    [name] today has, No headache, No chest pain, No abdominal pain -Patient had total of 4 bowel movements over last 24 hours with moderate amount lobe in stool.  Assessment  & Plan :    Active Problems:   COPD (chronic obstructive pulmonary disease) (HCC)   GERD   Constipation   Back pain   Atrial fibrillation (HCC)   Closed tibia fracture   Anemia of chronic disease   Lower GI bleed   Pressure injury of skin   Hematochezia - GI input greatly appreciated, was felt lower GI bleed secondary to constipation, probably diverticular bleed, hemoglobin remained stable, so plan to continue monitoring for now. - Still having moderate amount blood in stool, but hemoglobin remained stable.   Anemia - Patient with known history of anemia of chronic disease, with recent acute blood loss anemia postoperatively, hemoglobin remained stable, continue to monitor closely and transfuse as needed.  Cardiomyopathy: -   Last echocardiogram 03/10/2016 EF 50- 55% with no significant wall motion abnormalities - Appears to be euvolemic, monitor closely as on gentle hydration  Atrial fibrillation - CHA2DS2-VASc Score = 3, was on Xarelto which has been stopped during last admission given multiple falls and hemoglobin trending down - Continue to hold aspirin cough and lower GI bleed, - Rate controlled, not on any medications  Hyponatremia - Secondary to volume  depletion, improving on normal saline  Chronic kidney disease stage III:  - Creatinine near patient's baseline.  - Continue to monitor  Recent admission with close tibial fracture status post surgical repair - PT consulted  Chronic respiratory failure - At baseline, on 2.5 L nasal cannula  COPD - Continue with when necessary nebs, no active wheezing  GERD - Continue with PPI  History of hypertension - Not on any home medications, actually blood pressure on the soft side  Pressure ulcer - Continue with wound care  Protein calorie malnutrition - We'll start on ensure  Code Status : Full  Family Communication  :None at bedside   Disposition Plan  : back to SNF when stable  Consults  :  GI  Procedures  : none  DVT Prophylaxis  :  SCDs   Lab Results  Component Value Date   PLT 242 07/06/2016    Antibiotics  :    Anti-infectives    None        Objective:   Vitals:   07/05/16 1418 07/05/16 2008 07/06/16 0441 07/06/16 0457  BP: (!) 109/44 (!) 129/52 (!) 123/55   Pulse: 73 74 60   Resp: 16 18 16    Temp: 98.3 F (36.8 C) 97.6 F (36.4 C) 97.9 F (36.6 C)   TempSrc: Oral Oral Oral   SpO2: 100% 100% 97%   Weight:    47 kg (103 lb 9.9 oz)  Height:        Wt Readings from Last 3 Encounters:  07/06/16 47 kg (103 lb 9.9 oz)  06/30/16 40.8 kg (90 lb)  06/26/16 42 kg (92 lb 11.2 oz)     Intake/Output Summary (Last 24 hours) at 07/06/16 1040 Last data filed at 07/06/16 0609  Gross per 24 hour  Intake           1770.83 ml  Output             1000 ml  Net           770.83 ml     Physical Exam  Awake Alert, Oriented X 3,  Supple Neck,No JVD,  Symmetrical Chest wall movement, Good air movement bilaterally, CTAB IRR IRR, No Gallops,Rubs or new Murmurs, No Parasternal Heave +ve B.Sounds, Abd Soft, No tenderness, No rebound - guarding or rigidity. No Cyanosis, Clubbing or edema,left lower extremity was significant bruising secondary to recent fall and fracture    Data Review:    CBC  Recent Labs Lab 07/04/16 1521 07/04/16 1957 07/05/16 0405 07/05/16 1331 07/06/16 0355  WBC 10.1  --  11.7*  --  8.5  HGB 8.9* 9.0* 9.3* 8.6* 8.6*  HCT 26.1* 26.3* 27.8* 25.9* 25.6*  PLT 268  --  288  --  242  MCV 93.9  --  96.9  --  93.1  MCH 32.0  --  32.4  --  31.3  MCHC 34.1  --  33.5  --  33.6  RDW 14.7  --  14.7  --  14.6  LYMPHSABS 1.0  --   --   --   --   MONOABS 0.8  --   --   --   --   EOSABS 0.1  --   --   --   --   BASOSABS 0.0  --   --   --   --     Chemistries   Recent Labs Lab 07/04/16 1521 07/05/16 0405 07/06/16 0355  NA 125* 129* 129*  K  4.4 4.4 4.0  CL 97* 98* 100*  CO2 22 25 20*  GLUCOSE 108* 108* 90  BUN 35* 30* 25*  CREATININE 0.86 0.81 0.81  CALCIUM 8.4* 8.1* 8.0*   ------------------------------------------------------------------------------------------------------------------ No results for input(s): CHOL, HDL, LDLCALC, TRIG, CHOLHDL, LDLDIRECT in the last 72 hours.  Lab Results  Component Value Date   HGBA1C 5.7 08/24/2015   ------------------------------------------------------------------------------------------------------------------ No results for input(s): TSH, T4TOTAL, T3FREE, THYROIDAB in the last 72 hours.  Invalid input(s): FREET3 ------------------------------------------------------------------------------------------------------------------ No results for input(s): VITAMINB12, FOLATE, FERRITIN, TIBC, IRON, RETICCTPCT in the last 72  hours.  Coagulation profile  Recent Labs Lab 07/04/16 1521 07/05/16 0405  INR 1.07 1.08    No results for input(s): DDIMER in the last 72 hours.  Cardiac Enzymes No results for input(s): CKMB, TROPONINI, MYOGLOBIN in the last 168 hours.  Invalid input(s): CK ------------------------------------------------------------------------------------------------------------------    Component Value Date/Time   BNP 799.9 (H) 06/20/2016 2019    Inpatient Medications  Scheduled Meds: . calcium carbonate  2 tablet Oral Daily  . cholecalciferol  1,000 Units Oral Daily  . multivitamin-lutein  1 capsule Oral Daily  . pantoprazole  40 mg Oral Daily  . polyethylene glycol  17 g Oral BID   Continuous Infusions: . sodium chloride 50 mL/hr at 07/06/16 0158   PRN Meds:.albuterol, oxyCODONE, senna  Micro Results No results found for this or any previous visit (from the past 240 hour(s)).  Radiology Reports Dg Chest 1 View  Result Date: 06/20/2016 CLINICAL DATA:  82 year old male status post fall today. Initial encounter. EXAM: CHEST 1 VIEW COMPARISON:  02/27/2016, levin 11/15/2007. FINDINGS: Right upper extremity artifact projecting over the diaphragm. Cardiac silhouette has increased on this PA view compared to August. Other mediastinal contours are within normal limits. Chronic hyperinflation. No pneumothorax, pulmonary edema, pleural effusion or confluent pulmonary opacity. EKG button artifact projecting in the right upper lung. Osteopenia. Previously augmented upper lumbar compression fracture. No acute osseous abnormality identified. IMPRESSION: 1. Cardiac silhouette appears mildly increased since August. Consider pericardial effusion. 2. No other acute cardiopulmonary abnormality. Chronic hyperinflation. 3. Right upper extremity artifact projecting over the diaphragm. Electronically Signed   By: Genevie Ann M.D.   On: 06/20/2016 21:10   Dg Knee 2 Views Left  Result Date:  06/20/2016 CLINICAL DATA:  Patient fell today. Swelling in the left knee. Lower leg feels numb. Not able to straighten knee. Severe pain. EXAM: LEFT KNEE - 1-2 VIEW COMPARISON:  None. FINDINGS: Diffuse bone demineralization. Postoperative changes with hardware fixation in the femoral metaphysis, incompletely included within the field of view. Oblique probably comminuted fractures of the left proximal tibial meta diaphysis with about 7 mm lateral displacement of the distal fracture fragment. Alignment appears intact. Slight cortical irregularity at the junction of the proximal fibular head neck is incompletely visualized but could also represent fibular fracture. Soft tissue swelling. Prominent degenerative changes in the left knee with chondrocalcinosis and small osteochondral defect in the left medial femoral condyle. No significant effusion. Vascular calcifications. IMPRESSION: Diffuse bone demineralization with acute mildly displaced fracture of the proximal left tibia at the junction of the metaphysis and shaft. Probable nondisplaced fracture of the proximal left fibula. Electronically Signed   By: Lucienne Capers M.D.   On: 06/20/2016 21:10   Dg Tibia/fibula Left  Result Date: 06/22/2016 CLINICAL DATA:  Post left tibial fracture repair EXAM: DG C-ARM 61-120 MIN; LEFT TIBIA AND FIBULA - 2 VIEW COMPARISON:  06/20/2016 FINDINGS: Seven views of the left tibia and fibula  submitted. The patient is status post intraoperative repair of proximal left tibial fracture. There is intra medullary rod and 3 metallic fixation screws are noted in proximal left tibia. Two metallic fixation screws are noted in and distal tibia. There is improvement in alignment. Again noted minimal displaced fracture in proximal left fibula with improvement in alignment. IMPRESSION: The patient is status post intraoperative repair of proximal left tibial fracture. There is intra medullary rod and 3 metallic fixation screws are noted in  proximal left tibia. Two metallic fixation screws are noted in and distal tibia. There is improvement in alignment. Again noted minimal displaced fracture in proximal left fibula with improvement in alignment. Fluoroscopy time was 2 minutes 25 seconds. Please see the operative report. Electronically Signed   By: Lahoma Crocker M.D.   On: 06/22/2016 13:32   Dg Tibia/fibula Left  Result Date: 06/20/2016 CLINICAL DATA:  [place] today with swelling in the left knee. Pain and limited range of motion. EXAM: LEFT TIBIA AND FIBULA - 2 VIEW COMPARISON:  None. FINDINGS: Diffuse bone demineralization. Acute oblique fracture of the proximal left tibia at the junction of the metaphysis and shaft. There is about 7 mm lateral displacement of the distal fracture fragment. Alignment is intact. Midshaft and distal tibia appear intact. Limited visualization of the proximal fibula. Possible fracture of the proximal fibula is not well evaluated on this study. See additional report of the left knee. Degenerative changes in the left knee and left ankle. Vascular calcifications throughout. IMPRESSION: Oblique fracture of the proximal left tibia with mild lateral displacement of the distal fracture fragment. Electronically Signed   By: Lucienne Capers M.D.   On: 06/20/2016 21:13   Dg Tibia/fibula Left Port  Result Date: 06/22/2016 CLINICAL DATA:  Status post ORIF of tibial fracture EXAM: PORTABLE LEFT TIBIA AND FIBULA - 2 VIEW COMPARISON:  06/20/2016 FINDINGS: Postsurgical changes are again seen in the distal femur stable from prior exam. A new tibial medullary rod with proximal and distal fixation screws is noted. The fracture fragments are in near anatomic alignment. IMPRESSION: Status post ORIF of tibial fracture. Electronically Signed   By: Inez Catalina M.D.   On: 06/22/2016 12:47   Dg C-arm 1-60 Min  Result Date: 06/22/2016 CLINICAL DATA:  Post left tibial fracture repair EXAM: DG C-ARM 61-120 MIN; LEFT TIBIA AND FIBULA - 2  VIEW COMPARISON:  06/20/2016 FINDINGS: Seven views of the left tibia and fibula submitted. The patient is status post intraoperative repair of proximal left tibial fracture. There is intra medullary rod and 3 metallic fixation screws are noted in proximal left tibia. Two metallic fixation screws are noted in and distal tibia. There is improvement in alignment. Again noted minimal displaced fracture in proximal left fibula with improvement in alignment. IMPRESSION: The patient is status post intraoperative repair of proximal left tibial fracture. There is intra medullary rod and 3 metallic fixation screws are noted in proximal left tibia. Two metallic fixation screws are noted in and distal tibia. There is improvement in alignment. Again noted minimal displaced fracture in proximal left fibula with improvement in alignment. Fluoroscopy time was 2 minutes 25 seconds. Please see the operative report. Electronically Signed   By: Lahoma Crocker M.D.   On: 06/22/2016 13:32      [place] Sorn M.D on 07/06/2016 at 10:40 AM  Between 7am to 7pm - Pager - [phone]  After 7pm go to [email or web address] - password [facility]  [facility] -  Office  [phone]

## 2016-07-07 ENCOUNTER — Ambulatory Visit: Payer: Medicare Other | Admitting: Physician Assistant

## 2016-07-07 DIAGNOSIS — R531 Weakness: Secondary | ICD-10-CM | POA: Diagnosis not present

## 2016-07-07 DIAGNOSIS — M6281 Muscle weakness (generalized): Secondary | ICD-10-CM | POA: Diagnosis not present

## 2016-07-07 DIAGNOSIS — N189 Chronic kidney disease, unspecified: Secondary | ICD-10-CM | POA: Diagnosis not present

## 2016-07-07 DIAGNOSIS — R0902 Hypoxemia: Secondary | ICD-10-CM | POA: Diagnosis not present

## 2016-07-07 DIAGNOSIS — R2681 Unsteadiness on feet: Secondary | ICD-10-CM | POA: Diagnosis not present

## 2016-07-07 DIAGNOSIS — R079 Chest pain, unspecified: Secondary | ICD-10-CM | POA: Diagnosis not present

## 2016-07-07 DIAGNOSIS — I482 Chronic atrial fibrillation: Secondary | ICD-10-CM | POA: Diagnosis not present

## 2016-07-07 DIAGNOSIS — Z4789 Encounter for other orthopedic aftercare: Secondary | ICD-10-CM | POA: Diagnosis not present

## 2016-07-07 DIAGNOSIS — Z87891 Personal history of nicotine dependence: Secondary | ICD-10-CM | POA: Diagnosis not present

## 2016-07-07 DIAGNOSIS — R278 Other lack of coordination: Secondary | ICD-10-CM | POA: Diagnosis not present

## 2016-07-07 DIAGNOSIS — K922 Gastrointestinal hemorrhage, unspecified: Secondary | ICD-10-CM | POA: Diagnosis not present

## 2016-07-07 DIAGNOSIS — R634 Abnormal weight loss: Secondary | ICD-10-CM | POA: Diagnosis not present

## 2016-07-07 DIAGNOSIS — M79609 Pain in unspecified limb: Secondary | ICD-10-CM | POA: Diagnosis not present

## 2016-07-07 DIAGNOSIS — D62 Acute posthemorrhagic anemia: Secondary | ICD-10-CM | POA: Diagnosis not present

## 2016-07-07 DIAGNOSIS — I2510 Atherosclerotic heart disease of native coronary artery without angina pectoris: Secondary | ICD-10-CM | POA: Diagnosis not present

## 2016-07-07 DIAGNOSIS — L89620 Pressure ulcer of left heel, unstageable: Secondary | ICD-10-CM | POA: Diagnosis not present

## 2016-07-07 DIAGNOSIS — S82112A Displaced fracture of left tibial spine, initial encounter for closed fracture: Secondary | ICD-10-CM | POA: Diagnosis not present

## 2016-07-07 DIAGNOSIS — N390 Urinary tract infection, site not specified: Secondary | ICD-10-CM | POA: Diagnosis not present

## 2016-07-07 DIAGNOSIS — I129 Hypertensive chronic kidney disease with stage 1 through stage 4 chronic kidney disease, or unspecified chronic kidney disease: Secondary | ICD-10-CM | POA: Diagnosis not present

## 2016-07-07 DIAGNOSIS — K219 Gastro-esophageal reflux disease without esophagitis: Secondary | ICD-10-CM | POA: Diagnosis not present

## 2016-07-07 DIAGNOSIS — R05 Cough: Secondary | ICD-10-CM | POA: Diagnosis not present

## 2016-07-07 DIAGNOSIS — S82192D Other fracture of upper end of left tibia, subsequent encounter for closed fracture with routine healing: Secondary | ICD-10-CM | POA: Diagnosis not present

## 2016-07-07 DIAGNOSIS — E871 Hypo-osmolality and hyponatremia: Secondary | ICD-10-CM | POA: Diagnosis not present

## 2016-07-07 DIAGNOSIS — E43 Unspecified severe protein-calorie malnutrition: Secondary | ICD-10-CM | POA: Diagnosis not present

## 2016-07-07 DIAGNOSIS — D638 Anemia in other chronic diseases classified elsewhere: Secondary | ICD-10-CM | POA: Diagnosis not present

## 2016-07-07 DIAGNOSIS — L89610 Pressure ulcer of right heel, unstageable: Secondary | ICD-10-CM | POA: Diagnosis not present

## 2016-07-07 DIAGNOSIS — I959 Hypotension, unspecified: Secondary | ICD-10-CM | POA: Diagnosis not present

## 2016-07-07 DIAGNOSIS — Z8551 Personal history of malignant neoplasm of bladder: Secondary | ICD-10-CM | POA: Diagnosis not present

## 2016-07-07 DIAGNOSIS — S82192S Other fracture of upper end of left tibia, sequela: Secondary | ICD-10-CM | POA: Diagnosis not present

## 2016-07-07 DIAGNOSIS — K5909 Other constipation: Secondary | ICD-10-CM | POA: Diagnosis not present

## 2016-07-07 DIAGNOSIS — Z9181 History of falling: Secondary | ICD-10-CM | POA: Diagnosis not present

## 2016-07-07 DIAGNOSIS — J449 Chronic obstructive pulmonary disease, unspecified: Secondary | ICD-10-CM | POA: Diagnosis not present

## 2016-07-07 DIAGNOSIS — R630 Anorexia: Secondary | ICD-10-CM | POA: Diagnosis not present

## 2016-07-07 DIAGNOSIS — R0602 Shortness of breath: Secondary | ICD-10-CM | POA: Diagnosis present

## 2016-07-07 DIAGNOSIS — R5381 Other malaise: Secondary | ICD-10-CM | POA: Diagnosis not present

## 2016-07-07 DIAGNOSIS — S82202D Unspecified fracture of shaft of left tibia, subsequent encounter for closed fracture with routine healing: Secondary | ICD-10-CM | POA: Diagnosis not present

## 2016-07-07 LAB — BASIC METABOLIC PANEL
Anion gap: 4 — ABNORMAL LOW (ref 5–15)
BUN: 20 mg/dL (ref 6–20)
CALCIUM: 8 mg/dL — AB (ref 8.9–10.3)
CO2: 25 mmol/L (ref 22–32)
CREATININE: 0.87 mg/dL (ref 0.61–1.24)
Chloride: 102 mmol/L (ref 101–111)
GFR calc non Af Amer: >60 mL/min (ref >60)
Glucose, Bld: 104 mg/dL — ABNORMAL HIGH (ref 65–99)
Potassium: 4 mmol/L (ref 3.5–5.1)
Sodium: 131 mmol/L — ABNORMAL LOW (ref 135–145)

## 2016-07-07 LAB — CBC
HEMATOCRIT: 25.4 % — AB (ref 39.0–52.0)
Hemoglobin: 8.6 g/dL — ABNORMAL LOW (ref 13.0–17.0)
MCH: 32.6 pg (ref 26.0–34.0)
MCHC: 33.9 g/dL (ref 30.0–36.0)
MCV: 96.2 fL (ref 78.0–100.0)
Platelets: 253 10*3/uL (ref 150–400)
RBC: 2.64 MIL/uL — ABNORMAL LOW (ref 4.22–5.81)
RDW: 14.7 % (ref 11.5–15.5)
WBC: 7.8 10*3/uL (ref 4.0–10.5)

## 2016-07-07 MED ORDER — SENNA 8.6 MG PO TABS: 1.0000 | ORAL_TABLET | Freq: Every day | ORAL | 0 refills | Status: DC

## 2016-07-07 MED ORDER — ENSURE ENLIVE PO LIQD: 237.0000 mL | Freq: Three times a day (TID) | ORAL | 12 refills | Status: DC

## 2016-07-07 MED ORDER — OXYCODONE HCL 5 MG PO TABS: 5.0000 mg | ORAL_TABLET | Freq: Four times a day (QID) | ORAL | 0 refills | Status: AC | PRN

## 2016-07-07 MED ORDER — ALUM & MAG HYDROXIDE-SIMETH 200-200-20 MG/5ML PO SUSP
30.0000 mL | Freq: Four times a day (QID) | ORAL | Status: DC | PRN
  Administered 2016-07-07: 30 mL via ORAL
  Filled 2016-07-07: qty 30

## 2016-07-07 MED ORDER — ALUM & MAG HYDROXIDE-SIMETH 200-200-20 MG/5ML PO SUSP: 30.0000 mL | Freq: Four times a day (QID) | ORAL | 0 refills | Status: DC | PRN

## 2016-07-07 MED ORDER — POLYETHYLENE GLYCOL 3350 17 G PO PACK
17.0000 g | PACK | Freq: Every day | ORAL | 0 refills | Status: AC
Start: 2016-07-07 — End: ?

## 2016-07-07 NOTE — Care Management Note (Signed)
Case Management Note  Patient Details  Name: Sean [name] MRN: [id number] Date of Birth: 07-Aug-1924  Subjective/Objective:      82 yo admitted with Lower GI bleed.              Action/Plan: From [facility]. To SNF at discharge.  Expected Discharge Date:   (Unknown)               Expected Discharge Plan:  Skilled Nursing Facility  In-House Referral:  Clinical Social Work  Discharge planning Services  CM Consult  Post Acute Care Choice:    Choice offered to:     DME Arranged:    DME Agency:     HH Arranged:    [place] Agency:     Status of Service:  In process, will continue to follow  If discussed at Long Length of Stay Meetings, dates discussed:    Additional CommentsLynnell Catalan, RN 07/07/2016, 10:51 AM  [phone]

## 2016-07-07 NOTE — Discharge Summary (Addendum)
Sean [name], is a 82 y.o. male  DOB [birth year]  MRN [id number].  Admission date:  07/04/2016  Admitting Physician  Albertine Patricia, MD  Discharge Date:  07/07/2016   Primary MD  Cathlean Cower, MD  Recommendations for primary care physician for things to follow:  - please check CBC in 72 hours, and resume back on aspirin 81 mg oral daily if hemoglobin is stable and no recurrence of bright red blood per rectum. - baseline oxygen 2.5 L nasal cannula   Admission Diagnosis  Lower GI bleed [K92.2]   Discharge Diagnosis  Lower GI bleed [K92.2]    Active Problems:   COPD (chronic obstructive pulmonary disease) (HCC)   GERD   Constipation   Back pain   Atrial fibrillation (HCC)   Closed tibia fracture   Anemia of chronic disease   Lower GI bleed   Pressure injury of skin      Past Medical History:  Diagnosis Date  . ABUSE, ALCOHOL, IN REMISSION 06/17/2007  . Adenomatous colon polyp 01/1984  . Alcoholic cirrhosis of liver ([place]) 06/17/2007  . Aortic regurgitation 03/11/2016  . BLADDER CANCER 06/17/2007  . Cardiomyopathy ([place]) 03/11/2016  . CAROTID ARTERY DISEASE 07/23/2009  . COMPRESSION FRACTURE, LUMBAR VERTEBRAE 08/09/2009  . COPD 09/18/2008  . DIVERTICULOSIS, COLON 06/17/2007  . ECHOCARDIOGRAM, ABNORMAL 07/06/2008  . GERD 06/17/2007  . GLUCOSE INTOLERANCE 06/17/2007  . Hiatal hernia   . HYPERLIPIDEMIA 06/17/2007  . HYPERTENSION 06/17/2007  . Impaired glucose tolerance 12/13/2010  . Internal hemorrhoids   . LOW BACK PAIN, CHRONIC 12/17/2009  . OSTEOARTHRITIS, KNEES, BILATERAL 06/19/2009  . OSTEOPOROSIS 06/17/2007  . PEPTIC ULCER DISEASE 06/17/2007  . PSA, INCREASED 09/18/2008  . RENAL INSUFFICIENCY 09/18/2008  . Tubulovillous adenoma 04/2000   Duodenal polyp  . UNSPECIFIED SUDDEN HEARING LOSS 03/20/2009  . VITAMIN D DEFICIENCY 09/18/2008    Past Surgical History:  Procedure Laterality  Date  . CHOLECYSTECTOMY  04/2000  . inguinal herniorrhapy    . LUMBAR LAMINECTOMY     x 2  . s/p Bilroth II    . s/p bowel obstruction surgury  12/01  . s/p left middle ear surgury  late 29's   Dr. Juanell Fairly  . TIBIA IM NAIL INSERTION Left 06/22/2016   Procedure: INTRAMEDULLARY (IM) NAIL TIBIAL;  Surgeon: Leandrew Koyanagi, MD;  Location: [place];  Service: Orthopedics;  Laterality: Left;       History of present illness and  Hospital Course:     Kindly see H&P for history of present illness and admission details, please review complete Labs, Consult reports and Test reports for all details in brief  HPI  from the history and physical done on the day of admission 07/04/2016   Sean [name]  is a 82 y.o. male, 82 y.o.malewith medical history significant of HTN, HLD, A.Fib( xarelto stopped recently) PVD, COPD, GERD, Recently discharged to SNF from [facility] status post surgical repair of left tibial fracture. Patient was sent from [name] with GI bleeding, patient  was noticed to have bright red blood in brief this, so he was brought to ED, patient is currently on full dose aspirin, Xarelto has been stopped on recent discharge giving hemoglobin trending down, and recurrent falls, patient reports generalized weakness, lower back pain, left leg pain, as well wife reports constipation and straining episode yesterday ,reports generalized weakness and fatigue, dysuria and polyuria, but he denies fever, chills, nausea vomiting or abdominal pain, diarrhea. Patient's most recent endoscopy in 2013, significant for Bilroth type II, colonoscopy in 2008 significant for internal hemorrhoids, polyps and diverticulosis. In ED , rectal exam was significant for bright blood, and sodium of 125, hemoglobin of 8.9, baseline is 8.8, hospitalist requested to admit for further workup.   Hospital Course   82 y.o.male,82 y.o.malewith medical history significant of HTN, HLD, A.Fib( xarelto stopped  recently)PVD, COPD, GERD, Recently discharged to SNF from [facility] status post surgical repair of left tibial fracture. Patient admitted admitted secondary to hematochezia, recent colonoscopy 2008 significant for polyp/diverticulosis and internal hemorrhoid, seen by GI, neuro recommendation for workup, hemoglobin remained stable, over the first 48 hours he had some blood in stool, but over last 48 hours no evidence of any blood in stool, his hemoglobin remained stable during entire stay, with no need of transfusion.  Hematochezia - GI input greatly appreciated, was felt lower GI bleed secondary to constipation, probably diverticular bleed, as well possibly hemorrhoids, hemoglobin remained stable during hospital stay, no recurrence of blood in stool over last 24 hours, we'll discharge on laxative regimen to avoid constipation, will hold full dose aspirin, check CBC in 3 days, if hemoglobin is stable can be started on aspirin 81 mg oral daily.     Anemia - Patient with known history of anemia of chronic disease, with recent acute blood loss anemia postoperatively, hemoglobin remained stable during hospital stay, is 8.6 on discharge  Cardiomyopathy: - Last echocardiogram 03/10/2016 EF 50- 55% with no significant wall motion abnormalities - Appears to be euvolemic.  Atrial fibrillation - CHA2DS2-VASc Score = 3, was on Xarelto which has been stopped during last admission given multiple falls and hemoglobin trending down - Continue to hold aspirin cough and lower GI bleed, and be resumed on aspirin 81 mg oral daily in 3 days if no evidence of GI bleed and hemoglobin remained stable. - Rate controlled, not on any medications  Hyponatremia - Secondary to volume depletion, improving on normal saline, sodium 131.  Chronic kidney disease stage III:  - Creatinine near patient's baseline.   Recent admission with close tibial fracture status post surgical repair - PT  consulted  Chronic respiratory failure - At baseline, on 2.5 L nasal cannula  COPD - Continue with when necessary nebs, no active wheezing  GERD - Continue with PPI  History of hypertension - Not on any home medications, actually blood pressure on the soft side  Pressure ulcer - Continue with wound care  Protein calorie malnutrition -  started on ensure   Discharge Condition:  stable   Follow UP  Follow-up Information    Cathlean Cower, MD Follow up in 1 week(s).   Specialties:  Internal Medicine, Radiology Contact information: [employer] [place] [address] [phone]             Discharge Instructions  and  Discharge Medications     Discharge Instructions    Discharge instructions    Complete by:  As directed    Follow with Primary MD Cathlean Cower,  MD after discharge from SNF.  Get CBC, CMP,  ray checked  by Primary MD next visit.    Activity: As tolerated with Full fall precautions use walker/cane & assistance as needed   Disposition Home    Diet: Heart Healthy  , with feeding assistance and aspiration precautions.  For Heart failure patients - Check your Weight same time everyday, if you gain over 2 pounds, or you develop in leg swelling, experience more shortness of breath or chest pain, call your Primary MD immediately. Follow Cardiac Low Salt Diet and 1.5 lit/day fluid restriction.   On your next visit with your primary care physician please Get Medicines reviewed and adjusted.   Please request your Prim.MD to go over all Hospital Tests and Procedure/Radiological results at the follow up, please get all Hospital records sent to your Prim MD by signing hospital release before you go home.   If you experience worsening of your admission symptoms, develop shortness of breath, life threatening emergency, suicidal or homicidal thoughts you must seek medical attention immediately by calling 911 or calling your MD immediately  if  symptoms less severe.  You Must read complete instructions/literature along with all the possible adverse reactions/side effects for all the Medicines you take and that have been prescribed to you. Take any new Medicines after you have completely understood and accpet all the possible adverse reactions/side effects.   Do not drive, operating heavy machinery, perform activities at heights, swimming or participation in water activities or provide baby sitting services if your were admitted for syncope or siezures until you have seen by Primary MD or a Neurologist and advised to do so again.  Do not drive when taking Pain medications.    Do not take more than prescribed Pain, Sleep and Anxiety Medications  Special Instructions: If you have smoked or chewed Tobacco  in the last 2 yrs please stop smoking, stop any regular Alcohol  and or any Recreational drug use.  Wear Seat belts while driving.   Please note  You were cared for by a hospitalist during your hospital stay. If you have any questions about your discharge medications or the care you received while you were in the hospital after you are discharged, you can call the unit and asked to speak with the hospitalist on call if the hospitalist that took care of you is not available. Once you are discharged, your primary care physician will handle any further medical issues. Please note that NO REFILLS for any discharge medications will be authorized once you are discharged, as it is imperative that you return to your primary care physician (or establish a relationship with a primary care physician if you do not have one) for your aftercare needs so that they can reassess your need for medications and monitor your lab values.   Increase activity slowly    Complete by:  As directed        Medication List    STOP taking these medications   aspirin 325 MG tablet   UNABLE TO FIND     TAKE these medications   alum & mag hydroxide-simeth  200-200-20 MG/5ML suspension Commonly known as:  MAALOX/MYLANTA Take 30 mLs by mouth every 6 (six) hours as needed for indigestion or heartburn.   calcium carbonate 500 MG chewable tablet Commonly known as:  TUMS - dosed in mg elemental calcium Chew 2 tablets by mouth daily.   cholecalciferol 1000 units tablet Commonly known as:  VITAMIN D Take 1,000 Units  by mouth daily.   DECUBI-VITE Caps Take 1 capsule by mouth daily.   feeding supplement (ENSURE ENLIVE) Liqd Take 237 mLs by mouth 3 (three) times daily between meals.   omeprazole 20 MG capsule Commonly known as:  PRILOSEC Take 1 capsule (20 mg total) by mouth 2 (two) times daily before a meal.   oxyCODONE 5 MG immediate release tablet Commonly known as:  ROXICODONE Take 1 tablet (5 mg total) by mouth every 6 (six) hours as needed.   polyethylene glycol packet Commonly known as:  MIRALAX / GLYCOLAX Take 17 g by mouth daily. What changed:  when to take this  reasons to take this   senna 8.6 MG Tabs tablet Commonly known as:  SENOKOT Take 1 tablet (8.6 mg total) by mouth daily. What changed:  when to take this  reasons to take this         Diet and Activity recommendation: See Discharge Instructions above   Consults obtained -  GI   Major procedures and Radiology Reports - PLEASE review detailed and final reports for all details, in brief -     Dg Chest 1 View  Result Date: 06/20/2016 CLINICAL DATA:  82 year old male status post fall today. Initial encounter. EXAM: CHEST 1 VIEW COMPARISON:  02/27/2016, levin 11/15/2007. FINDINGS: Right upper extremity artifact projecting over the diaphragm. Cardiac silhouette has increased on this PA view compared to August. Other mediastinal contours are within normal limits. Chronic hyperinflation. No pneumothorax, pulmonary edema, pleural effusion or confluent pulmonary opacity. EKG button artifact projecting in the right upper lung. Osteopenia. Previously augmented  upper lumbar compression fracture. No acute osseous abnormality identified. IMPRESSION: 1. Cardiac silhouette appears mildly increased since August. Consider pericardial effusion. 2. No other acute cardiopulmonary abnormality. Chronic hyperinflation. 3. Right upper extremity artifact projecting over the diaphragm. Electronically Signed   By: Genevie Ann M.D.   On: 06/20/2016 21:10   Dg Knee 2 Views Left  Result Date: 06/20/2016 CLINICAL DATA:  Patient fell today. Swelling in the left knee. Lower leg feels numb. Not able to straighten knee. Severe pain. EXAM: LEFT KNEE - 1-2 VIEW COMPARISON:  None. FINDINGS: Diffuse bone demineralization. Postoperative changes with hardware fixation in the femoral metaphysis, incompletely included within the field of view. Oblique probably comminuted fractures of the left proximal tibial meta diaphysis with about 7 mm lateral displacement of the distal fracture fragment. Alignment appears intact. Slight cortical irregularity at the junction of the proximal fibular head neck is incompletely visualized but could also represent fibular fracture. Soft tissue swelling. Prominent degenerative changes in the left knee with chondrocalcinosis and small osteochondral defect in the left medial femoral condyle. No significant effusion. Vascular calcifications. IMPRESSION: Diffuse bone demineralization with acute mildly displaced fracture of the proximal left tibia at the junction of the metaphysis and shaft. Probable nondisplaced fracture of the proximal left fibula. Electronically Signed   By: Lucienne Capers M.D.   On: 06/20/2016 21:10   Dg Tibia/fibula Left  Result Date: 06/22/2016 CLINICAL DATA:  Post left tibial fracture repair EXAM: DG C-ARM 61-120 MIN; LEFT TIBIA AND FIBULA - 2 VIEW COMPARISON:  06/20/2016 FINDINGS: Seven views of the left tibia and fibula submitted. The patient is status post intraoperative repair of proximal left tibial fracture. There is intra medullary rod and  3 metallic fixation screws are noted in proximal left tibia. Two metallic fixation screws are noted in and distal tibia. There is improvement in alignment. Again noted minimal displaced fracture in proximal left fibula  with improvement in alignment. IMPRESSION: The patient is status post intraoperative repair of proximal left tibial fracture. There is intra medullary rod and 3 metallic fixation screws are noted in proximal left tibia. Two metallic fixation screws are noted in and distal tibia. There is improvement in alignment. Again noted minimal displaced fracture in proximal left fibula with improvement in alignment. Fluoroscopy time was 2 minutes 25 seconds. Please see the operative report. Electronically Signed   By: Lahoma Crocker M.D.   On: 06/22/2016 13:32   Dg Tibia/fibula Left  Result Date: 06/20/2016 CLINICAL DATA:  [place] today with swelling in the left knee. Pain and limited range of motion. EXAM: LEFT TIBIA AND FIBULA - 2 VIEW COMPARISON:  None. FINDINGS: Diffuse bone demineralization. Acute oblique fracture of the proximal left tibia at the junction of the metaphysis and shaft. There is about 7 mm lateral displacement of the distal fracture fragment. Alignment is intact. Midshaft and distal tibia appear intact. Limited visualization of the proximal fibula. Possible fracture of the proximal fibula is not well evaluated on this study. See additional report of the left knee. Degenerative changes in the left knee and left ankle. Vascular calcifications throughout. IMPRESSION: Oblique fracture of the proximal left tibia with mild lateral displacement of the distal fracture fragment. Electronically Signed   By: Lucienne Capers M.D.   On: 06/20/2016 21:13   Dg Tibia/fibula Left Port  Result Date: 06/22/2016 CLINICAL DATA:  Status post ORIF of tibial fracture EXAM: PORTABLE LEFT TIBIA AND FIBULA - 2 VIEW COMPARISON:  06/20/2016 FINDINGS: Postsurgical changes are again seen in the distal femur stable from  prior exam. A new tibial medullary rod with proximal and distal fixation screws is noted. The fracture fragments are in near anatomic alignment. IMPRESSION: Status post ORIF of tibial fracture. Electronically Signed   By: Inez Catalina M.D.   On: 06/22/2016 12:47   Dg C-arm 1-60 Min  Result Date: 06/22/2016 CLINICAL DATA:  Post left tibial fracture repair EXAM: DG C-ARM 61-120 MIN; LEFT TIBIA AND FIBULA - 2 VIEW COMPARISON:  06/20/2016 FINDINGS: Seven views of the left tibia and fibula submitted. The patient is status post intraoperative repair of proximal left tibial fracture. There is intra medullary rod and 3 metallic fixation screws are noted in proximal left tibia. Two metallic fixation screws are noted in and distal tibia. There is improvement in alignment. Again noted minimal displaced fracture in proximal left fibula with improvement in alignment. IMPRESSION: The patient is status post intraoperative repair of proximal left tibial fracture. There is intra medullary rod and 3 metallic fixation screws are noted in proximal left tibia. Two metallic fixation screws are noted in and distal tibia. There is improvement in alignment. Again noted minimal displaced fracture in proximal left fibula with improvement in alignment. Fluoroscopy time was 2 minutes 25 seconds. Please see the operative report. Electronically Signed   By: Lahoma Crocker M.D.   On: 06/22/2016 13:32    Micro Results     No results found for this or any previous visit (from the past 240 hour(s)).     Today   Subjective:   Bodee Por today has no headache,no chest or abdominal pain, blood in stool or last 24 hours, normal BM this a.m.  Objective:   Blood pressure (!) 123/48, pulse 74, temperature 98.1 F (36.7 C), temperature source Oral, resp. rate 17, height 5\' 10"  (1.778 m), weight 47 kg (103 lb 9.9 oz), SpO2 98 %.   Intake/Output Summary (Last 24  hours) at 07/07/16 1233 Last data filed at 07/07/16 0600  Gross per  24 hour  Intake              360 ml  Output             1275 ml  Net             -915 ml    Exam Awake Alert, Oriented X 3,  Supple Neck,No JVD,  Symmetrical Chest wall movement, Good air movement bilaterally, CTAB IRR IRR, No Gallops,Rubs or new Murmurs, No Parasternal Heave +ve B.Sounds, Abd Soft, No tenderness, No rebound - guarding or rigidity. No Cyanosis, Clubbing or edema,left lower extremity was significant bruising secondary to recent fall and fracture   Data Review   CBC w Diff:  Lab Results  Component Value Date   WBC 7.8 07/07/2016   HGB 8.6 (L) 07/07/2016   HCT 25.4 (L) 07/07/2016   PLT 253 07/07/2016   LYMPHOPCT 10 07/04/2016   MONOPCT 8 07/04/2016   EOSPCT 1 07/04/2016   BASOPCT 0 07/04/2016    CMP:  Lab Results  Component Value Date   NA 131 (L) 07/07/2016   K 4.0 07/07/2016   CL 102 07/07/2016   CO2 25 07/07/2016   BUN 20 07/07/2016   CREATININE 0.87 07/07/2016   PROT 5.5 (L) 06/21/2016   ALBUMIN 2.8 (L) 06/21/2016   BILITOT 0.6 06/21/2016   ALKPHOS 119 06/21/2016   AST 36 06/21/2016   ALT 18 06/21/2016  .   Total Time in preparing paper work, data evaluation and todays exam - 35 minutes  Kattleya Kuhnert M.D on 07/07/2016 at 12:33 PM  [facility]   Office  [phone]

## 2016-07-07 NOTE — Progress Notes (Signed)
     [facility] Progress Note  Chief Complaint:   Rectal bleeding   Subjective: Had a slightly loose but otherwise normal BM this am per RN. Patient says he feels fine right now.   Objective:  Vital signs in last 24 hours: Temp:  [97.2 F (36.2 C)-98.2 F (36.8 C)] 98.1 F (36.7 C) (12/11 0636) Pulse Rate:  [74-94] 74 (12/11 0636) Resp:  [16-17] 17 (12/11 0636) BP: (116-123)/(48-57) 123/48 (12/11 0636) SpO2:  [97 %-100 %] 98 % (12/11 0636) Last BM Date: 07/06/16 General:   Alert, thin white male in NAD EENT:  HOH, non icteric sclera, conjunctive pink.  Heart:  Regular rate, irregular rhythm, no lower extremity edema Abdomen:  Soft, nondistended, nontender.  Normal bowel sounds, no masses felt. No hepatomegaly.    Neurologic:  Alert and  oriented x4;  grossly normal neurologically. Psych:  Alert and cooperative. Normal mood and affect.   Intake/Output from previous day: 12/10 0701 - 12/11 0700 In: 600 [P.O.:600] Out: 1275 [Urine:1275] Intake/Output this shift: No intake/output data recorded.  Lab Results:  Recent Labs  07/05/16 0405  07/06/16 0355 07/06/16 1345 07/07/16 0429  WBC 11.7*  --  8.5  --  7.8  HGB 9.3*  < > 8.6* 9.1* 8.6*  HCT 27.8*  < > 25.6* 26.8* 25.4*  PLT 288  --  242  --  253  < > = values in this interval not displayed. BMET  Recent Labs  07/05/16 0405 07/06/16 0355 07/07/16 0429  NA 129* 129* 131*  K 4.4 4.0 4.0  CL 98* 100* 102  CO2 25 20* 25  GLUCOSE 108* 90 104*  BUN 30* 25* 20  CREATININE 0.81 0.81 0.87  CALCIUM 8.1* 8.0* 8.0*   PT/INR  Recent Labs  07/04/16 1521 07/05/16 0405  LABPROT 13.9 14.0  INR 1.07 1.08    Assessment / Plan:  1. 82 yo male admitted with lower GI bleed / constipation. Resolved. Bleeding felt to be diverticular vrs hemorrhoidal in setting of constipation. Given that hgb has remained stable would favor anorectal etiology of bleeding. No blood in BM this am. GI will sign off, call if  questions.   2. Microcytic anemia. Stable. Hgb 10.6 on 11/24 prior to tibia fracture surgery. Looks like he left hospital with hgb of 8.8. This admission his presenting hgb was 9.0 and has remained stable despite GI bleeding  3. Constipation. He has as needed miralax and senokot on home med list.     LOS: 2 days   Tye Savoy  07/07/2016, 9:46 AM  Pager number [phone]

## 2016-07-07 NOTE — Progress Notes (Signed)
Pt / spouse are in agreement with plan to return to [facility] today. [facility] transport required. Medical necessity form completed. Pt / spouse are aware out of pocket costs may be associated with [facility] transport.D/C Summary sent to SNF for review. Scripts included in d/c packet. # for report provided to nsg.  Werner Lean LCSW [phone]

## 2016-07-07 NOTE — Discharge Instructions (Signed)
Follow with Primary MD Cathlean Cower, MD after discharge from SNF.  Get CBC, CMP,  ray checked  by Primary MD next visit.    Activity: As tolerated with Full fall precautions use walker/cane & assistance as needed   Disposition Home    Diet: Heart Healthy  , with feeding assistance and aspiration precautions.  For Heart failure patients - Check your Weight same time everyday, if you gain over 2 pounds, or you develop in leg swelling, experience more shortness of breath or chest pain, call your Primary MD immediately. Follow Cardiac Low Salt Diet and 1.5 lit/day fluid restriction.   On your next visit with your primary care physician please Get Medicines reviewed and adjusted.   Please request your Prim.MD to go over all Hospital Tests and Procedure/Radiological results at the follow up, please get all Hospital records sent to your Prim MD by signing hospital release before you go home.   If you experience worsening of your admission symptoms, develop shortness of breath, life threatening emergency, suicidal or homicidal thoughts you must seek medical attention immediately by calling 911 or calling your MD immediately  if symptoms less severe.  You Must read complete instructions/literature along with all the possible adverse reactions/side effects for all the Medicines you take and that have been prescribed to you. Take any new Medicines after you have completely understood and accpet all the possible adverse reactions/side effects.   Do not drive, operating heavy machinery, perform activities at heights, swimming or participation in water activities or provide baby sitting services if your were admitted for syncope or siezures until you have seen by Primary MD or a Neurologist and advised to do so again.  Do not drive when taking Pain medications.    Do not take more than prescribed Pain, Sleep and Anxiety Medications  Special Instructions: If you have smoked or chewed Tobacco  in the  last 2 yrs please stop smoking, stop any regular Alcohol  and or any Recreational drug use.  Wear Seat belts while driving.   Please note  You were cared for by a hospitalist during your hospital stay. If you have any questions about your discharge medications or the care you received while you were in the hospital after you are discharged, you can call the unit and asked to speak with the hospitalist on call if the hospitalist that took care of you is not available. Once you are discharged, your primary care physician will handle any further medical issues. Please note that NO REFILLS for any discharge medications will be authorized once you are discharged, as it is imperative that you return to your primary care physician (or establish a relationship with a primary care physician if you do not have one) for your aftercare needs so that they can reassess your need for medications and monitor your lab values.

## 2016-07-07 NOTE — NC FL2 (Signed)
[place] LEVEL OF CARE SCREENING TOOL     IDENTIFICATION  Patient Name: Sean [name] Birthdate: 1924-10-15 Sex: male Admission Date (Current Location): 07/04/2016  [facility] and [place] Number:  Herbalist and Address:  [facility],  [place] [address], [place]      Provider Number: [id number]  Attending Physician Name and Address:  Albertine Patricia, MD  Relative Name and Phone Number:       Current Level of Care: Hospital Recommended Level of Care: [place] Prior Approval Number:    Date Approved/Denied:   PASRR Number: [id number]  Discharge Plan: SNF    Current Diagnoses: Patient Active Problem List   Diagnosis Date Noted  . Pressure injury of skin 07/05/2016  . Lower GI bleed 07/04/2016  . History of open reduction and internal fixation (ORIF) procedure   . Anemia of chronic disease 06/21/2016  . Chronic anticoagulation 06/21/2016  . Closed tibia fracture 06/20/2016  . Closed tibial fracture 06/20/2016  . Cardiomyopathy ([place]) 03/11/2016  . Aortic regurgitation 03/11/2016  . Atrial fibrillation ([place]) 02/27/2016  . Chest pain 08/09/2014  . Back pain 02/01/2013  . Grief reaction 02/01/2013  . Chronic pain 01/30/2012  . Bladder neck obstruction 12/25/2011  . Weight loss 10/10/2011  . Ecchymosis 06/18/2011  . Peripheral neuropathy ([place]) 12/16/2010  . Impaired glucose tolerance 12/13/2010  . Preventative health care 12/13/2010  . LOW BACK PAIN, CHRONIC 12/17/2009  . Constipation 08/09/2009  . COMPRESSION FRACTURE, LUMBAR VERTEBRAE 08/09/2009  . CAROTID ARTERY DISEASE 07/23/2009  . OSTEOARTHRITIS, KNEES, BILATERAL 06/19/2009  . UNSPECIFIED SUDDEN HEARING LOSS 03/20/2009  . VITAMIN D DEFICIENCY 09/18/2008  . COPD (chronic obstructive pulmonary disease) ([place]) 09/18/2008  . CKD (chronic kidney disease) 09/18/2008  . PSA, INCREASED 09/18/2008  . ECHOCARDIOGRAM, ABNORMAL 07/06/2008  . BRADYCARDIA  12/17/2007  . Fatigue 06/18/2007  . BLADDER CANCER 06/17/2007  . Hyperlipemia 06/17/2007  . ABUSE, ALCOHOL, IN REMISSION 06/17/2007  . Essential hypertension 06/17/2007  . GERD 06/17/2007  . PEPTIC ULCER DISEASE 06/17/2007  . DIVERTICULOSIS, COLON 06/17/2007  . Alcoholic cirrhosis of liver ([place]) 06/17/2007  . OSTEOPOROSIS 06/17/2007  . COLONIC POLYPS, HX OF 06/17/2007    Orientation RESPIRATION BLADDER Height & Weight     Self  O2 Continent Weight: 103 lb 9.9 oz (47 kg) Height:  5\' 10"  (177.8 cm)  BEHAVIORAL SYMPTOMS/MOOD NEUROLOGICAL BOWEL NUTRITION STATUS  Other (Comment) (no behaviors)   Incontinent Diet  AMBULATORY STATUS COMMUNICATION OF NEEDS Skin   Extensive Assist Verbally Other (Comment) (stage 2 on sacrum)                       Personal Care Assistance Level of Assistance    Bathing Assistance: Maximum assistance   Dressing Assistance: Maximum assistance     Functional Limitations Info  Sight, Hearing, Speech Sight Info: Adequate Hearing Info: Adequate Speech Info: Adequate    SPECIAL CARE FACTORS FREQUENCY  PT (By licensed PT), OT (By licensed OT)     PT Frequency: 5x wk OT Frequency: 5x wk            Contractures Contractures Info: Not present    Additional Factors Info  Code Status Code Status Info: Full Code             Current Medications (07/07/2016):  This is the current hospital active medication list Current Facility-Administered Medications  Medication Dose Route Frequency Provider Last Rate Last Dose  . 0.9 %  sodium chloride infusion   Intravenous Continuous Albertine Patricia, MD 50 mL/hr at 07/07/16 0825    . albuterol (PROVENTIL) (2.5 MG/3ML) 0.083% nebulizer solution 2.5 mg  2.5 mg Nebulization Q2H PRN Albertine Patricia, MD      . calcium carbonate (TUMS - dosed in mg elemental calcium) chewable tablet 400 mg of elemental calcium  2 tablet Oral Daily Silver Huguenin Elgergawy, MD   400 mg of elemental calcium at 07/07/16 1000   . cholecalciferol (VITAMIN D) tablet 1,000 Units  1,000 Units Oral Daily Albertine Patricia, MD   1,000 Units at 07/07/16 1000  . feeding supplement (ENSURE ENLIVE) (ENSURE ENLIVE) liquid 237 mL  237 mL Oral TID BM Albertine Patricia, MD   237 mL at 07/07/16 1000  . multivitamin-lutein (OCUVITE-LUTEIN) capsule 1 capsule  1 capsule Oral Daily Albertine Patricia, MD   1 capsule at 07/07/16 0959  . oxyCODONE (Oxy IR/ROXICODONE) immediate release tablet 5 mg  5 mg Oral Q6H PRN Albertine Patricia, MD   5 mg at 07/06/16 2209  . pantoprazole (PROTONIX) EC tablet 40 mg  40 mg Oral Daily Albertine Patricia, MD   40 mg at 07/07/16 1000  . polyethylene glycol (MIRALAX / GLYCOLAX) packet 17 g  17 g Oral BID Albertine Patricia, MD   17 g at 07/06/16 1046  . senna (SENOKOT) tablet 8.6 mg  1 tablet Oral QHS PRN Albertine Patricia, MD         Discharge Medications: Please see discharge summary for a list of discharge medications.  Relevant Imaging Results:  Relevant Lab Results:   Additional Information SS#: [id number]  Dailah Opperman, Randall An, LCSW

## 2016-07-08 ENCOUNTER — Telehealth: Payer: Self-pay | Admitting: *Deleted

## 2016-07-08 NOTE — Telephone Encounter (Signed)
Pt was on TCM list was admitted for GI bleed. Pt was D/C 12/11, and sent to SNF. In notes he will need to make f/u appt after being discharge from SNF...Sean [name]

## 2016-07-09 ENCOUNTER — Non-Acute Institutional Stay (SKILLED_NURSING_FACILITY): Payer: Medicare Other | Admitting: Internal Medicine

## 2016-07-09 ENCOUNTER — Encounter: Payer: Self-pay | Admitting: Internal Medicine

## 2016-07-09 DIAGNOSIS — K219 Gastro-esophageal reflux disease without esophagitis: Secondary | ICD-10-CM | POA: Diagnosis not present

## 2016-07-09 DIAGNOSIS — E871 Hypo-osmolality and hyponatremia: Secondary | ICD-10-CM

## 2016-07-09 DIAGNOSIS — S82192S Other fracture of upper end of left tibia, sequela: Secondary | ICD-10-CM | POA: Diagnosis not present

## 2016-07-09 DIAGNOSIS — D62 Acute posthemorrhagic anemia: Secondary | ICD-10-CM

## 2016-07-09 DIAGNOSIS — R2681 Unsteadiness on feet: Secondary | ICD-10-CM | POA: Diagnosis not present

## 2016-07-09 DIAGNOSIS — R5381 Other malaise: Secondary | ICD-10-CM | POA: Diagnosis not present

## 2016-07-09 DIAGNOSIS — E43 Unspecified severe protein-calorie malnutrition: Secondary | ICD-10-CM

## 2016-07-09 DIAGNOSIS — I482 Chronic atrial fibrillation, unspecified: Secondary | ICD-10-CM

## 2016-07-09 DIAGNOSIS — M79609 Pain in unspecified limb: Secondary | ICD-10-CM | POA: Diagnosis not present

## 2016-07-09 DIAGNOSIS — K5909 Other constipation: Secondary | ICD-10-CM

## 2016-07-09 NOTE — Progress Notes (Signed)
LOCATION: [facility]  PCP: Cathlean Cower, MD   Code Status: Full Code  Goals of care: Advanced Directive information Advanced Directives 07/04/2016  Does Patient Have a Medical Advance Directive? No  Would patient like information on creating a medical advance directive? No - Patient declined       Extended Emergency Contact Information Primary Emergency Contact: Poole,Charles Harrie Jeans)  [place] [employer] Phone: [phone] Relation: Son Secondary Emergency Contact: Thornell,Frances M Address: [address], [place] Phone: [phone] Relation: Spouse   Allergies  Allergen Reactions  . Amoxicillin-Pot Clavulanate     REACTION: rash  . Ibuprofen     REACTION: itching    Chief Complaint  Patient presents with  . Readmit To SNF    Readmission Visit     HPI:  Patient is a 82 y.o. male seen today for short term rehabilitation post hospital re-admission from 07/04/16-07/07/16 with hematochezia. He was seen by GI team and no further workup was recommended. He had anemia but did not require blood transfusion. His bleed was thought to be due to diverticular disease. He was here for STR post hospital admission from 06/20/16-06/25/16 with left closed tibial fracture post fall s/p surgical fixation on 06/22/16. He has history of HTN, COPD, FTT, CKD 3, afib among others. He is seen in his room today with his wife and son at bedside.     Review of Systems:  Constitutional: Negative for fever, chills, diaphoresis. he feels weak and tired.  HENT: Negative for congestion, sore throat, difficulty swallowing. Positive for occasional headaches and nasal discharge.  Eyes: Negative for blurred vision, double vision and discharge.  Respiratory: Negative for shortness of breath and wheezing. Positive for occasional cough with phlegm.  Cardiovascular: Negative for chest pain, palpitations, leg swelling.  Gastrointestinal:  Negative for nausea, vomiting, abdominal pain. he has constipation. Positive for heartburn and has poor appetite. Patient doesn't remember when his last bowel movement. His son stated that he had a bowel movement on Sunday.   Genitourinary: Negative for flank pain.   Musculoskeletal: Negative for back pain, fall in the facility  Skin: Negative for itching, rash.  Neurological: Negative for dizziness. Positive for poor balance. Has history of peripheral neuropathy. Psychiatric/Behavioral: Negative for depression.   Past Medical History:  Diagnosis Date  . ABUSE, ALCOHOL, IN REMISSION 06/17/2007  . Adenomatous colon polyp 01/1984  . Alcoholic cirrhosis of liver ([place]) 06/17/2007  . Aortic regurgitation 03/11/2016  . BLADDER CANCER 06/17/2007  . Cardiomyopathy ([place]) 03/11/2016  . CAROTID ARTERY DISEASE 07/23/2009  . COMPRESSION FRACTURE, LUMBAR VERTEBRAE 08/09/2009  . COPD 09/18/2008  . DIVERTICULOSIS, COLON 06/17/2007  . ECHOCARDIOGRAM, ABNORMAL 07/06/2008  . GERD 06/17/2007  . GLUCOSE INTOLERANCE 06/17/2007  . Hiatal hernia   . HYPERLIPIDEMIA 06/17/2007  . HYPERTENSION 06/17/2007  . Impaired glucose tolerance 12/13/2010  . Internal hemorrhoids   . LOW BACK PAIN, CHRONIC 12/17/2009  . OSTEOARTHRITIS, KNEES, BILATERAL 06/19/2009  . OSTEOPOROSIS 06/17/2007  . PEPTIC ULCER DISEASE 06/17/2007  . PSA, INCREASED 09/18/2008  . RENAL INSUFFICIENCY 09/18/2008  . Tubulovillous adenoma 04/2000   Duodenal polyp  . UNSPECIFIED SUDDEN HEARING LOSS 03/20/2009  . VITAMIN D DEFICIENCY 09/18/2008   Past Surgical History:  Procedure Laterality Date  . CHOLECYSTECTOMY  04/2000  . inguinal herniorrhapy    . LUMBAR LAMINECTOMY     x 2  . s/p Bilroth II    . s/p  bowel obstruction surgury  12/01  . s/p left middle ear surgury  late 66's   Dr. Juanell Fairly  . TIBIA IM NAIL INSERTION Left 06/22/2016   Procedure: INTRAMEDULLARY (IM) NAIL TIBIAL;  Surgeon: Leandrew Koyanagi, MD;  Location: [facility];  Service:  Orthopedics;  Laterality: Left;   Social History:   reports that he quit smoking about 11 years ago. He has never used smokeless tobacco. He reports that he does not drink alcohol or use drugs.  Family History  Problem Relation Age of Onset  . Diabetes Other     5 siblings  . Heart attack Mother   . Emphysema Father   . Breast cancer Sister     Medications:   Medication List       Accurate as of 07/09/16 10:49 AM. Always use your most recent med list.          calcium carbonate 500 MG chewable tablet Commonly known as:  TUMS - dosed in mg elemental calcium Chew 1 tablet by mouth daily.   cholecalciferol 1000 units tablet Commonly known as:  VITAMIN D Take 1,000 Units by mouth daily.   DECUBI-VITE Caps Take 1 capsule by mouth daily.   feeding supplement (PRO-STAT SUGAR FREE 64) Liqd Take 30 mLs by mouth 2 (two) times daily.   omeprazole 20 MG capsule Commonly known as:  PRILOSEC Take 1 capsule (20 mg total) by mouth 2 (two) times daily before a meal.   oxyCODONE 5 MG immediate release tablet Commonly known as:  ROXICODONE Take 1 tablet (5 mg total) by mouth every 6 (six) hours as needed.   polyethylene glycol packet Commonly known as:  MIRALAX / GLYCOLAX Take 17 g by mouth daily.   senna 8.6 MG tablet Commonly known as:  SENOKOT Take 2 tablets by mouth at bedtime.   UNABLE TO FIND Med Name: Med pass 120 cc by mouth 2 times daily between meals       Immunizations: Immunization History  Administered Date(s) Administered  . Influenza Whole 06/27/1998, 05/31/2007, 05/04/2008, 04/27/2009, 06/17/2010  . Influenza, High Dose Seasonal PF 05/08/2014  . Influenza-Unspecified 04/12/2013  . PPD Test 06/25/2016  . Pneumococcal Conjugate-13 08/05/2013  . Pneumococcal Polysaccharide-23 07/28/1994, 06/20/2008  . Td 06/27/1998, 06/20/2008     Physical Exam: Vitals:   07/09/16 1033  BP: (!) 115/46  Pulse: 66  Resp: 20  Temp: 97 F (36.1 C)  TempSrc: Oral    SpO2: 97%  Weight: 106 lb (48.1 kg)  Height: 5\' 10"  (1.778 m)   Body mass index is 15.21 kg/m.  General- elderly frail male, thin built, in no acute distress Head- normocephalic, atraumatic Nose- no maxillary or frontal sinus tenderness, no nasal discharge Throat- moist mucus membrane, edentulous, has dentures  Eyes- PERRLA, EOMI, no pallor, no icterus Neck- no cervical lymphadenopathy Cardiovascular- irregular heart rate, no murmur Respiratory- bilateral clear to auscultation, no wheeze, no rhonchi, no crackles, no use of accessory muscles, 2 l o2 by [place] Abdomen- bowel sounds present, soft, non tender, condom catheter in place Musculoskeletal- left leg in immobilizer, able to move his toes Neurological- alert and oriented to person, place and time Skin- warm and dry, easy bruising Psychiatry- normal mood and affect   Labs reviewed: Basic Metabolic Panel:  Recent Labs  07/05/16 0405 07/06/16 0355 07/07/16 0429  NA 129* 129* 131*  K 4.4 4.0 4.0  CL 98* 100* 102  CO2 25 20* 25  GLUCOSE 108* 90 104*  BUN 30* 25* 20  CREATININE 0.81 0.81 0.87  CALCIUM 8.1* 8.0* 8.0*   Liver Function Tests:  Recent Labs  08/24/15 1642 02/27/16 1538 06/21/16 0404 06/27/16  AST 25 29 36 22  ALT 11 14 18  7*  ALKPHOS 186* 144* 119 129*  BILITOT 0.4 0.5 0.6  --   PROT 7.7 7.5 5.5*  --   ALBUMIN 3.8 3.9 2.8*  --    No results for input(s): LIPASE, AMYLASE in the last 8760 hours. No results for input(s): AMMONIA in the last 8760 hours. CBC:  Recent Labs  02/27/16 1538 06/20/16 2019  07/04/16 1521  07/05/16 0405  07/06/16 0355 07/06/16 1345 07/07/16 0429  WBC 8.0 9.5  < > 10.1  --  11.7*  --  8.5  --  7.8  NEUTROABS 5.3 7.0  --  8.2*  --   --   --   --   --   --   HGB 12.0* 10.6*  < > 8.9*  < > 9.3*  < > 8.6* 9.1* 8.6*  HCT 35.3* 32.5*  < > 26.1*  < > 27.8*  < > 25.6* 26.8* 25.4*  MCV 95.3 98.8  < > 93.9  --  96.9  --  93.1  --  96.2  PLT 163.0 190  < > 268  --  288  --  242   --  253  < > = values in this interval not displayed. Cardiac Enzymes:  Recent Labs  02/27/16 1538  CKTOTAL 38   BNP: Invalid input(s): POCBNP CBG: No results for input(s): GLUCAP in the last 8760 hours.  Radiological Exams: Dg Chest 1 View  Result Date: 06/20/2016 CLINICAL DATA:  82 year old male status post fall today. Initial encounter. EXAM: CHEST 1 VIEW COMPARISON:  02/27/2016, levin 11/15/2007. FINDINGS: Right upper extremity artifact projecting over the diaphragm. Cardiac silhouette has increased on this PA view compared to August. Other mediastinal contours are within normal limits. Chronic hyperinflation. No pneumothorax, pulmonary edema, pleural effusion or confluent pulmonary opacity. EKG button artifact projecting in the right upper lung. Osteopenia. Previously augmented upper lumbar compression fracture. No acute osseous abnormality identified. IMPRESSION: 1. Cardiac silhouette appears mildly increased since August. Consider pericardial effusion. 2. No other acute cardiopulmonary abnormality. Chronic hyperinflation. 3. Right upper extremity artifact projecting over the diaphragm. Electronically Signed   By: Genevie Ann M.D.   On: 06/20/2016 21:10   Dg Knee 2 Views Left  Result Date: 06/20/2016 CLINICAL DATA:  Patient fell today. Swelling in the left knee. Lower leg feels numb. Not able to straighten knee. Severe pain. EXAM: LEFT KNEE - 1-2 VIEW COMPARISON:  None. FINDINGS: Diffuse bone demineralization. Postoperative changes with hardware fixation in the femoral metaphysis, incompletely included within the field of view. Oblique probably comminuted fractures of the left proximal tibial meta diaphysis with about 7 mm lateral displacement of the distal fracture fragment. Alignment appears intact. Slight cortical irregularity at the junction of the proximal fibular head neck is incompletely visualized but could also represent fibular fracture. Soft tissue swelling. Prominent  degenerative changes in the left knee with chondrocalcinosis and small osteochondral defect in the left medial femoral condyle. No significant effusion. Vascular calcifications. IMPRESSION: Diffuse bone demineralization with acute mildly displaced fracture of the proximal left tibia at the junction of the metaphysis and shaft. Probable nondisplaced fracture of the proximal left fibula. Electronically Signed   By: Lucienne Capers M.D.   On: 06/20/2016 21:10   Dg Tibia/fibula Left  Result Date: 06/22/2016 CLINICAL DATA:  Post  left tibial fracture repair EXAM: DG C-ARM 61-120 MIN; LEFT TIBIA AND FIBULA - 2 VIEW COMPARISON:  06/20/2016 FINDINGS: Seven views of the left tibia and fibula submitted. The patient is status post intraoperative repair of proximal left tibial fracture. There is intra medullary rod and 3 metallic fixation screws are noted in proximal left tibia. Two metallic fixation screws are noted in and distal tibia. There is improvement in alignment. Again noted minimal displaced fracture in proximal left fibula with improvement in alignment. IMPRESSION: The patient is status post intraoperative repair of proximal left tibial fracture. There is intra medullary rod and 3 metallic fixation screws are noted in proximal left tibia. Two metallic fixation screws are noted in and distal tibia. There is improvement in alignment. Again noted minimal displaced fracture in proximal left fibula with improvement in alignment. Fluoroscopy time was 2 minutes 25 seconds. Please see the operative report. Electronically Signed   By: Lahoma Crocker M.D.   On: 06/22/2016 13:32   Dg Tibia/fibula Left  Result Date: 06/20/2016 CLINICAL DATA:  [place] today with swelling in the left knee. Pain and limited range of motion. EXAM: LEFT TIBIA AND FIBULA - 2 VIEW COMPARISON:  None. FINDINGS: Diffuse bone demineralization. Acute oblique fracture of the proximal left tibia at the junction of the metaphysis and shaft. There is about 7  mm lateral displacement of the distal fracture fragment. Alignment is intact. Midshaft and distal tibia appear intact. Limited visualization of the proximal fibula. Possible fracture of the proximal fibula is not well evaluated on this study. See additional report of the left knee. Degenerative changes in the left knee and left ankle. Vascular calcifications throughout. IMPRESSION: Oblique fracture of the proximal left tibia with mild lateral displacement of the distal fracture fragment. Electronically Signed   By: Lucienne Capers M.D.   On: 06/20/2016 21:13   Dg Tibia/fibula Left Port  Result Date: 06/22/2016 CLINICAL DATA:  Status post ORIF of tibial fracture EXAM: PORTABLE LEFT TIBIA AND FIBULA - 2 VIEW COMPARISON:  06/20/2016 FINDINGS: Postsurgical changes are again seen in the distal femur stable from prior exam. A new tibial medullary rod with proximal and distal fixation screws is noted. The fracture fragments are in near anatomic alignment. IMPRESSION: Status post ORIF of tibial fracture. Electronically Signed   By: Inez Catalina M.D.   On: 06/22/2016 12:47   Dg C-arm 1-60 Min  Result Date: 06/22/2016 CLINICAL DATA:  Post left tibial fracture repair EXAM: DG C-ARM 61-120 MIN; LEFT TIBIA AND FIBULA - 2 VIEW COMPARISON:  06/20/2016 FINDINGS: Seven views of the left tibia and fibula submitted. The patient is status post intraoperative repair of proximal left tibial fracture. There is intra medullary rod and 3 metallic fixation screws are noted in proximal left tibia. Two metallic fixation screws are noted in and distal tibia. There is improvement in alignment. Again noted minimal displaced fracture in proximal left fibula with improvement in alignment. IMPRESSION: The patient is status post intraoperative repair of proximal left tibial fracture. There is intra medullary rod and 3 metallic fixation screws are noted in proximal left tibia. Two metallic fixation screws are noted in and distal tibia.  There is improvement in alignment. Again noted minimal displaced fracture in proximal left fibula with improvement in alignment. Fluoroscopy time was 2 minutes 25 seconds. Please see the operative report. Electronically Signed   By: Lahoma Crocker M.D.   On: 06/22/2016 13:32    Assessment/Plan  Physical deconditioning Will have him work with physical therapy  and occupational therapy team to help with gait training and muscle strengthening exercises.fall precautions. Skin care. Encourage to be out of bed.   Usteady gait Post fall with left tibial fracture and has neuropathy. Will have patient work with PT/OT as tolerated to regain strength and restore function.  Fall precautions are in place.  Left tibial fracture S/p surgical repair by IM nail. To follow with orthopedic 07/10/16. Will have him work with physical therapy and occupational therapy team to help with gait training and muscle strengthening exercises.fall precautions. Skin care. Encourage to be out of bed. Continue oxycodone IR 5 mg q6h prn pain. LLE NWB for now. Aspirin on hold for now with low Hb. Get PMR consult for pain management.   Blood loss anemia Recent surgery and hematochezia likely contributing. Monitor cbc. S/p blood transfusion last admission.   gerd Continue omeprazole 20 mg bid  Constipation Currently on senokot and miralax. Add dulcolax suppository every 3 days to help prevent hemorrhoidal bleed  Hyponatremia Monitor BMP  Severe protein calorie malnutrition Poor po intake, get RD consult, weekly weight monitor. Continue feeding supplement  Chronic afib Rate controlled. Off xarelto for now. Check cbc and if Hb >9, start baby aspirin.     Goals of care: short term rehabilitation   Labs/tests ordered: cbc, bmp 07/10/16  Family/ staff Communication: reviewed care plan with patient, his wife and son and nursing supervisor    Blanchie Serve, MD Internal Medicine [place], [place] [address] Cell Phone (Monday-Friday 8 am - 5 pm): [phone] On Call: [phone] and follow prompts after 5 pm and on weekends Office Phone: [phone] Office Fax: [phone]

## 2016-07-10 ENCOUNTER — Ambulatory Visit (INDEPENDENT_AMBULATORY_CARE_PROVIDER_SITE_OTHER): Payer: Medicare Other

## 2016-07-10 ENCOUNTER — Ambulatory Visit (INDEPENDENT_AMBULATORY_CARE_PROVIDER_SITE_OTHER): Payer: Medicare Other | Admitting: Orthopaedic Surgery

## 2016-07-10 DIAGNOSIS — S82192D Other fracture of upper end of left tibia, subsequent encounter for closed fracture with routine healing: Secondary | ICD-10-CM

## 2016-07-10 NOTE — Progress Notes (Signed)
The patient is 2 weeks status post intramedullary nailing of a left proximal tibia fracture. He is in a wheelchair full time. He is at a skilled nursing facility. He does not endorse much pain. The family states that he is not eating very well. Physical exam shows well-healed surgical incision. He does have some pressure ulcers from his bony anatomy. He is very thin and has very little subcutaneous tissue. The x-rays show stable fixation of the tibia fracture with no complications. We removed the staples today continue with nonweightbearing. I recommended a wound care center referral for the pressure ulcers I will see him back in 6 weeks with repeat 2 view x-rays of the left tibia

## 2016-07-14 DIAGNOSIS — M79609 Pain in unspecified limb: Secondary | ICD-10-CM | POA: Diagnosis not present

## 2016-07-14 LAB — BASIC METABOLIC PANEL
BUN: 21 mg/dL (ref 4–21)
CREATININE: 0.8 mg/dL (ref 0.6–1.3)
Glucose: 72 mg/dL
Potassium: 4 mmol/L (ref 3.4–5.3)
SODIUM: 136 mmol/L — AB (ref 137–147)

## 2016-07-14 LAB — CBC AND DIFFERENTIAL
HEMATOCRIT: 28 % — AB (ref 41–53)
Hemoglobin: 9.1 g/dL — AB (ref 13.5–17.5)
PLATELETS: 276 10*3/uL (ref 150–399)
WBC: 10.1 10^3/mL

## 2016-07-15 ENCOUNTER — Non-Acute Institutional Stay (SKILLED_NURSING_FACILITY): Payer: Medicare Other | Admitting: Family

## 2016-07-15 DIAGNOSIS — L89610 Pressure ulcer of right heel, unstageable: Secondary | ICD-10-CM

## 2016-07-15 DIAGNOSIS — L89620 Pressure ulcer of left heel, unstageable: Secondary | ICD-10-CM | POA: Diagnosis not present

## 2016-07-15 DIAGNOSIS — R630 Anorexia: Secondary | ICD-10-CM

## 2016-07-15 DIAGNOSIS — R634 Abnormal weight loss: Secondary | ICD-10-CM | POA: Diagnosis not present

## 2016-07-15 MED ORDER — MIRTAZAPINE 7.5 MG PO TABS: 7.5000 mg | ORAL_TABLET | Freq: Every day | ORAL | Status: DC

## 2016-07-15 NOTE — Progress Notes (Signed)
Location:  [place] Room Number: [place] of Service:  SNF [phone]) Provider: Dinah Ngetich FNP-C   Cathlean Cower, MD  Patient Care Team: Biagio Borg, MD as PCP - General  Extended Emergency Contact Information Primary Emergency Contact: [place] Harrie Jeans)  [place] Phone: [phone] Relation: Son Secondary Emergency Contact: Warriner,Frances M Address: [place], [place] Phone: [phone] Relation: Spouse  Code Status: Full Code  Goals of care: Advanced Directive information Advanced Directives 07/04/2016  Does Patient Have a Medical Advance Directive? No  Would patient like information on creating a medical advance directive? No - Patient declined     Chief Complaint  Patient presents with  . Acute Visit    loss of appetite     HPI:  Pt is a 82 y.o. male seen today at [facility] for an acute visit for evaluation of loss of appetite.He has a significant medical history of HTN, Afib,COPD, bladder cancer,Liver cirrhosis among other conditions. He is seen in his room today. He states has no appetite.Facility Registered Dietician currently following up due to protein malnutrition and weight loss.Has had progressive weight loss though weight discrepancy noted on facility log previous weight 106 lbs  ( 07/03/2016),145 lbs (07/09/2016) and 104 lbs (07/10/2016).Facility staff reports patient eats limited amounts of meals.    Past Medical History:  Diagnosis Date  . ABUSE, ALCOHOL, IN REMISSION 06/17/2007  . Adenomatous colon polyp 01/1984  . Alcoholic cirrhosis of liver ([place]) 06/17/2007  . Aortic regurgitation 03/11/2016  . BLADDER CANCER 06/17/2007  . Cardiomyopathy ([place]) 03/11/2016  . CAROTID ARTERY DISEASE 07/23/2009  . COMPRESSION FRACTURE, LUMBAR VERTEBRAE 08/09/2009  . COPD 09/18/2008  . DIVERTICULOSIS, COLON 06/17/2007  . ECHOCARDIOGRAM,  ABNORMAL 07/06/2008  . GERD 06/17/2007  . GLUCOSE INTOLERANCE 06/17/2007  . Hiatal hernia   . HYPERLIPIDEMIA 06/17/2007  . HYPERTENSION 06/17/2007  . Impaired glucose tolerance 12/13/2010  . Internal hemorrhoids   . LOW BACK PAIN, CHRONIC 12/17/2009  . OSTEOARTHRITIS, KNEES, BILATERAL 06/19/2009  . OSTEOPOROSIS 06/17/2007  . PEPTIC ULCER DISEASE 06/17/2007  . PSA, INCREASED 09/18/2008  . RENAL INSUFFICIENCY 09/18/2008  . Tubulovillous adenoma 04/2000   Duodenal polyp  . UNSPECIFIED SUDDEN HEARING LOSS 03/20/2009  . VITAMIN D DEFICIENCY 09/18/2008   Past Surgical History:  Procedure Laterality Date  . CHOLECYSTECTOMY  04/2000  . inguinal herniorrhapy    . LUMBAR LAMINECTOMY     x 2  . s/p Bilroth II    . s/p bowel obstruction surgury  12/01  . s/p left middle ear surgury  late 30's   Dr. Juanell Fairly  . TIBIA IM NAIL INSERTION Left 06/22/2016   Procedure: INTRAMEDULLARY (IM) NAIL TIBIAL;  Surgeon: Leandrew Koyanagi, MD;  Location: [facility];  Service: Orthopedics;  Laterality: Left;    Allergies  Allergen Reactions  . Amoxicillin-Pot Clavulanate     REACTION: rash  . Ibuprofen     REACTION: itching    Allergies as of 07/15/2016      Reactions   Amoxicillin-pot Clavulanate    REACTION: rash   Ibuprofen    REACTION: itching      Medication List       Accurate as of 07/15/16  3:20 PM. Always use your most recent med list.          calcium carbonate 500 MG chewable tablet Commonly known as:  TUMS - dosed in mg elemental calcium Chew 1 tablet by mouth daily.   cholecalciferol 1000 units tablet Commonly known as:  VITAMIN D Take 1,000 Units by mouth daily.   DECUBI-VITE Caps Take 1 capsule by mouth daily.   feeding supplement (PRO-STAT SUGAR FREE 64) Liqd Take 30 mLs by mouth 2 (two) times daily.   mirtazapine 7.5 MG tablet Commonly known as:  REMERON Take 1 tablet (7.5 mg total) by mouth at bedtime.   omeprazole 20 MG capsule Commonly known as:  PRILOSEC Take 1  capsule (20 mg total) by mouth 2 (two) times daily before a meal.   oxyCODONE 5 MG immediate release tablet Commonly known as:  ROXICODONE Take 1 tablet (5 mg total) by mouth every 6 (six) hours as needed.   polyethylene glycol packet Commonly known as:  MIRALAX / GLYCOLAX Take 17 g by mouth daily.   senna 8.6 MG tablet Commonly known as:  SENOKOT Take 2 tablets by mouth at bedtime.   UNABLE TO FIND Med Name: Med pass 120 cc by mouth 2 times daily between meals       Review of Systems  Constitutional: Positive for appetite change. Negative for activity change, chills and fever.  HENT: Negative for congestion, rhinorrhea, sinus pressure, sneezing and sore throat.   Eyes: Negative.   Respiratory: Negative for cough, chest tightness, shortness of breath and wheezing.   Cardiovascular: Negative for chest pain, palpitations and leg swelling.  Gastrointestinal: Negative for abdominal distention, abdominal pain, constipation, diarrhea, nausea and vomiting.  Genitourinary: Negative for dysuria, flank pain, frequency and urgency.  Musculoskeletal: Positive for gait problem.       Left leg pain under control with current regimen.  Skin: Negative for color change, pallor and rash.  Neurological: Negative for dizziness, seizures, light-headedness and headaches.  Psychiatric/Behavioral: Negative for agitation, confusion and sleep disturbance. The patient is not nervous/anxious.     Immunization History  Administered Date(s) Administered  . Influenza Whole 06/27/1998, 05/31/2007, 05/04/2008, 04/27/2009, 06/17/2010  . Influenza, High Dose Seasonal PF 05/08/2014  . Influenza-Unspecified 04/12/2013  . PPD Test 06/25/2016  . Pneumococcal Conjugate-13 08/05/2013  . Pneumococcal Polysaccharide-23 07/28/1994, 06/20/2008  . Td 06/27/1998, 06/20/2008   Pertinent  Health Maintenance Due  Topic Date Due  . INFLUENZA VACCINE  02/26/2016  . PNA vac Low Risk Adult  Completed   Fall Risk   08/24/2015 02/02/2014 08/05/2013  Falls in the past year? Yes Yes No  Number falls in past yr: 1 1 -  Injury with Fall? No No -      Vitals:   07/15/16 1000  BP: (!) 144/70  Pulse: 70  Resp: 18  Temp: 98.7 F (37.1 C)  SpO2: 98%  Weight: 104 lb (47.2 kg)  Height: 5\' 10"  (1.778 m)   Body mass index is 14.92 kg/m. Physical Exam  Constitutional: No distress.  Thin frail elderly   HENT:  Head: Normocephalic.  Mouth/Throat: Oropharynx is clear and moist. No oropharyngeal exudate.  Eyes: Conjunctivae and EOM are normal. Pupils are equal, round, and reactive to light. Right eye exhibits no discharge. Left eye exhibits no discharge. No scleral icterus.  Neck: Normal range of motion. No JVD present. No thyromegaly present.  Cardiovascular: Normal rate and intact distal pulses.  Exam reveals no gallop and no friction rub.   No murmur heard. irregular  Pulmonary/Chest: Effort normal and breath sounds normal. No respiratory distress. He has no wheezes. He has no rales.  Abdominal: Soft. Bowel sounds are  normal. He exhibits no distension. There is no tenderness. There is no rebound and no guarding.  Genitourinary:  Genitourinary Comments: Incontinent   Musculoskeletal: He exhibits no edema, tenderness or deformity.  Left full leg brace off during visit    Lymphadenopathy:    He has no cervical adenopathy.  Neurological: He is alert.  Skin: Skin is warm and dry. No rash noted. No erythema. No pallor.  Bilateral heels unstagable pressure ulcer  Psychiatric: He has a normal mood and affect.    Labs reviewed:  Recent Labs  07/05/16 0405 07/06/16 0355 07/07/16 0429  NA 129* 129* 131*  K 4.4 4.0 4.0  CL 98* 100* 102  CO2 25 20* 25  GLUCOSE 108* 90 104*  BUN 30* 25* 20  CREATININE 0.81 0.81 0.87  CALCIUM 8.1* 8.0* 8.0*    Recent Labs  08/24/15 1642 02/27/16 1538 06/21/16 0404 06/27/16  AST 25 29 36 22  ALT 11 14 18  7*  ALKPHOS 186* 144* 119 129*  BILITOT 0.4 0.5 0.6  --     PROT 7.7 7.5 5.5*  --   ALBUMIN 3.8 3.9 2.8*  --     Recent Labs  02/27/16 1538 06/20/16 2019  07/04/16 1521  07/05/16 0405  07/06/16 0355 07/06/16 1345 07/07/16 0429  WBC 8.0 9.5  < > 10.1  --  11.7*  --  8.5  --  7.8  NEUTROABS 5.3 7.0  --  8.2*  --   --   --   --   --   --   HGB 12.0* 10.6*  < > 8.9*  < > 9.3*  < > 8.6* 9.1* 8.6*  HCT 35.3* 32.5*  < > 26.1*  < > 27.8*  < > 25.6* 26.8* 25.4*  MCV 95.3 98.8  < > 93.9  --  96.9  --  93.1  --  96.2  PLT 163.0 190  < > 268  --  288  --  242  --  253  < > = values in this interval not displayed. Lab Results  Component Value Date   TSH 2.05 02/27/2016   Lab Results  Component Value Date   HGBA1C 5.7 08/24/2015   Lab Results  Component Value Date   CHOL 151 02/27/2016   HDL 44.60 02/27/2016   LDLCALC 85 02/27/2016   TRIG 109.0 02/27/2016   CHOLHDL 3 02/27/2016   Assessment/Plan 1. Loss of appetite Facility staff reports patient eats limited amounts of meals. Loss of appetite reported. Willing to try appetite stimulant. Will start Remeron 7.5 mg Tablet at bedtime. Increase dose to 15 mg tablet if no improvement. Continue to monitor.   2. Weight loss Has had progressive weight loss though weight discrepancy noted on facility log previous weight 106 lbs  ( 07/03/2016),145 lbs (07/09/2016) and 104 lbs (07/10/2016). Signs of weight loss noted.Continue with protein supplements. Registered Dietician to continue to monitor.   3. Unstagable ulcer heels   Bilateral heels eschar noted surrounding tissue without any signs of infections. Continue alternating air mattress, Protein supplements and wound care.    Family/ staff Communication: Reviewed plan of care with patient and facility Nurse supervisor.   Labs/tests ordered:  None

## 2016-07-17 ENCOUNTER — Encounter: Payer: Self-pay | Admitting: Family

## 2016-07-17 ENCOUNTER — Non-Acute Institutional Stay (SKILLED_NURSING_FACILITY): Payer: Medicare Other | Admitting: Family

## 2016-07-17 DIAGNOSIS — R05 Cough: Secondary | ICD-10-CM | POA: Diagnosis not present

## 2016-07-17 DIAGNOSIS — I959 Hypotension, unspecified: Secondary | ICD-10-CM

## 2016-07-17 DIAGNOSIS — R059 Cough, unspecified: Secondary | ICD-10-CM

## 2016-07-17 DIAGNOSIS — J449 Chronic obstructive pulmonary disease, unspecified: Secondary | ICD-10-CM

## 2016-07-17 NOTE — Progress Notes (Signed)
Patient ID: Sean [name], male   DOB: 1925-05-11, 82 y.o.   MRN: [id number]  Location:  [place] Room Number: [place] of Service:  SNF [phone]) Provider: Dinah Ngetich FNP-C   Cathlean Cower, MD  Patient Care Team: Biagio Borg, MD as PCP - General  Extended Emergency Contact Information Primary Emergency Contact: [place] Harrie Jeans)  [place] Phone: [phone] Relation: Son Secondary Emergency Contact: Nims,Frances M Address: [place], [place] Phone: [phone] Relation: Spouse  Code Status:  Full Code  Goals of care: Advanced Directive information Advanced Directives 07/17/2016  Does Patient Have a Medical Advance Directive? No  Would patient like information on creating a medical advance directive? No - Patient declined     Chief Complaint  Patient presents with  . Acute Visit    Low O2 stats and low B/P    HPI:  Pt is a 82 y.o. male seen today at [facility]  for an acute visit for evaluation of hypoxia and hypotension. He is seen in his room today sitting up on his wheelchair bedside the bed.He has a significant medical history of COPD, Hypertension , Afib, CKD among other conditions. Facility Nurse reports patient's oxygen saturation were 88 % and Blood pressure 96/48. He was also noted to be confused and had shortness of breath previous day. Oxygen saturations has improved above 90's on Oxygen 2 Liters [place] and no shortness of breath. He denies any acute issues this visit. His appetite continues to be diminished. Remeron started.    Past Medical History:  Diagnosis Date  . ABUSE, ALCOHOL, IN REMISSION 06/17/2007  . Adenomatous colon polyp 01/1984  . Alcoholic cirrhosis of liver ([place]) 06/17/2007  . Aortic regurgitation 03/11/2016  . BLADDER CANCER 06/17/2007  . Cardiomyopathy ([place]) 03/11/2016  . CAROTID ARTERY DISEASE 07/23/2009  .  COMPRESSION FRACTURE, LUMBAR VERTEBRAE 08/09/2009  . COPD 09/18/2008  . DIVERTICULOSIS, COLON 06/17/2007  . ECHOCARDIOGRAM, ABNORMAL 07/06/2008  . GERD 06/17/2007  . GLUCOSE INTOLERANCE 06/17/2007  . Hiatal hernia   . HYPERLIPIDEMIA 06/17/2007  . HYPERTENSION 06/17/2007  . Impaired glucose tolerance 12/13/2010  . Internal hemorrhoids   . LOW BACK PAIN, CHRONIC 12/17/2009  . OSTEOARTHRITIS, KNEES, BILATERAL 06/19/2009  . OSTEOPOROSIS 06/17/2007  . PEPTIC ULCER DISEASE 06/17/2007  . PSA, INCREASED 09/18/2008  . RENAL INSUFFICIENCY 09/18/2008  . Tubulovillous adenoma 04/2000   Duodenal polyp  . UNSPECIFIED SUDDEN HEARING LOSS 03/20/2009  . VITAMIN D DEFICIENCY 09/18/2008   Past Surgical History:  Procedure Laterality Date  . CHOLECYSTECTOMY  04/2000  . inguinal herniorrhapy    . LUMBAR LAMINECTOMY     x 2  . s/p Bilroth II    . s/p bowel obstruction surgury  12/01  . s/p left middle ear surgury  late 84's   Dr. Juanell Fairly  . TIBIA IM NAIL INSERTION Left 06/22/2016   Procedure: INTRAMEDULLARY (IM) NAIL TIBIAL;  Surgeon: Leandrew Koyanagi, MD;  Location: [facility];  Service: Orthopedics;  Laterality: Left;    Allergies  Allergen Reactions  . Amoxicillin-Pot Clavulanate     REACTION: rash  . Ibuprofen     REACTION: itching    Allergies as of 07/17/2016      Reactions   Amoxicillin-pot Clavulanate    REACTION: rash   Ibuprofen    REACTION: itching      Medication List  Accurate as of 07/17/16 11:45 AM. Always use your most recent med list.          calcium carbonate 500 MG chewable tablet Commonly known as:  TUMS - dosed in mg elemental calcium Chew 1 tablet by mouth daily.   cholecalciferol 1000 units tablet Commonly known as:  VITAMIN D Take 1,000 Units by mouth daily.   DECUBI-VITE Caps Take 1 capsule by mouth daily.   feeding supplement (PRO-STAT SUGAR FREE 64) Liqd Take 30 mLs by mouth 2 (two) times daily.   mirtazapine 7.5 MG tablet Commonly known as:   REMERON Take 1 tablet (7.5 mg total) by mouth at bedtime.   omeprazole 20 MG capsule Commonly known as:  PRILOSEC Take 1 capsule (20 mg total) by mouth 2 (two) times daily before a meal.   oxyCODONE 5 MG immediate release tablet Commonly known as:  ROXICODONE Take 1 tablet (5 mg total) by mouth every 6 (six) hours as needed.   polyethylene glycol packet Commonly known as:  MIRALAX / GLYCOLAX Take 17 g by mouth daily.   senna 8.6 MG tablet Commonly known as:  SENOKOT Take 2 tablets by mouth at bedtime.   UNABLE TO FIND Med Name: Med pass 240 cc by mouth 2 times daily between meals       Review of Systems  Constitutional: Positive for appetite change. Negative for activity change, chills, fatigue and fever.  HENT: Negative for congestion, rhinorrhea, sinus pressure, sneezing and sore throat.   Eyes: Negative.   Respiratory: Positive for cough. Negative for chest tightness, shortness of breath and wheezing.   Cardiovascular: Negative for chest pain, palpitations and leg swelling.  Gastrointestinal: Negative for abdominal distention, abdominal pain, constipation, diarrhea, nausea and vomiting.  Genitourinary: Negative for dysuria, flank pain, frequency and urgency.  Musculoskeletal: Positive for gait problem.       Left leg pain under control with current regimen.Full left leg brace in place.   Skin: Negative for color change, pallor and rash.  Neurological: Negative for dizziness, seizures, light-headedness and headaches.  Psychiatric/Behavioral: Negative for agitation, confusion and sleep disturbance. The patient is not nervous/anxious.     Immunization History  Administered Date(s) Administered  . Influenza Whole 06/27/1998, 05/31/2007, 05/04/2008, 04/27/2009, 06/17/2010  . Influenza, High Dose Seasonal PF 05/08/2014  . Influenza-Unspecified 04/12/2013  . PPD Test 06/25/2016  . Pneumococcal Conjugate-13 08/05/2013  . Pneumococcal Polysaccharide-23 07/28/1994, 06/20/2008    . Td 06/27/1998, 06/20/2008   Pertinent  Health Maintenance Due  Topic Date Due  . INFLUENZA VACCINE  02/26/2016  . PNA vac Low Risk Adult  Completed   Fall Risk  08/24/2015 02/02/2014 08/05/2013  Falls in the past year? Yes Yes No  Number falls in past yr: 1 1 -  Injury with Fall? No No -      Vitals:   07/17/16 1140  BP: (!) 99/47  Pulse: 64  Resp: (!) 24  Temp: 98.9 F (37.2 C)  TempSrc: Oral  SpO2: 94%  Weight: 104 lb (47.2 kg)   Body mass index is 14.92 kg/m. Physical Exam  Constitutional: No distress.  Thin frail elderly   HENT:  Head: Normocephalic.  Mouth/Throat: Oropharynx is clear and moist. No oropharyngeal exudate.  Eyes: Conjunctivae and EOM are normal. Pupils are equal, round, and reactive to light. Right eye exhibits no discharge. Left eye exhibits no discharge. No scleral icterus.  Neck: Normal range of motion. No JVD present. No thyromegaly present.  Cardiovascular: Normal rate and intact distal pulses.  Exam reveals no gallop and no friction rub.   No murmur heard. irregular  Pulmonary/Chest: Effort normal and breath sounds normal. No respiratory distress. He has no wheezes. He has no rales.  Abdominal: Soft. Bowel sounds are normal. He exhibits no distension. There is no tenderness. There is no rebound and no guarding.  Genitourinary:  Genitourinary Comments: Incontinent   Musculoskeletal: He exhibits no edema, tenderness or deformity.  Left full leg brace in place. Unsteady gait.   Lymphadenopathy:    He has no cervical adenopathy.  Neurological: He is alert.  Skin: Skin is warm and dry. No rash noted. No erythema. No pallor.  Bilateral heels unstagable pressure ulcer  Psychiatric: He has a normal mood and affect.    Labs reviewed:  Recent Labs  07/05/16 0405 07/06/16 0355 07/07/16 0429 07/14/16  NA 129* 129* 131* 136*  K 4.4 4.0 4.0 4.0  CL 98* 100* 102  --   CO2 25 20* 25  --   GLUCOSE 108* 90 104*  --   BUN 30* 25* 20 21  CREATININE  0.81 0.81 0.87 0.8  CALCIUM 8.1* 8.0* 8.0*  --     Recent Labs  08/24/15 1642 02/27/16 1538 06/21/16 0404 06/27/16  AST 25 29 36 22  ALT 11 14 18  7*  ALKPHOS 186* 144* 119 129*  BILITOT 0.4 0.5 0.6  --   PROT 7.7 7.5 5.5*  --   ALBUMIN 3.8 3.9 2.8*  --     Recent Labs  02/27/16 1538 06/20/16 2019  07/04/16 1521  07/05/16 0405  07/06/16 0355 07/06/16 1345 07/07/16 0429 07/14/16  WBC 8.0 9.5  < > 10.1  --  11.7*  --  8.5  --  7.8 10.1  NEUTROABS 5.3 7.0  --  8.2*  --   --   --   --   --   --   --   HGB 12.0* 10.6*  < > 8.9*  < > 9.3*  < > 8.6* 9.1* 8.6* 9.1*  HCT 35.3* 32.5*  < > 26.1*  < > 27.8*  < > 25.6* 26.8* 25.4* 28*  MCV 95.3 98.8  < > 93.9  --  96.9  --  93.1  --  96.2  --   PLT 163.0 190  < > 268  --  288  --  242  --  253 276  < > = values in this interval not displayed. Lab Results  Component Value Date   TSH 2.05 02/27/2016   Lab Results  Component Value Date   HGBA1C 5.7 08/24/2015   Lab Results  Component Value Date   CHOL 151 02/27/2016   HDL 44.60 02/27/2016   LDLCALC 85 02/27/2016   TRIG 109.0 02/27/2016   CHOLHDL 3 02/27/2016   Assessment/Plan  Cough  Afebrile. Will obtain portable CXR rule out pneumonia.   Hypotension  Suspect due to current pain medication. Will reevaluate pain. Start schedule Tylenol then consider Tramadol 50 mg Tablet 1/2 tablet ( 25 mg ) every 6 hours as needed for pain .   COPD Hypoxia reported previous day. Oxygen saturations now above 90's. Continue on oxygen 2 liters [place]. Continue Neb treatment. CXR as above.   Family/ staff Communication: Reviewed plan of care with patient and facility Nurse supervisor.   Labs/tests ordered: None

## 2016-07-23 ENCOUNTER — Telehealth (INDEPENDENT_AMBULATORY_CARE_PROVIDER_SITE_OTHER): Payer: Self-pay | Admitting: *Deleted

## 2016-07-23 DIAGNOSIS — M79609 Pain in unspecified limb: Secondary | ICD-10-CM | POA: Diagnosis not present

## 2016-07-23 NOTE — Telephone Encounter (Signed)
Faxed to [phone]

## 2016-07-23 NOTE — Telephone Encounter (Signed)
Pt father called stating pt is going to wound healing center in [place] and needs office notes and surgical notes faxed. Fax: [phone]

## 2016-07-24 ENCOUNTER — Emergency Department (HOSPITAL_COMMUNITY): Payer: Medicare Other

## 2016-07-24 ENCOUNTER — Emergency Department (HOSPITAL_COMMUNITY)
Admission: EM | Admit: 2016-07-24 | Discharge: 2016-07-24 | Disposition: A | Discharge status: Home / Self Care | Payer: Medicare Other | Attending: Emergency Medicine | Admitting: Emergency Medicine

## 2016-07-24 ENCOUNTER — Encounter (HOSPITAL_COMMUNITY): Payer: Self-pay | Admitting: *Deleted

## 2016-07-24 ENCOUNTER — Ambulatory Visit: Payer: Medicare Other | Admitting: Surgery

## 2016-07-24 DIAGNOSIS — J9621 Acute and chronic respiratory failure with hypoxia: Secondary | ICD-10-CM | POA: Diagnosis not present

## 2016-07-24 DIAGNOSIS — L89520 Pressure ulcer of left ankle, unstageable: Secondary | ICD-10-CM | POA: Diagnosis not present

## 2016-07-24 DIAGNOSIS — R300 Dysuria: Secondary | ICD-10-CM | POA: Diagnosis not present

## 2016-07-24 DIAGNOSIS — E872 Acidosis: Secondary | ICD-10-CM | POA: Diagnosis present

## 2016-07-24 DIAGNOSIS — N189 Chronic kidney disease, unspecified: Secondary | ICD-10-CM | POA: Insufficient documentation

## 2016-07-24 DIAGNOSIS — R634 Abnormal weight loss: Secondary | ICD-10-CM | POA: Diagnosis not present

## 2016-07-24 DIAGNOSIS — Z9981 Dependence on supplemental oxygen: Secondary | ICD-10-CM | POA: Diagnosis not present

## 2016-07-24 DIAGNOSIS — N170 Acute kidney failure with tubular necrosis: Secondary | ICD-10-CM | POA: Diagnosis present

## 2016-07-24 DIAGNOSIS — R0902 Hypoxemia: Secondary | ICD-10-CM | POA: Diagnosis not present

## 2016-07-24 DIAGNOSIS — I480 Paroxysmal atrial fibrillation: Secondary | ICD-10-CM | POA: Diagnosis present

## 2016-07-24 DIAGNOSIS — D62 Acute posthemorrhagic anemia: Secondary | ICD-10-CM | POA: Diagnosis not present

## 2016-07-24 DIAGNOSIS — A419 Sepsis, unspecified organism: Secondary | ICD-10-CM | POA: Diagnosis not present

## 2016-07-24 DIAGNOSIS — G9341 Metabolic encephalopathy: Secondary | ICD-10-CM | POA: Diagnosis present

## 2016-07-24 DIAGNOSIS — L89151 Pressure ulcer of sacral region, stage 1: Secondary | ICD-10-CM | POA: Diagnosis not present

## 2016-07-24 DIAGNOSIS — K219 Gastro-esophageal reflux disease without esophagitis: Secondary | ICD-10-CM | POA: Diagnosis present

## 2016-07-24 DIAGNOSIS — S82102D Unspecified fracture of upper end of left tibia, subsequent encounter for closed fracture with routine healing: Secondary | ICD-10-CM | POA: Diagnosis not present

## 2016-07-24 DIAGNOSIS — Z87891 Personal history of nicotine dependence: Secondary | ICD-10-CM | POA: Insufficient documentation

## 2016-07-24 DIAGNOSIS — J449 Chronic obstructive pulmonary disease, unspecified: Secondary | ICD-10-CM | POA: Diagnosis not present

## 2016-07-24 DIAGNOSIS — R2689 Other abnormalities of gait and mobility: Secondary | ICD-10-CM | POA: Diagnosis not present

## 2016-07-24 DIAGNOSIS — Z8711 Personal history of peptic ulcer disease: Secondary | ICD-10-CM | POA: Diagnosis not present

## 2016-07-24 DIAGNOSIS — R079 Chest pain, unspecified: Secondary | ICD-10-CM | POA: Diagnosis not present

## 2016-07-24 DIAGNOSIS — R6521 Severe sepsis with septic shock: Secondary | ICD-10-CM | POA: Diagnosis present

## 2016-07-24 DIAGNOSIS — I129 Hypertensive chronic kidney disease with stage 1 through stage 4 chronic kidney disease, or unspecified chronic kidney disease: Secondary | ICD-10-CM | POA: Insufficient documentation

## 2016-07-24 DIAGNOSIS — I10 Essential (primary) hypertension: Secondary | ICD-10-CM | POA: Diagnosis present

## 2016-07-24 DIAGNOSIS — Z515 Encounter for palliative care: Secondary | ICD-10-CM | POA: Diagnosis not present

## 2016-07-24 DIAGNOSIS — I2510 Atherosclerotic heart disease of native coronary artery without angina pectoris: Secondary | ICD-10-CM | POA: Insufficient documentation

## 2016-07-24 DIAGNOSIS — I429 Cardiomyopathy, unspecified: Secondary | ICD-10-CM | POA: Diagnosis present

## 2016-07-24 DIAGNOSIS — J440 Chronic obstructive pulmonary disease with acute lower respiratory infection: Secondary | ICD-10-CM | POA: Diagnosis present

## 2016-07-24 DIAGNOSIS — R8290 Unspecified abnormal findings in urine: Secondary | ICD-10-CM | POA: Diagnosis not present

## 2016-07-24 DIAGNOSIS — K7030 Alcoholic cirrhosis of liver without ascites: Secondary | ICD-10-CM | POA: Diagnosis present

## 2016-07-24 DIAGNOSIS — M6281 Muscle weakness (generalized): Secondary | ICD-10-CM | POA: Diagnosis not present

## 2016-07-24 DIAGNOSIS — E785 Hyperlipidemia, unspecified: Secondary | ICD-10-CM | POA: Diagnosis present

## 2016-07-24 DIAGNOSIS — Z833 Family history of diabetes mellitus: Secondary | ICD-10-CM | POA: Diagnosis not present

## 2016-07-24 DIAGNOSIS — L89159 Pressure ulcer of sacral region, unspecified stage: Secondary | ICD-10-CM | POA: Diagnosis present

## 2016-07-24 DIAGNOSIS — Z681 Body mass index (BMI) 19 or less, adult: Secondary | ICD-10-CM | POA: Diagnosis not present

## 2016-07-24 DIAGNOSIS — L97522 Non-pressure chronic ulcer of other part of left foot with fat layer exposed: Secondary | ICD-10-CM | POA: Diagnosis not present

## 2016-07-24 DIAGNOSIS — F1011 Alcohol abuse, in remission: Secondary | ICD-10-CM | POA: Diagnosis not present

## 2016-07-24 DIAGNOSIS — Z8551 Personal history of malignant neoplasm of bladder: Secondary | ICD-10-CM | POA: Insufficient documentation

## 2016-07-24 DIAGNOSIS — K922 Gastrointestinal hemorrhage, unspecified: Secondary | ICD-10-CM | POA: Diagnosis not present

## 2016-07-24 DIAGNOSIS — Z7189 Other specified counseling: Secondary | ICD-10-CM | POA: Diagnosis not present

## 2016-07-24 DIAGNOSIS — J441 Chronic obstructive pulmonary disease with (acute) exacerbation: Secondary | ICD-10-CM | POA: Diagnosis present

## 2016-07-24 DIAGNOSIS — R0602 Shortness of breath: Secondary | ICD-10-CM | POA: Diagnosis not present

## 2016-07-24 DIAGNOSIS — I482 Chronic atrial fibrillation: Secondary | ICD-10-CM | POA: Diagnosis not present

## 2016-07-24 DIAGNOSIS — L97822 Non-pressure chronic ulcer of other part of left lower leg with fat layer exposed: Secondary | ICD-10-CM | POA: Diagnosis not present

## 2016-07-24 DIAGNOSIS — Z9181 History of falling: Secondary | ICD-10-CM | POA: Diagnosis not present

## 2016-07-24 DIAGNOSIS — R1311 Dysphagia, oral phase: Secondary | ICD-10-CM | POA: Diagnosis not present

## 2016-07-24 DIAGNOSIS — R488 Other symbolic dysfunctions: Secondary | ICD-10-CM | POA: Diagnosis not present

## 2016-07-24 DIAGNOSIS — J189 Pneumonia, unspecified organism: Secondary | ICD-10-CM | POA: Diagnosis not present

## 2016-07-24 DIAGNOSIS — N390 Urinary tract infection, site not specified: Secondary | ICD-10-CM | POA: Diagnosis not present

## 2016-07-24 DIAGNOSIS — Y95 Nosocomial condition: Secondary | ICD-10-CM | POA: Diagnosis present

## 2016-07-24 DIAGNOSIS — G9340 Encephalopathy, unspecified: Secondary | ICD-10-CM | POA: Diagnosis not present

## 2016-07-24 DIAGNOSIS — I4891 Unspecified atrial fibrillation: Secondary | ICD-10-CM | POA: Diagnosis not present

## 2016-07-24 DIAGNOSIS — L89621 Pressure ulcer of left heel, stage 1: Secondary | ICD-10-CM | POA: Diagnosis not present

## 2016-07-24 DIAGNOSIS — R627 Adult failure to thrive: Secondary | ICD-10-CM | POA: Diagnosis not present

## 2016-07-24 DIAGNOSIS — B974 Respiratory syncytial virus as the cause of diseases classified elsewhere: Secondary | ICD-10-CM | POA: Diagnosis present

## 2016-07-24 DIAGNOSIS — N179 Acute kidney failure, unspecified: Secondary | ICD-10-CM | POA: Diagnosis not present

## 2016-07-24 DIAGNOSIS — E43 Unspecified severe protein-calorie malnutrition: Secondary | ICD-10-CM | POA: Diagnosis present

## 2016-07-24 DIAGNOSIS — S82192S Other fracture of upper end of left tibia, sequela: Secondary | ICD-10-CM | POA: Diagnosis not present

## 2016-07-24 LAB — CBC WITH DIFFERENTIAL/PLATELET
Basophils Absolute: 0 10*3/uL (ref 0.0–0.1)
Basophils Relative: 0 %
EOS PCT: 1 %
Eosinophils Absolute: 0.1 10*3/uL (ref 0.0–0.7)
HCT: 29.1 % — ABNORMAL LOW (ref 39.0–52.0)
Hemoglobin: 9.6 g/dL — ABNORMAL LOW (ref 13.0–17.0)
LYMPHS ABS: 1.8 10*3/uL (ref 0.7–4.0)
LYMPHS PCT: 17 %
MCH: 31.3 pg (ref 26.0–34.0)
MCHC: 33 g/dL (ref 30.0–36.0)
MCV: 94.8 fL (ref 78.0–100.0)
MONO ABS: 0.7 10*3/uL (ref 0.1–1.0)
Monocytes Relative: 7 %
Neutro Abs: 7.8 10*3/uL — ABNORMAL HIGH (ref 1.7–7.7)
Neutrophils Relative %: 75 %
PLATELETS: 313 10*3/uL (ref 150–400)
RBC: 3.07 MIL/uL — ABNORMAL LOW (ref 4.22–5.81)
RDW: 14.6 % (ref 11.5–15.5)
WBC: 10.4 10*3/uL (ref 4.0–10.5)

## 2016-07-24 LAB — URINALYSIS, ROUTINE W REFLEX MICROSCOPIC
Bilirubin Urine: NEGATIVE
Glucose, UA: NEGATIVE mg/dL
HGB URINE DIPSTICK: NEGATIVE
KETONES UR: NEGATIVE mg/dL
NITRITE: NEGATIVE
PROTEIN: NEGATIVE mg/dL
Specific Gravity, Urine: 1.02 (ref 1.005–1.030)
Squamous Epithelial / LPF: NONE SEEN
pH: 6 (ref 5.0–8.0)

## 2016-07-24 LAB — COMPREHENSIVE METABOLIC PANEL
ALBUMIN: 3 g/dL — AB (ref 3.5–5.0)
ALT: 17 U/L (ref 17–63)
AST: 29 U/L (ref 15–41)
Alkaline Phosphatase: 201 U/L — ABNORMAL HIGH (ref 38–126)
Anion gap: 9 (ref 5–15)
BILIRUBIN TOTAL: 0.8 mg/dL (ref 0.3–1.2)
BUN: 37 mg/dL — AB (ref 6–20)
CHLORIDE: 104 mmol/L (ref 101–111)
CO2: 25 mmol/L (ref 22–32)
Calcium: 9 mg/dL (ref 8.9–10.3)
Creatinine, Ser: 0.98 mg/dL (ref 0.61–1.24)
GFR calc Af Amer: >60 mL/min (ref >60)
GFR calc non Af Amer: >60 mL/min (ref >60)
GLUCOSE: 108 mg/dL — AB (ref 65–99)
POTASSIUM: 3.9 mmol/L (ref 3.5–5.1)
SODIUM: 138 mmol/L (ref 135–145)
Total Protein: 6.5 g/dL (ref 6.5–8.1)

## 2016-07-24 LAB — I-STAT TROPONIN, ED: Troponin i, poc: 0.01 ng/mL (ref 0.00–0.08)

## 2016-07-24 MED ORDER — LEVOFLOXACIN 750 MG PO TABS
750.0000 mg | ORAL_TABLET | Freq: Once | ORAL | Status: AC
  Administered 2016-07-24: 750 mg via ORAL
  Filled 2016-07-24: qty 1

## 2016-07-24 MED ORDER — LEVOFLOXACIN 750 MG PO TABS: 750.0000 mg | ORAL_TABLET | Freq: Every day | ORAL | 0 refills | Status: DC

## 2016-07-24 MED ORDER — OXYCODONE-ACETAMINOPHEN 5-325 MG PO TABS
1.0000 | ORAL_TABLET | Freq: Once | ORAL | Status: AC
  Administered 2016-07-24: 1 via ORAL
  Filled 2016-07-24: qty 1

## 2016-07-24 MED ORDER — IOPAMIDOL (ISOVUE-370) INJECTION 76%
INTRAVENOUS | Status: AC
  Administered 2016-07-24: 85 mL
  Filled 2016-07-24: qty 100

## 2016-07-24 MED ORDER — SODIUM CHLORIDE 0.9 % IV BOLUS (SEPSIS)
500.0000 mL | Freq: Once | INTRAVENOUS | Status: AC
  Administered 2016-07-24: 500 mL via INTRAVENOUS

## 2016-07-24 NOTE — Progress Notes (Signed)
ED RN came to CM to discuss pt from [facility] and not wanting to return Discussed with RN general pt options and SW consult needed vs CM consult SW consult entered in EPIC and Text to Covering SW completed

## 2016-07-24 NOTE — ED Triage Notes (Signed)
Patient is alert and oriented.  He is from [facility] for rehab after a left tib-fib Fracture.  Patient was in rehab this morning his O2 sat dropped.  Patient also has recently been Dx with Pneumonia.

## 2016-07-24 NOTE — ED Provider Notes (Signed)
Emergency Department Provider Note   I have reviewed the triage vital signs and the nursing notes.   HISTORY  Chief Complaint Shortness of Breath   HPI Sean [name] is a 82 y.o. male with PMH of COPD, bladder cancer, HLD, HTN presents to the emergency department for evaluation of low oxygen saturation on presenting to rehabilitation today. The patient sustained a left tib-fib fracture and had intermedullary nailing as of early December. The patient denies any chest pain or difficulty breathing. He states he went to rehabilitation and they notices oxygen levels were low and referred him to the emergency department. The patient is on 2.5 L nasal cannula at home. He denies any recent fevers or chills. No dysuria.   Past Medical History:  Diagnosis Date  . ABUSE, ALCOHOL, IN REMISSION 06/17/2007  . Adenomatous colon polyp 01/1984  . Alcoholic cirrhosis of liver ([place]) 06/17/2007  . Aortic regurgitation 03/11/2016  . BLADDER CANCER 06/17/2007  . Cardiomyopathy ([place]) 03/11/2016  . CAROTID ARTERY DISEASE 07/23/2009  . COMPRESSION FRACTURE, LUMBAR VERTEBRAE 08/09/2009  . COPD 09/18/2008  . DIVERTICULOSIS, COLON 06/17/2007  . ECHOCARDIOGRAM, ABNORMAL 07/06/2008  . GERD 06/17/2007  . GLUCOSE INTOLERANCE 06/17/2007  . Hiatal hernia   . HYPERLIPIDEMIA 06/17/2007  . HYPERTENSION 06/17/2007  . Impaired glucose tolerance 12/13/2010  . Internal hemorrhoids   . LOW BACK PAIN, CHRONIC 12/17/2009  . OSTEOARTHRITIS, KNEES, BILATERAL 06/19/2009  . OSTEOPOROSIS 06/17/2007  . PEPTIC ULCER DISEASE 06/17/2007  . PSA, INCREASED 09/18/2008  . RENAL INSUFFICIENCY 09/18/2008  . Tubulovillous adenoma 04/2000   Duodenal polyp  . UNSPECIFIED SUDDEN HEARING LOSS 03/20/2009  . VITAMIN D DEFICIENCY 09/18/2008    Patient Active Problem List   Diagnosis Date Noted  . Pressure injury of skin 07/05/2016  . Lower GI bleed 07/04/2016  . History of open reduction and internal fixation (ORIF) procedure   .  Anemia of chronic disease 06/21/2016  . Chronic anticoagulation 06/21/2016  . Closed tibia fracture 06/20/2016  . Closed tibial fracture 06/20/2016  . Cardiomyopathy ([place]) 03/11/2016  . Aortic regurgitation 03/11/2016  . Atrial fibrillation ([place]) 02/27/2016  . Chest pain 08/09/2014  . Back pain 02/01/2013  . Grief reaction 02/01/2013  . Chronic pain 01/30/2012  . Bladder neck obstruction 12/25/2011  . Weight loss 10/10/2011  . Ecchymosis 06/18/2011  . Peripheral neuropathy ([place]) 12/16/2010  . Impaired glucose tolerance 12/13/2010  . Preventative health care 12/13/2010  . LOW BACK PAIN, CHRONIC 12/17/2009  . Constipation 08/09/2009  . COMPRESSION FRACTURE, LUMBAR VERTEBRAE 08/09/2009  . CAROTID ARTERY DISEASE 07/23/2009  . OSTEOARTHRITIS, KNEES, BILATERAL 06/19/2009  . UNSPECIFIED SUDDEN HEARING LOSS 03/20/2009  . VITAMIN D DEFICIENCY 09/18/2008  . COPD (chronic obstructive pulmonary disease) ([place]) 09/18/2008  . CKD (chronic kidney disease) 09/18/2008  . PSA, INCREASED 09/18/2008  . ECHOCARDIOGRAM, ABNORMAL 07/06/2008  . BRADYCARDIA 12/17/2007  . Fatigue 06/18/2007  . BLADDER CANCER 06/17/2007  . Hyperlipemia 06/17/2007  . ABUSE, ALCOHOL, IN REMISSION 06/17/2007  . Essential hypertension 06/17/2007  . GERD 06/17/2007  . PEPTIC ULCER DISEASE 06/17/2007  . DIVERTICULOSIS, COLON 06/17/2007  . Alcoholic cirrhosis of liver ([place]) 06/17/2007  . OSTEOPOROSIS 06/17/2007  . COLONIC POLYPS, HX OF 06/17/2007    Past Surgical History:  Procedure Laterality Date  . CHOLECYSTECTOMY  04/2000  . inguinal herniorrhapy    . LUMBAR LAMINECTOMY     x 2  . s/p Bilroth II    . s/p bowel obstruction surgury  12/01  . s/p left middle ear surgury  late 16's   Dr. Juanell Fairly  . TIBIA IM NAIL INSERTION Left 06/22/2016   Procedure: INTRAMEDULLARY (IM) NAIL TIBIAL;  Surgeon: Leandrew Koyanagi, MD;  Location: [facility];  Service: Orthopedics;  Laterality: Left;    Current Outpatient Rx  . Order #:  [id number] Class: Historical Med  . Order #: [id number] Class: Historical Med  . Order #: [id number] Class: Historical Med  . Order #: [id number] Class: Historical Med  . Order #: [id number] Class: Historical Med  . Order #: [id number] Class: Historical Med  . Order #: [id number] Class: Historical Med  . Order #: [id number] Class: Historical Med  . Order #: [id number] Class: Historical Med  . Order #: [id number] Class: Print  . Order #: [id number] Class: Print  . Order #: [id number] Class: No Print  . Order #: [id number] Class: Historical Med  . Order #: [id number] Class: Historical Med  . Order #: [id number] Class: Historical Med  . [START ON 07/25/2016] Order #: [id number] Class: Print  . Order #: [id number] Class: No Print    Allergies Amoxicillin-pot clavulanate and Ibuprofen  Family History  Problem Relation Age of Onset  . Diabetes Other     5 siblings  . Heart attack Mother   . Emphysema Father   . Breast cancer Sister     Social History Social History  Substance Use Topics  . Smoking status: Former Smoker    Quit date: 10/09/2004  . Smokeless tobacco: Never Used  . Alcohol use No    Review of Systems  Constitutional: No fever/chills Eyes: No visual changes. ENT: No sore throat. Cardiovascular: Denies chest pain. Respiratory: Denies shortness of breath. Gastrointestinal: No abdominal pain.  No nausea, no vomiting.  No diarrhea.  No constipation. Genitourinary: Negative for dysuria. Musculoskeletal: Negative for back pain. Skin: Negative for rash. Neurological: Negative for headaches, focal weakness or numbness.  10-point ROS otherwise negative.  ____________________________________________   PHYSICAL EXAM:  VITAL SIGNS: ED Triage Vitals  Enc Vitals Group     BP 07/24/16 0914 132/65     Pulse Rate 07/24/16 0928 88     Resp 07/24/16 0922 14     Temp 07/24/16 0928 97.4 F (36.3 C)     Temp Source 07/24/16 0928 Oral     SpO2 07/24/16 0928 100 %     Pain Score 07/24/16 0913 7    Constitutional: Alert and oriented. Well appearing and in no acute distress. Eyes: Conjunctivae are normal.  Head: Atraumatic. Nose: No congestion/rhinnorhea. Mouth/Throat: Mucous membranes are dry. Neck: No stridor.  Cardiovascular: Normal rate, regular rhythm. Good peripheral circulation. Grossly normal heart sounds.   Respiratory: Normal respiratory effort.  No retractions. Lungs CTAB. Gastrointestinal: Soft and nontender. No distention.  Musculoskeletal: LLE in CAM boot. Clean/dry dressings to left heel. No LE edema or erythema. Limited ROM of the LLE 2/2 pain. No other extremity pain or obvious injury/swelling.  Neurologic:  Normal speech and language. No gross focal neurologic deficits are appreciated.  Skin:  Skin is warm, dry and intact. Rash to the left heel.   ____________________________________________   LABS (all labs ordered are listed, but only abnormal results are displayed)  Labs Reviewed  COMPREHENSIVE METABOLIC PANEL - Abnormal; Notable for the following:       Result Value   Glucose, Bld 108 (*)    BUN 37 (*)    Albumin 3.0 (*)    Alkaline Phosphatase 201 (*)    All other components within normal limits  CBC WITH DIFFERENTIAL/PLATELET - Abnormal; Notable for the following:    RBC 3.07 (*)    Hemoglobin  9.6 (*)    HCT 29.1 (*)    Neutro Abs 7.8 (*)    All other components within normal limits  URINALYSIS, ROUTINE W REFLEX MICROSCOPIC - Abnormal; Notable for the following:    Color, Urine AMBER (*)    APPearance CLOUDY (*)    Leukocytes, UA LARGE (*)    Bacteria, UA FEW (*)    All other components within normal limits  I-STAT TROPOININ, ED   ____________________________________________  EKG   EKG Interpretation  Date/Time:  Thursday July 24 2016 10:21:15 EST Ventricular Rate:  85 PR Interval:    QRS Duration: 87 QT Interval:  380 QTC Calculation: 452 R Axis:   64 Text Interpretation:  Atrial fibrillation Anterior infarct, old No STEMI.   Confirmed by LONG MD, JOSHUA [phone]) on 07/24/2016 10:26:58 AM       ____________________________________________  RADIOLOGY  Dg Chest 2 View  Result Date: 07/24/2016 CLINICAL DATA:  Hypoxia. EXAM: CHEST  2 VIEW COMPARISON:  Radiographs of June 20, 2016. FINDINGS: Stable cardiomediastinal silhouette. Atherosclerosis of thoracic aorta is noted. No pneumothorax or pleural effusion is noted. Hyperexpansion of the lungs is noted. Old left clavicular fracture is noted. IMPRESSION: No active cardiopulmonary disease. Hyperexpansion of the lungs suggesting chronic obstructive pulmonary disease. Aortic atherosclerosis. Electronically Signed   By: Marijo Conception, M.D.   On: 07/24/2016 10:13   Ct Angio Chest Pe W And/or Wo Contrast  Result Date: 07/24/2016 CLINICAL DATA:  Hypoxemia, left rib and chest pain EXAM: CT ANGIOGRAPHY CHEST WITH CONTRAST TECHNIQUE: Multidetector CT imaging of the chest was performed using the standard protocol during bolus administration of intravenous contrast. Multiplanar CT image reconstructions and MIPs were obtained to evaluate the vascular anatomy. CONTRAST:  85 mL Isovue 370 COMPARISON:  None. FINDINGS: Cardiovascular: Satisfactory opacification of the pulmonary arteries to the segmental level. No evidence of pulmonary embolism. Normal heart size. No pericardial effusion. Abdominal aortic atherosclerosis. Mediastinum/Nodes: No enlarged mediastinal, hilar, or axillary lymph nodes. Thyroid gland, trachea, and esophagus demonstrate no significant findings. Lungs/Pleura: Bilateral centrilobular emphysema. Biapical scarring. Bilateral lower lobe airspace disease dependently which may reflect atelectasis versus pneumonia including aspiration pneumonia. No pleural effusion or pneumothorax. Upper Abdomen: No acute abnormality. Hypodense, fluid attenuating 2.9 cm left renal cyst. Musculoskeletal: No acute osseous abnormality. Old L2 vertebral body compression fracture status post  augmentation. Review of the MIP images confirms the above findings. IMPRESSION: 1. No evidence of pulmonary embolus. 2. Bilateral lower lobe airspace disease dependently which may reflect atelectasis versus pneumonia including aspiration pneumonia. 3.  Emphysema. [id number]) Electronically Signed   By: Kathreen Devoid   On: 07/24/2016 14:21    ____________________________________________   PROCEDURES  Procedure(s) performed:   Procedures  None ____________________________________________   INITIAL IMPRESSION / ASSESSMENT AND PLAN / ED COURSE  Pertinent labs & imaging results that were available during my care of the patient were reviewed by me and considered in my medical decision making (see chart for details).  Patient resents to the emergency department for evaluation of hypoxemia while presenting to rehabilitation this morning after tib-fib fracture 3 weeks prior. The patient has a brace on the left lower extremity. No appreciable swelling when visualized through the brace. Patient denies chest pain or difficult to breathing. He was noted to have hypoxemia at rehabilitation today. Normal oxygen saturation here. EKG is unremarkable. Plan for labs and chest x-ray along with reassessment. If considered DVT with resulting pulmonary embolus given his recent surgery.   02:52 PM No evidence of  pulmonary embolism. Questionable developing lower lobe pneumonia on CT. Patient has no fever, hypoxemia, tachycardia here. So has some evidence of a urinary tract infection but again no evidence of a bad systemic infection. The patient is not altered or somnolent. Case manager and social worker consultation to assist with having the patient relocated to a different skilled nursing facility. I updated family at bedside regarding the results. Plan to start Levaquin for the next 7 days to cover both possible UTI and developing pneumonia. Case manager and social worker working on moving the patient to a  different nursing facility family reports that they have heard were that there is a bed available.   03:39 PM Spoke with social work. The patient has been accepted at [facility]. They are coordinating with staff there to facilitate transfer to new facility.   Spoke with Social Work and the patient is ok for discharge to new facility. Transport called. Will discharge with Levaquin for the next week.   At this time, I do not feel there is any life-threatening condition present. I have reviewed and discussed all results (EKG, imaging, lab, urine as appropriate), exam findings with patient. I have reviewed nursing notes and appropriate previous records.  I feel the patient is safe to be discharged home without further emergent workup. Discussed usual and customary return precautions. Patient and family (if present) verbalize understanding and are comfortable with this plan.  Patient will follow-up with their primary care provider. If they do not have a primary care provider, information for follow-up has been provided to them. All questions have been answered.  ____________________________________________  FINAL CLINICAL IMPRESSION(S) / ED DIAGNOSES  Final diagnoses:  Urinary tract infection without hematuria, site unspecified     MEDICATIONS GIVEN DURING THIS VISIT:  Medications  oxyCODONE-acetaminophen (PERCOCET/ROXICET) 5-325 MG per tablet 1 tablet (1 tablet Oral Given 07/24/16 1023)  sodium chloride 0.9 % bolus 500 mL (0 mLs Intravenous Stopped 07/24/16 1320)  iopamidol (ISOVUE-370) 76 % injection (85 mLs  Contrast Given 07/24/16 1348)  levofloxacin (LEVAQUIN) tablet 750 mg (750 mg Oral Given 07/24/16 1538)  oxyCODONE-acetaminophen (PERCOCET/ROXICET) 5-325 MG per tablet 1 tablet (1 tablet Oral Given 07/24/16 1538)     NEW OUTPATIENT MEDICATIONS STARTED DURING THIS VISIT:  Discharge Medication List as of 07/24/2016  3:42 PM    START taking these medications   Details  levofloxacin  (LEVAQUIN) 750 MG tablet Take 1 tablet (750 mg total) by mouth daily., Starting Fri 07/25/2016, Until Wed 07/30/2016, Print          Note:  This document was prepared using Dragon voice recognition software and may include unintentional dictation errors.  Nanda Quinton, MD Emergency Medicine   Margette Fast, MD 07/24/16 [phone]

## 2016-07-24 NOTE — NC FL2 (Signed)
[place] LEVEL OF CARE SCREENING TOOL     IDENTIFICATION  Patient Name: Sean [name] Birthdate: 1924-12-23 Sex: male Admission Date (Current Location): 07/24/2016  [facility] and [place] Number:  Herbalist and Address:  [facility],  [place] [address], [place]      Provider Number: [id number]  Attending Physician Name and Address:  Margette Fast, MD  Relative Name and Phone Number:       Current Level of Care: Hospital Recommended Level of Care: [place] Prior Approval Number:    Date Approved/Denied:   PASRR Number: [id number]  Discharge Plan: SNF    Current Diagnoses: Patient Active Problem List   Diagnosis Date Noted  . Pressure injury of skin 07/05/2016  . Lower GI bleed 07/04/2016  . History of open reduction and internal fixation (ORIF) procedure   . Anemia of chronic disease 06/21/2016  . Chronic anticoagulation 06/21/2016  . Closed tibia fracture 06/20/2016  . Closed tibial fracture 06/20/2016  . Cardiomyopathy ([place]) 03/11/2016  . Aortic regurgitation 03/11/2016  . Atrial fibrillation ([place]) 02/27/2016  . Chest pain 08/09/2014  . Back pain 02/01/2013  . Grief reaction 02/01/2013  . Chronic pain 01/30/2012  . Bladder neck obstruction 12/25/2011  . Weight loss 10/10/2011  . Ecchymosis 06/18/2011  . Peripheral neuropathy ([place]) 12/16/2010  . Impaired glucose tolerance 12/13/2010  . Preventative health care 12/13/2010  . LOW BACK PAIN, CHRONIC 12/17/2009  . Constipation 08/09/2009  . COMPRESSION FRACTURE, LUMBAR VERTEBRAE 08/09/2009  . CAROTID ARTERY DISEASE 07/23/2009  . OSTEOARTHRITIS, KNEES, BILATERAL 06/19/2009  . UNSPECIFIED SUDDEN HEARING LOSS 03/20/2009  . VITAMIN D DEFICIENCY 09/18/2008  . COPD (chronic obstructive pulmonary disease) ([place]) 09/18/2008  . CKD (chronic kidney disease) 09/18/2008  . PSA, INCREASED 09/18/2008  . ECHOCARDIOGRAM, ABNORMAL 07/06/2008  . BRADYCARDIA  12/17/2007  . Fatigue 06/18/2007  . BLADDER CANCER 06/17/2007  . Hyperlipemia 06/17/2007  . ABUSE, ALCOHOL, IN REMISSION 06/17/2007  . Essential hypertension 06/17/2007  . GERD 06/17/2007  . PEPTIC ULCER DISEASE 06/17/2007  . DIVERTICULOSIS, COLON 06/17/2007  . Alcoholic cirrhosis of liver ([place]) 06/17/2007  . OSTEOPOROSIS 06/17/2007  . COLONIC POLYPS, HX OF 06/17/2007    Orientation RESPIRATION BLADDER Height & Weight     Self  O2 (2L) Continent Weight:   Height:     BEHAVIORAL SYMPTOMS/MOOD NEUROLOGICAL BOWEL NUTRITION STATUS      Continent Diet  AMBULATORY STATUS COMMUNICATION OF NEEDS Skin   Extensive Assist Verbally                         Personal Care Assistance Level of Assistance    Bathing Assistance: Maximum assistance   Dressing Assistance: Maximum assistance     Functional Limitations Info  Sight, Hearing, Speech Sight Info: Adequate Hearing Info: Adequate Speech Info: Adequate    SPECIAL CARE FACTORS FREQUENCY  PT (By licensed PT), OT (By licensed OT)     PT Frequency: 5 OT Frequency: 5            Contractures      Additional Factors Info  Code Status, Allergies Code Status Info: Fullcode Allergies Info: Amoxicillin-pot Clavulanate, Ibuprofen           Current Medications (07/24/2016):  This is the current hospital active medication list Current Facility-Administered Medications  Medication Dose Route Frequency Provider Last Rate Last Dose  . iopamidol (ISOVUE-370) 76 % injection  Current Outpatient Prescriptions  Medication Sig Dispense Refill  . albuterol (PROVENTIL) (5 MG/ML) 0.5% nebulizer solution Take 2.5 mg by nebulization every 6 (six) hours as needed for wheezing or shortness of breath.    . Amino Acids-Protein Hydrolys (FEEDING SUPPLEMENT, PRO-STAT SUGAR FREE 64,) LIQD Take 30 mLs by mouth 2 (two) times daily.    Marland Kitchen aspirin EC 81 MG tablet Take 81 mg by mouth daily.    . bisacodyl (DULCOLAX) 10 MG suppository  Place 10 mg rectally every three (3) days as needed for moderate constipation.    . calcium carbonate (TUMS - DOSED IN MG ELEMENTAL CALCIUM) 500 MG chewable tablet Chew 1 tablet by mouth daily.     . cholecalciferol (VITAMIN D) 1000 units tablet Take 1,000 Units by mouth daily.    Marland Kitchen doxycycline (DORYX) 100 MG EC tablet Take 100 mg by mouth 2 (two) times daily.    . mirtazapine (REMERON) 15 MG tablet Take 15 mg by mouth at bedtime.    . Multiple Vitamins-Minerals (DECUBI-VITE) CAPS Take 1 capsule by mouth daily.    Marland Kitchen omeprazole (PRILOSEC) 20 MG capsule Take 1 capsule (20 mg total) by mouth 2 (two) times daily before a meal. 180 capsule 3  . oxyCODONE (ROXICODONE) 5 MG immediate release tablet Take 1 tablet (5 mg total) by mouth every 6 (six) hours as needed. 10 tablet 0  . polyethylene glycol (MIRALAX / GLYCOLAX) packet Take 17 g by mouth daily. 14 each 0  . saccharomyces boulardii (FLORASTOR) 250 MG capsule Take 250 mg by mouth 2 (two) times daily.    Marland Kitchen senna (SENOKOT) 8.6 MG tablet Take 2 tablets by mouth at bedtime.    Marland Kitchen UNABLE TO FIND Med Name: Med pass 240 cc by mouth 2 times daily between meals    . mirtazapine (REMERON) 7.5 MG tablet Take 1 tablet (7.5 mg total) by mouth at bedtime. (Patient not taking: Reported on 07/24/2016)       Discharge Medications: Please see discharge summary for a list of discharge medications.  Relevant Imaging Results:  Relevant Lab Results:   Additional Information SS#: [id number]  Standley Brooking, LCSW

## 2016-07-24 NOTE — ED Notes (Signed)
Patient unavailable at this time. 

## 2016-07-24 NOTE — Discharge Instructions (Signed)

## 2016-07-24 NOTE — ED Notes (Signed)
Talked to social work.  Social work will talk to patient family

## 2016-07-24 NOTE — Progress Notes (Signed)
CSW confirmed patient is able to DC to [facility] today 12/28. CSW updated patient/patients family, RN, and EDP. RN has called [facility] for transportation.   Kingsley Spittle, LCSWA Clinical Social Worker [phone]

## 2016-07-25 ENCOUNTER — Non-Acute Institutional Stay (SKILLED_NURSING_FACILITY): Payer: Medicare Other | Admitting: Nurse Practitioner

## 2016-07-25 ENCOUNTER — Encounter: Payer: Self-pay | Admitting: Nurse Practitioner

## 2016-07-25 DIAGNOSIS — J189 Pneumonia, unspecified organism: Secondary | ICD-10-CM

## 2016-07-25 DIAGNOSIS — J181 Lobar pneumonia, unspecified organism: Secondary | ICD-10-CM

## 2016-07-25 DIAGNOSIS — D62 Acute posthemorrhagic anemia: Secondary | ICD-10-CM | POA: Diagnosis not present

## 2016-07-25 DIAGNOSIS — R300 Dysuria: Secondary | ICD-10-CM

## 2016-07-25 DIAGNOSIS — J449 Chronic obstructive pulmonary disease, unspecified: Secondary | ICD-10-CM

## 2016-07-25 DIAGNOSIS — R634 Abnormal weight loss: Secondary | ICD-10-CM

## 2016-07-25 DIAGNOSIS — S82192S Other fracture of upper end of left tibia, sequela: Secondary | ICD-10-CM

## 2016-07-25 MED ORDER — MIRTAZAPINE 7.5 MG PO TABS
7.5000 mg | ORAL_TABLET | Freq: Every day | ORAL | Status: AC
Start: 2016-07-25 — End: ?

## 2016-07-25 NOTE — Progress Notes (Signed)
Nursing Home Location:    [facility]  Code Status: Full Code    Place of Service: SNF (31)  PCP: Cathlean Cower, MD     Allergies  Allergen Reactions  . Amoxicillin-Pot Clavulanate     REACTION: rash  . Ibuprofen     REACTION: itching    Chief Complaint  Patient presents with  . Hospitalization Follow-up    Hospital follow-up     HPI:  Patient is a 82 y.o. male seen today at [facility] for follow up ED visit.  He has a PMH of COPD, bladder cancer, HLD, HTN. He went to the emergency department for evaluation of low oxygen saturation after being admitted to [facility] place.  The patient sustained a left tib-fib fracture and had intermedullary nailing as of early December. The patient is on 2.5 L nasal cannula at home. Pt O2 sats were normal in the ED. Work up done for PE which was negative. Possible lower lobe pneumonia on CT with abnormal urine noted on UA. Levaquin was started for 7 days to cover both UTI and Pnemonia.  Complains of dysuria today. No shortness of breath, cough or congestion Reports pain to buttocks (has bedsore) and to bilateral legs.  Eats minimally per staff, Remeron has recently been added.    Review of Systems:  Review of Systems  Constitutional: Positive for appetite change. Negative for activity change, chills, fatigue and fever.  HENT: Negative for congestion, hearing loss, rhinorrhea, sinus pressure, sneezing and sore throat.   Eyes: Negative.   Respiratory: Negative for cough, chest tightness, shortness of breath and wheezing.   Cardiovascular: Negative for chest pain, palpitations and leg swelling.  Gastrointestinal: Negative for abdominal distention, abdominal pain, constipation, diarrhea, nausea and vomiting.  Genitourinary: Positive for dysuria. Negative for difficulty urinating, flank pain, frequency and urgency.  Musculoskeletal: Positive for gait problem. Negative for arthralgias and myalgias.       Left leg pain under  control with current regimen.Full left leg brace in place.   Skin: Negative for color change, pallor, rash and wound.  Neurological: Negative for dizziness, seizures, weakness, light-headedness and headaches.  Psychiatric/Behavioral: Negative for agitation, behavioral problems, confusion and sleep disturbance. The patient is not nervous/anxious.     Past Medical History:  Diagnosis Date  . ABUSE, ALCOHOL, IN REMISSION 06/17/2007  . Adenomatous colon polyp 01/1984  . Alcoholic cirrhosis of liver ([place]) 06/17/2007  . Aortic regurgitation 03/11/2016  . BLADDER CANCER 06/17/2007  . Cardiomyopathy ([place]) 03/11/2016  . CAROTID ARTERY DISEASE 07/23/2009  . COMPRESSION FRACTURE, LUMBAR VERTEBRAE 08/09/2009  . COPD 09/18/2008  . DIVERTICULOSIS, COLON 06/17/2007  . ECHOCARDIOGRAM, ABNORMAL 07/06/2008  . GERD 06/17/2007  . GLUCOSE INTOLERANCE 06/17/2007  . Hiatal hernia   . HYPERLIPIDEMIA 06/17/2007  . HYPERTENSION 06/17/2007  . Impaired glucose tolerance 12/13/2010  . Internal hemorrhoids   . LOW BACK PAIN, CHRONIC 12/17/2009  . OSTEOARTHRITIS, KNEES, BILATERAL 06/19/2009  . OSTEOPOROSIS 06/17/2007  . PEPTIC ULCER DISEASE 06/17/2007  . PSA, INCREASED 09/18/2008  . RENAL INSUFFICIENCY 09/18/2008  . Tubulovillous adenoma 04/2000   Duodenal polyp  . UNSPECIFIED SUDDEN HEARING LOSS 03/20/2009  . VITAMIN D DEFICIENCY 09/18/2008   Past Surgical History:  Procedure Laterality Date  . CHOLECYSTECTOMY  04/2000  . inguinal herniorrhapy    . LUMBAR LAMINECTOMY     x 2  . s/p Bilroth II    . s/p bowel obstruction surgury  12/01  . s/p left middle ear surgury  late 80's  Dr. Juanell Fairly  . TIBIA IM NAIL INSERTION Left 06/22/2016   Procedure: INTRAMEDULLARY (IM) NAIL TIBIAL;  Surgeon: Leandrew Koyanagi, MD;  Location: [place];  Service: Orthopedics;  Laterality: Left;   Social History:   reports that he quit smoking about 11 years ago. He has never used smokeless tobacco. He reports that he does not drink  alcohol or use drugs.  Family History  Problem Relation Age of Onset  . Diabetes Other     5 siblings  . Heart attack Mother   . Emphysema Father   . Breast cancer Sister     Medications: Patient's Medications  New Prescriptions   No medications on file  Previous Medications   ALBUTEROL (PROVENTIL) (5 MG/ML) 0.5% NEBULIZER SOLUTION    Take 2.5 mg by nebulization every 6 (six) hours as needed for wheezing or shortness of breath.   AMINO ACIDS-PROTEIN HYDROLYS (FEEDING SUPPLEMENT, PRO-STAT SUGAR FREE 64,) LIQD    Take 30 mLs by mouth 2 (two) times daily.   ASPIRIN EC 81 MG TABLET    Take 81 mg by mouth daily.   BISACODYL (DULCOLAX) 10 MG SUPPOSITORY    Place 10 mg rectally every three (3) days as needed for moderate constipation.   CALCIUM CARBONATE (TUMS - DOSED IN MG ELEMENTAL CALCIUM) 500 MG CHEWABLE TABLET    Chew 1 tablet by mouth daily.    CHOLECALCIFEROL (VITAMIN D) 1000 UNITS TABLET    Take 1,000 Units by mouth daily.   LEVOFLOXACIN (LEVAQUIN) 750 MG TABLET    Take 1 tablet (750 mg total) by mouth daily.   LEVOFLOXACIN (LEVAQUIN) 750 MG TABLET    Take 750 mg by mouth daily.   MIRTAZAPINE (REMERON) 15 MG TABLET    Take 15 mg by mouth at bedtime.   MULTIPLE VITAMINS-MINERALS (DECUBI-VITE) CAPS    Take 1 capsule by mouth daily.   OMEPRAZOLE (PRILOSEC) 20 MG CAPSULE    Take 1 capsule (20 mg total) by mouth 2 (two) times daily before a meal.   OXYCODONE (ROXICODONE) 5 MG IMMEDIATE RELEASE TABLET    Take 1 tablet (5 mg total) by mouth every 6 (six) hours as needed.   POLYETHYLENE GLYCOL (MIRALAX / GLYCOLAX) PACKET    Take 17 g by mouth daily.   SACCHAROMYCES BOULARDII (FLORASTOR) 250 MG CAPSULE    Take 250 mg by mouth 2 (two) times daily.   SENNA (SENOKOT) 8.6 MG TABLET    Take 2 tablets by mouth at bedtime.   UNABLE TO FIND    Med Name: Med pass 240 cc by mouth 2 times daily between meals  Modified Medications   No medications on file  Discontinued Medications   DOXYCYCLINE  (DORYX) 100 MG EC TABLET    Take 100 mg by mouth 2 (two) times daily.   MIRTAZAPINE (REMERON) 7.5 MG TABLET    Take 1 tablet (7.5 mg total) by mouth at bedtime.     Physical Exam: Vitals:   07/25/16 1022  BP: (!) 117/57  Pulse: 77  Temp: 98.6 F (37 C)  TempSrc: Oral  Weight: 104 lb (47.2 kg)  Height: 5\' 10"  (1.778 m)    Physical Exam  Constitutional: He appears cachectic.  HENT:  Head: Normocephalic and atraumatic.  Mouth/Throat: Mucous membranes are dry.  Eyes: Conjunctivae and EOM are normal. Pupils are equal, round, and reactive to light.  Neck: Normal range of motion. Neck supple.  Cardiovascular: Normal rate, regular rhythm and normal heart sounds.   Pulmonary/Chest: Effort normal  and breath sounds normal.  Abdominal: Soft. Bowel sounds are normal.  Musculoskeletal: He exhibits no edema or tenderness.  Left leg immobilizer in place  Neurological: He is alert.  Skin: Skin is warm and dry.  Multiple pressure ulcers and bruising Stage 1 pressure ulcer on sacrum Stage 1 right heel Unstageable to left heel  Stage 1 to base of left great toe  Psychiatric: He has a normal mood and affect.    Labs reviewed: Basic Metabolic Panel:  Recent Labs  07/06/16 0355 07/07/16 0429 07/14/16 07/24/16 1021  NA 129* 131* 136* 138  K 4.0 4.0 4.0 3.9  CL 100* 102  --  104  CO2 20* 25  --  25  GLUCOSE 90 104*  --  108*  BUN 25* 20 21 37*  CREATININE 0.81 0.87 0.8 0.98  CALCIUM 8.0* 8.0*  --  9.0   Liver Function Tests:  Recent Labs  02/27/16 1538 06/21/16 0404 06/27/16 07/24/16 1021  AST 29 36 22 29  ALT 14 18 7* 17  ALKPHOS 144* 119 129* 201*  BILITOT 0.5 0.6  --  0.8  PROT 7.5 5.5*  --  6.5  ALBUMIN 3.9 2.8*  --  3.0*   No results for input(s): LIPASE, AMYLASE in the last 8760 hours. No results for input(s): AMMONIA in the last 8760 hours. CBC:  Recent Labs  06/20/16 2019  07/04/16 1521  07/06/16 0355  07/07/16 0429 07/14/16 07/24/16 1021  WBC 9.5  < >  10.1  < > 8.5  --  7.8 10.1 10.4  NEUTROABS 7.0  --  8.2*  --   --   --   --   --  7.8*  HGB 10.6*  < > 8.9*  < > 8.6*  < > 8.6* 9.1* 9.6*  HCT 32.5*  < > 26.1*  < > 25.6*  < > 25.4* 28* 29.1*  MCV 98.8  < > 93.9  < > 93.1  --  96.2  --  94.8  PLT 190  < > 268  < > 242  --  253 276 313  < > = values in this interval not displayed. TSH:  Recent Labs  08/24/15 1642 02/27/16 1538  TSH 1.96 2.05   A1C: Lab Results  Component Value Date   HGBA1C 5.7 08/24/2015   Lipid Panel:  Recent Labs  08/24/15 1642 02/27/16 1538  CHOL 144 151  HDL 38.40* 44.60  LDLCALC 83 85  TRIG 115.0 109.0  CHOLHDL 4 3    Radiological Exams: Dg Chest 2 View  Result Date: 07/24/2016 CLINICAL DATA:  Hypoxia. EXAM: CHEST  2 VIEW COMPARISON:  Radiographs of June 20, 2016. FINDINGS: Stable cardiomediastinal silhouette. Atherosclerosis of thoracic aorta is noted. No pneumothorax or pleural effusion is noted. Hyperexpansion of the lungs is noted. Old left clavicular fracture is noted. IMPRESSION: No active cardiopulmonary disease. Hyperexpansion of the lungs suggesting chronic obstructive pulmonary disease. Aortic atherosclerosis. Electronically Signed   By: Marijo Conception, M.D.   On: 07/24/2016 10:13   Ct Angio Chest Pe W And/or Wo Contrast  Result Date: 07/24/2016 CLINICAL DATA:  Hypoxemia, left rib and chest pain EXAM: CT ANGIOGRAPHY CHEST WITH CONTRAST TECHNIQUE: Multidetector CT imaging of the chest was performed using the standard protocol during bolus administration of intravenous contrast. Multiplanar CT image reconstructions and MIPs were obtained to evaluate the vascular anatomy. CONTRAST:  85 mL Isovue 370 COMPARISON:  None. FINDINGS: Cardiovascular: Satisfactory opacification of the pulmonary arteries to the segmental  level. No evidence of pulmonary embolism. Normal heart size. No pericardial effusion. Abdominal aortic atherosclerosis. Mediastinum/Nodes: No enlarged mediastinal, hilar, or  axillary lymph nodes. Thyroid gland, trachea, and esophagus demonstrate no significant findings. Lungs/Pleura: Bilateral centrilobular emphysema. Biapical scarring. Bilateral lower lobe airspace disease dependently which may reflect atelectasis versus pneumonia including aspiration pneumonia. No pleural effusion or pneumothorax. Upper Abdomen: No acute abnormality. Hypodense, fluid attenuating 2.9 cm left renal cyst. Musculoskeletal: No acute osseous abnormality. Old L2 vertebral body compression fracture status post augmentation. Review of the MIP images confirms the above findings. IMPRESSION: 1. No evidence of pulmonary embolus. 2. Bilateral lower lobe airspace disease dependently which may reflect atelectasis versus pneumonia including aspiration pneumonia. 3.  Emphysema. [id number]) Electronically Signed   By: Kathreen Devoid   On: 07/24/2016 14:21    Assessment/Plan 1. Anemia Post op anemia stable, hgb trending Up.   2. Chronic obstructive pulmonary disease, unspecified COPD type ([place]) Stable, without worsening shortness of breath or congestion. Remains on O2  3. Dysuria Evaluated in ED, started on Levaquin for 7 days   4. Weight loss Pt is very thin with poor appetite. RD will follow while here, cont on supplements and Remeron for appetite.   5. Other closed fracture of proximal end of left tibia, sequela status post intramedullary nailing of a left proximal tibia fracture. wheelchair bound. Pain controlled on current regimen, following with orthopedic surgery. Currently non weight bearing.  6. Pneumonia Lower lobe pneumonia noted on CT, placed on Levaquin for 7 days.   Carlos American. Harle Battiest  [facility] [phone] 8 am - 5 pm) [phone] (after hours)

## 2016-07-29 ENCOUNTER — Encounter: Payer: Self-pay | Admitting: Internal Medicine

## 2016-07-29 ENCOUNTER — Non-Acute Institutional Stay (SKILLED_NURSING_FACILITY): Payer: Medicare Other | Admitting: Internal Medicine

## 2016-07-29 DIAGNOSIS — R8290 Unspecified abnormal findings in urine: Secondary | ICD-10-CM | POA: Diagnosis not present

## 2016-07-29 DIAGNOSIS — K922 Gastrointestinal hemorrhage, unspecified: Secondary | ICD-10-CM | POA: Diagnosis not present

## 2016-07-29 DIAGNOSIS — I482 Chronic atrial fibrillation, unspecified: Secondary | ICD-10-CM

## 2016-07-29 DIAGNOSIS — R0902 Hypoxemia: Secondary | ICD-10-CM

## 2016-07-29 NOTE — Assessment & Plan Note (Signed)
Anemia improving Continue aspirin prophylactically

## 2016-07-29 NOTE — Assessment & Plan Note (Signed)
O2 sats normal

## 2016-07-29 NOTE — Progress Notes (Signed)
Facility Location: [facility]  Room Number: [id number]   Code Status:    PCP: Cathlean Cower, MD [place] [id number]   This is a comprehensive admission note to [facility] performed on this date less than 30 days from date of admission. Included are preadmission medical/surgical history;reconciled medication list; family history; social history and comprehensive review of systems.  Corrections and additions to the records were documented . Comprehensive physical exam was also performed. Additionally a clinical summary was entered for each active diagnosis pertinent to this admission in the Problem List to enhance continuity of care.   HPI: The patient was hospitalized 12/8-12/11/17 with acute lower GI bleed with bright red blood on rectal exam and hemoglobin of 8.9. Aspirin prescribed as prophylaxis in the context of atrial fibrillation was discontinued. Xarelto had also been stopped prior to that admission because of a progressively decreasing hemoglobin and history of recurrent falls in context of generalized weakness. He was admitted from [facility] with acute GI bleeding after found to have bright red blood in his brief. He had no other bleeding dyscrasias. He did have constipation requiring straining at stool. His most recent endoscopy revealed Billroth II anastomosis.Colonoscopy has revealed internal hemorrhoids, polyps, & diverticulosis. Hemoglobin had been stable over 48 hours prior to discharge & there was no evidence of any active GI bleeding. At discharge to [facility] hemoglobin was 8.6.  He returned to the emergency room 07/24/16 for evaluation of low oxygen saturation saturations at rehabilitation. Patient does employ nasal oxygen at 2.5 L at home. In the ER O2 sats were 100%. Hemoglobin was 9.6 and hematocrit 29.1, representing progressive improvement in the anemia.Marland Kitchen He was discharged from the ER to this facility  for PT/OT. Urinalysis revealed large leukocytes and white blood cells too numerous to count. No urine culture is documented in Epic.  Past medical and surgical history is long & complicated. He has hypertension, dyslipidemia, and glucose intolerance. Diverticulosis , tubovillous adenoma and adenomatous polyp have been found @ colonoscopy. Osteoporosis has been complicated by compression fracture of the lumbar vertebrae. He also has history of bladder cancer. He has had surgery for bowel obstruction and cholecystectomy. He's also had lumbar laminectomy.  Social history:He is a former smoker and former alcohol user.  Family history:Reviewed  Review of systems:Could not be validated due to dementia. Date given as10/08/1933. This was repeated twice. He did know the president's name.  He continues to have pain in the left lower extremity. He denied any bleeding dyscrasias . He did state that he had some exertional dyspnea. He also described chronic heartburn and rare dysphagia. He's had some dysuria.  Constitutional: No fever,significant weight change, fatigue  Eyes: No redness, discharge, pain, vision change ENT/mouth: No nasal congestion,  purulent discharge, earache,change in hearing ,sore throat  Cardiovascular: No chest pain, palpitations,paroxysmal nocturnal dyspnea, claudication, edema  Respiratory: No cough, sputum production,hemoptysis,   Gastrointestinal: No abdominal pain, nausea / vomiting,rectal bleeding, melena,change in bowels Genitourinary: No hematuria, pyuria,  incontinence, nocturia Dermatologic: No rash, pruritus, change in appearance of skin Neurologic: No dizziness,headache,syncope, seizures, numbness , tingling Psychiatric: No significant anxiety , depression, insomnia, anorexia Endocrine: No change in hair/skin/ nails, excessive thirst, excessive hunger, excessive urination  Hematologic/lymphatic: No significant bruising, lymphadenopathy,abnormal bleeding   Physical  exam:  Pertinent or positive findings:He is grossly cachectic with temporal muscle wasting , limb muscle wasting & interosseous muscle wasting.  There is decreased hearing. Dentures  present. Heart sounds are distant and irregular and heard best in the epigastrium.   Breath sounds are also decreased. The aorta is very firm but not enlarged clinically.  There is a soft cast over the left lower extremity. He has severe deformities of the toenails.   The pedal pulses are not palpable. Reflexes are 0-1/2+. He is generally profoundly weak.  General appearance: no acute distress , increased work of breathing is present.   Lymphatic: No lymphadenopathy about the head, neck, axilla . Eyes: No conjunctival inflammation or lid edema is present. There is no scleral icterus. Ears:  External ear exam shows no significant lesions or deformities.   Nose:  External nasal examination shows no deformity or inflammation. Nasal mucosa are pink and moist without lesions ,exudates Oral exam: lips and gums are healthy appearing.There is no oropharyngeal erythema or exudate . Neck:  No thyromegaly, masses, tenderness noted.    Heart:  No gallop, murmur, click, rub .  Lungs:Chest clear to auscultation without wheezes, rhonchi,rales , rubs. Abdomen:Bowel sounds are normal. Abdomen is soft and nontender with no organomegaly, hernias. GU: deferred as previously addressed. Extremities:  No cyanosis, clubbing,edema  Neurologic exam : Balance,Rhomberg,finger to nose testing could not be completed due to clinical state Deep tendon reflexes are equal Skin: Warm & dry w/o tenting. No significant lesions or rash.  See clinical summary under each active problem in the Problem List with associated updated therapeutic plan

## 2016-07-29 NOTE — Assessment & Plan Note (Signed)
Xarelto discontinued because of recurrent falls in context of progressively decreasing hemoglobin

## 2016-07-29 NOTE — Patient Instructions (Signed)
See Current Assessment & Plan in Problem List under specific Diagnosis 

## 2016-07-30 ENCOUNTER — Ambulatory Visit: Payer: Medicare Other | Admitting: Physician Assistant

## 2016-07-31 DIAGNOSIS — L89520 Pressure ulcer of left ankle, unstageable: Secondary | ICD-10-CM | POA: Diagnosis not present

## 2016-07-31 DIAGNOSIS — L89621 Pressure ulcer of left heel, stage 1: Secondary | ICD-10-CM | POA: Diagnosis not present

## 2016-07-31 DIAGNOSIS — L97522 Non-pressure chronic ulcer of other part of left foot with fat layer exposed: Secondary | ICD-10-CM | POA: Diagnosis not present

## 2016-07-31 DIAGNOSIS — L89151 Pressure ulcer of sacral region, stage 1: Secondary | ICD-10-CM | POA: Diagnosis not present

## 2016-08-01 ENCOUNTER — Telehealth (INDEPENDENT_AMBULATORY_CARE_PROVIDER_SITE_OTHER): Payer: Self-pay | Admitting: Orthopaedic Surgery

## 2016-08-01 NOTE — Telephone Encounter (Signed)
Patient has ROV  Jan 25th, 2018.  Right now patient is non weight bearing.  Is this to continue until 08-21-16.  Please advise ASAP

## 2016-08-01 NOTE — Telephone Encounter (Signed)
LMOM NWM UNTIL NEXT OFFICE FOLLOW UP

## 2016-08-05 ENCOUNTER — Telehealth (INDEPENDENT_AMBULATORY_CARE_PROVIDER_SITE_OTHER): Payer: Self-pay | Admitting: Orthopaedic Surgery

## 2016-08-05 NOTE — Telephone Encounter (Signed)
no

## 2016-08-05 NOTE — Telephone Encounter (Signed)
Please advise on message. 

## 2016-08-06 NOTE — Telephone Encounter (Signed)
Called to advise on message.  

## 2016-08-07 DIAGNOSIS — L97522 Non-pressure chronic ulcer of other part of left foot with fat layer exposed: Secondary | ICD-10-CM | POA: Diagnosis not present

## 2016-08-07 DIAGNOSIS — L97822 Non-pressure chronic ulcer of other part of left lower leg with fat layer exposed: Secondary | ICD-10-CM | POA: Diagnosis not present

## 2016-08-07 DIAGNOSIS — L89151 Pressure ulcer of sacral region, stage 1: Secondary | ICD-10-CM | POA: Diagnosis not present

## 2016-08-07 DIAGNOSIS — L89520 Pressure ulcer of left ankle, unstageable: Secondary | ICD-10-CM | POA: Diagnosis not present

## 2016-08-07 DIAGNOSIS — L89621 Pressure ulcer of left heel, stage 1: Secondary | ICD-10-CM | POA: Diagnosis not present

## 2016-08-08 ENCOUNTER — Emergency Department (HOSPITAL_COMMUNITY): Payer: Medicare Other

## 2016-08-08 ENCOUNTER — Telehealth (INDEPENDENT_AMBULATORY_CARE_PROVIDER_SITE_OTHER): Payer: Self-pay | Admitting: Orthopaedic Surgery

## 2016-08-08 ENCOUNTER — Inpatient Hospital Stay (HOSPITAL_COMMUNITY)
Admission: EM | Admit: 2016-08-08 | Discharge: 2016-08-28 | DRG: 871 | Disposition: E | Payer: Medicare Other | Attending: Internal Medicine | Admitting: Internal Medicine

## 2016-08-08 ENCOUNTER — Encounter (HOSPITAL_COMMUNITY): Payer: Self-pay

## 2016-08-08 DIAGNOSIS — J189 Pneumonia, unspecified organism: Secondary | ICD-10-CM | POA: Diagnosis not present

## 2016-08-08 DIAGNOSIS — E872 Acidosis: Secondary | ICD-10-CM | POA: Diagnosis present

## 2016-08-08 DIAGNOSIS — I429 Cardiomyopathy, unspecified: Secondary | ICD-10-CM | POA: Diagnosis present

## 2016-08-08 DIAGNOSIS — J441 Chronic obstructive pulmonary disease with (acute) exacerbation: Secondary | ICD-10-CM | POA: Diagnosis present

## 2016-08-08 DIAGNOSIS — Z9181 History of falling: Secondary | ICD-10-CM

## 2016-08-08 DIAGNOSIS — K219 Gastro-esophageal reflux disease without esophagitis: Secondary | ICD-10-CM | POA: Diagnosis present

## 2016-08-08 DIAGNOSIS — A419 Sepsis, unspecified organism: Secondary | ICD-10-CM | POA: Diagnosis not present

## 2016-08-08 DIAGNOSIS — F1011 Alcohol abuse, in remission: Secondary | ICD-10-CM | POA: Diagnosis present

## 2016-08-08 DIAGNOSIS — G9341 Metabolic encephalopathy: Secondary | ICD-10-CM | POA: Diagnosis present

## 2016-08-08 DIAGNOSIS — B974 Respiratory syncytial virus as the cause of diseases classified elsewhere: Secondary | ICD-10-CM | POA: Diagnosis present

## 2016-08-08 DIAGNOSIS — Z7189 Other specified counseling: Secondary | ICD-10-CM

## 2016-08-08 DIAGNOSIS — Z7982 Long term (current) use of aspirin: Secondary | ICD-10-CM

## 2016-08-08 DIAGNOSIS — G8929 Other chronic pain: Secondary | ICD-10-CM | POA: Diagnosis present

## 2016-08-08 DIAGNOSIS — Z87891 Personal history of nicotine dependence: Secondary | ICD-10-CM | POA: Diagnosis not present

## 2016-08-08 DIAGNOSIS — M810 Age-related osteoporosis without current pathological fracture: Secondary | ICD-10-CM | POA: Diagnosis present

## 2016-08-08 DIAGNOSIS — J9621 Acute and chronic respiratory failure with hypoxia: Secondary | ICD-10-CM | POA: Diagnosis present

## 2016-08-08 DIAGNOSIS — Z833 Family history of diabetes mellitus: Secondary | ICD-10-CM | POA: Diagnosis not present

## 2016-08-08 DIAGNOSIS — I4891 Unspecified atrial fibrillation: Secondary | ICD-10-CM | POA: Diagnosis not present

## 2016-08-08 DIAGNOSIS — Z8711 Personal history of peptic ulcer disease: Secondary | ICD-10-CM | POA: Diagnosis not present

## 2016-08-08 DIAGNOSIS — I482 Chronic atrial fibrillation: Secondary | ICD-10-CM

## 2016-08-08 DIAGNOSIS — Z681 Body mass index (BMI) 19 or less, adult: Secondary | ICD-10-CM | POA: Diagnosis not present

## 2016-08-08 DIAGNOSIS — I480 Paroxysmal atrial fibrillation: Secondary | ICD-10-CM | POA: Diagnosis present

## 2016-08-08 DIAGNOSIS — Y95 Nosocomial condition: Secondary | ICD-10-CM | POA: Diagnosis present

## 2016-08-08 DIAGNOSIS — L89159 Pressure ulcer of sacral region, unspecified stage: Secondary | ICD-10-CM | POA: Diagnosis present

## 2016-08-08 DIAGNOSIS — R6521 Severe sepsis with septic shock: Secondary | ICD-10-CM | POA: Diagnosis present

## 2016-08-08 DIAGNOSIS — N179 Acute kidney failure, unspecified: Secondary | ICD-10-CM | POA: Diagnosis not present

## 2016-08-08 DIAGNOSIS — K7030 Alcoholic cirrhosis of liver without ascites: Secondary | ICD-10-CM | POA: Diagnosis present

## 2016-08-08 DIAGNOSIS — R627 Adult failure to thrive: Secondary | ICD-10-CM | POA: Diagnosis present

## 2016-08-08 DIAGNOSIS — D638 Anemia in other chronic diseases classified elsewhere: Secondary | ICD-10-CM | POA: Diagnosis present

## 2016-08-08 DIAGNOSIS — I10 Essential (primary) hypertension: Secondary | ICD-10-CM | POA: Diagnosis present

## 2016-08-08 DIAGNOSIS — Z515 Encounter for palliative care: Secondary | ICD-10-CM

## 2016-08-08 DIAGNOSIS — Z803 Family history of malignant neoplasm of breast: Secondary | ICD-10-CM

## 2016-08-08 DIAGNOSIS — J440 Chronic obstructive pulmonary disease with acute lower respiratory infection: Secondary | ICD-10-CM | POA: Diagnosis present

## 2016-08-08 DIAGNOSIS — L89899 Pressure ulcer of other site, unspecified stage: Secondary | ICD-10-CM | POA: Diagnosis present

## 2016-08-08 DIAGNOSIS — E43 Unspecified severe protein-calorie malnutrition: Secondary | ICD-10-CM | POA: Diagnosis present

## 2016-08-08 DIAGNOSIS — E785 Hyperlipidemia, unspecified: Secondary | ICD-10-CM | POA: Diagnosis present

## 2016-08-08 DIAGNOSIS — N170 Acute kidney failure with tubular necrosis: Secondary | ICD-10-CM | POA: Diagnosis present

## 2016-08-08 DIAGNOSIS — M545 Low back pain: Secondary | ICD-10-CM | POA: Diagnosis present

## 2016-08-08 DIAGNOSIS — Z8249 Family history of ischemic heart disease and other diseases of the circulatory system: Secondary | ICD-10-CM

## 2016-08-08 DIAGNOSIS — G9340 Encephalopathy, unspecified: Secondary | ICD-10-CM | POA: Diagnosis present

## 2016-08-08 DIAGNOSIS — Z8551 Personal history of malignant neoplasm of bladder: Secondary | ICD-10-CM

## 2016-08-08 DIAGNOSIS — F419 Anxiety disorder, unspecified: Secondary | ICD-10-CM | POA: Diagnosis present

## 2016-08-08 DIAGNOSIS — J449 Chronic obstructive pulmonary disease, unspecified: Secondary | ICD-10-CM | POA: Diagnosis present

## 2016-08-08 DIAGNOSIS — Z66 Do not resuscitate: Secondary | ICD-10-CM | POA: Diagnosis present

## 2016-08-08 DIAGNOSIS — R0602 Shortness of breath: Secondary | ICD-10-CM | POA: Diagnosis not present

## 2016-08-08 DIAGNOSIS — Z825 Family history of asthma and other chronic lower respiratory diseases: Secondary | ICD-10-CM

## 2016-08-08 DIAGNOSIS — Z9981 Dependence on supplemental oxygen: Secondary | ICD-10-CM | POA: Diagnosis not present

## 2016-08-08 LAB — COMPREHENSIVE METABOLIC PANEL
ALBUMIN: 2.3 g/dL — AB (ref 3.5–5.0)
ALK PHOS: 176 U/L — AB (ref 38–126)
ALT: 19 U/L (ref 17–63)
AST: 50 U/L — AB (ref 15–41)
Anion gap: 14 (ref 5–15)
BILIRUBIN TOTAL: 0.9 mg/dL (ref 0.3–1.2)
BUN: 43 mg/dL — AB (ref 6–20)
CO2: 21 mmol/L — ABNORMAL LOW (ref 22–32)
CREATININE: 1.41 mg/dL — AB (ref 0.61–1.24)
Calcium: 9.2 mg/dL (ref 8.9–10.3)
Chloride: 100 mmol/L — ABNORMAL LOW (ref 101–111)
GFR calc Af Amer: 49 mL/min — ABNORMAL LOW (ref >60)
GFR calc non Af Amer: 43 mL/min — ABNORMAL LOW (ref >60)
GLUCOSE: 137 mg/dL — AB (ref 65–99)
POTASSIUM: 3.6 mmol/L (ref 3.5–5.1)
Sodium: 135 mmol/L (ref 135–145)
TOTAL PROTEIN: 6 g/dL — AB (ref 6.5–8.1)

## 2016-08-08 LAB — URINALYSIS, ROUTINE W REFLEX MICROSCOPIC
Bilirubin Urine: NEGATIVE
Glucose, UA: NEGATIVE mg/dL
Hgb urine dipstick: NEGATIVE
Ketones, ur: NEGATIVE mg/dL
Nitrite: NEGATIVE
PROTEIN: NEGATIVE mg/dL
Specific Gravity, Urine: 1.019 (ref 1.005–1.030)
pH: 5 (ref 5.0–8.0)

## 2016-08-08 LAB — CBC WITH DIFFERENTIAL/PLATELET
BASOS ABS: 0 10*3/uL (ref 0.0–0.1)
Basophils Relative: 0 %
Eosinophils Absolute: 0 10*3/uL (ref 0.0–0.7)
Eosinophils Relative: 0 %
HEMATOCRIT: 32.8 % — AB (ref 39.0–52.0)
HEMOGLOBIN: 10.9 g/dL — AB (ref 13.0–17.0)
LYMPHS ABS: 0.4 10*3/uL — AB (ref 0.7–4.0)
Lymphocytes Relative: 14 %
MCH: 31.5 pg (ref 26.0–34.0)
MCHC: 33.2 g/dL (ref 30.0–36.0)
MCV: 94.8 fL (ref 78.0–100.0)
MONO ABS: 0.1 10*3/uL (ref 0.1–1.0)
MONOS PCT: 2 %
NEUTROS ABS: 2.6 10*3/uL (ref 1.7–7.7)
Neutrophils Relative %: 84 %
Platelets: 249 10*3/uL (ref 150–400)
RBC: 3.46 MIL/uL — ABNORMAL LOW (ref 4.22–5.81)
RDW: 16.6 % — ABNORMAL HIGH (ref 11.5–15.5)
WBC: 3.1 10*3/uL — ABNORMAL LOW (ref 4.0–10.5)

## 2016-08-08 LAB — PROCALCITONIN: PROCALCITONIN: 7.42 ng/mL

## 2016-08-08 LAB — RESPIRATORY PANEL BY PCR
ADENOVIRUS-RVPPCR: NOT DETECTED
Bordetella pertussis: NOT DETECTED
CHLAMYDOPHILA PNEUMONIAE-RVPPCR: NOT DETECTED
CORONAVIRUS HKU1-RVPPCR: NOT DETECTED
CORONAVIRUS NL63-RVPPCR: NOT DETECTED
Coronavirus 229E: NOT DETECTED
Coronavirus OC43: NOT DETECTED
Influenza A: NOT DETECTED
Influenza B: NOT DETECTED
MYCOPLASMA PNEUMONIAE-RVPPCR: NOT DETECTED
Metapneumovirus: NOT DETECTED
PARAINFLUENZA VIRUS 1-RVPPCR: NOT DETECTED
Parainfluenza Virus 2: NOT DETECTED
Parainfluenza Virus 3: NOT DETECTED
Parainfluenza Virus 4: NOT DETECTED
Respiratory Syncytial Virus: DETECTED — AB
Rhinovirus / Enterovirus: NOT DETECTED

## 2016-08-08 LAB — PROTIME-INR
INR: 1.47
Prothrombin Time: 17.9 seconds — ABNORMAL HIGH (ref 11.4–15.2)

## 2016-08-08 LAB — APTT: APTT: 39 s — AB (ref 24–36)

## 2016-08-08 LAB — LACTIC ACID, PLASMA
LACTIC ACID, VENOUS: 5.6 mmol/L — AB (ref 0.5–1.9)
Lactic Acid, Venous: 5.5 mmol/L (ref 0.5–1.9)

## 2016-08-08 LAB — MRSA PCR SCREENING: MRSA by PCR: NEGATIVE

## 2016-08-08 LAB — I-STAT CG4 LACTIC ACID, ED: LACTIC ACID, VENOUS: 6.49 mmol/L — AB (ref 0.5–1.9)

## 2016-08-08 LAB — INFLUENZA PANEL BY PCR (TYPE A & B)
INFLBPCR: NEGATIVE
Influenza A By PCR: NEGATIVE

## 2016-08-08 MED ORDER — IPRATROPIUM BROMIDE 0.02 % IN SOLN
0.5000 mg | RESPIRATORY_TRACT | Status: DC
  Administered 2016-08-08: 0.5 mg via RESPIRATORY_TRACT
  Filled 2016-08-08: qty 2.5

## 2016-08-08 MED ORDER — LEVALBUTEROL HCL 1.25 MG/0.5ML IN NEBU: 1.2500 mg | INHALATION_SOLUTION | Freq: Four times a day (QID) | RESPIRATORY_TRACT | Status: DC

## 2016-08-08 MED ORDER — ADULT MULTIVITAMIN W/MINERALS CH: 1.0000 | ORAL_TABLET | Freq: Every day | ORAL | Status: DC

## 2016-08-08 MED ORDER — MIRTAZAPINE 7.5 MG PO TABS
7.5000 mg | ORAL_TABLET | Freq: Every day | ORAL | Status: DC
  Filled 2016-08-08: qty 1

## 2016-08-08 MED ORDER — LORAZEPAM 2 MG/ML IJ SOLN
0.5000 mg | Freq: Two times a day (BID) | INTRAMUSCULAR | Status: DC
  Administered 2016-08-09 – 2016-08-11 (×3): 0.5 mg via INTRAVENOUS
  Filled 2016-08-08 (×3): qty 1

## 2016-08-08 MED ORDER — LEVALBUTEROL HCL 1.25 MG/0.5ML IN NEBU
1.2500 mg | INHALATION_SOLUTION | Freq: Four times a day (QID) | RESPIRATORY_TRACT | Status: DC
  Administered 2016-08-08: 1.25 mg via RESPIRATORY_TRACT
  Filled 2016-08-08 (×3): qty 0.5

## 2016-08-08 MED ORDER — MORPHINE SULFATE (PF) 2 MG/ML IV SOLN
1.0000 mg | INTRAVENOUS | Status: DC | PRN
  Administered 2016-08-08 – 2016-08-09 (×4): 2 mg via INTRAVENOUS
  Administered 2016-08-09 – 2016-08-11 (×4): 4 mg via INTRAVENOUS
  Filled 2016-08-08 (×2): qty 2
  Filled 2016-08-08: qty 1
  Filled 2016-08-08 (×2): qty 2
  Filled 2016-08-08 (×2): qty 1
  Filled 2016-08-08: qty 2
  Filled 2016-08-08: qty 1

## 2016-08-08 MED ORDER — MAGNESIUM HYDROXIDE 400 MG/5ML PO SUSP: 30.0000 mL | Freq: Every day | ORAL | Status: DC | PRN

## 2016-08-08 MED ORDER — SODIUM CHLORIDE 0.9 % IV BOLUS (SEPSIS)
1000.0000 mL | Freq: Once | INTRAVENOUS | Status: AC
  Administered 2016-08-08: 1000 mL via INTRAVENOUS

## 2016-08-08 MED ORDER — ONDANSETRON HCL 4 MG/2ML IJ SOLN: 4.0000 mg | Freq: Three times a day (TID) | INTRAMUSCULAR | Status: DC | PRN

## 2016-08-08 MED ORDER — SENNA 8.6 MG PO TABS: 1.0000 | ORAL_TABLET | Freq: Every day | ORAL | Status: DC

## 2016-08-08 MED ORDER — DEXTROSE 5 % IV SOLN
1.0000 g | Freq: Three times a day (TID) | INTRAVENOUS | Status: DC
  Filled 2016-08-08: qty 1

## 2016-08-08 MED ORDER — VANCOMYCIN HCL 500 MG IV SOLR: 500.0000 mg | INTRAVENOUS | Status: DC

## 2016-08-08 MED ORDER — SACCHAROMYCES BOULARDII 250 MG PO CAPS: 250.0000 mg | ORAL_CAPSULE | Freq: Two times a day (BID) | ORAL | Status: DC

## 2016-08-08 MED ORDER — IPRATROPIUM BROMIDE 0.02 % IN SOLN: 0.5000 mg | Freq: Four times a day (QID) | RESPIRATORY_TRACT | Status: DC | PRN

## 2016-08-08 MED ORDER — SODIUM CHLORIDE 0.9 % IV BOLUS (SEPSIS): 500.0000 mL | Freq: Once | INTRAVENOUS | Status: DC

## 2016-08-08 MED ORDER — ACETAMINOPHEN 325 MG PO TABS: 650.0000 mg | ORAL_TABLET | Freq: Four times a day (QID) | ORAL | Status: DC | PRN

## 2016-08-08 MED ORDER — CALCIUM CARBONATE ANTACID 500 MG PO CHEW: 1.0000 | CHEWABLE_TABLET | Freq: Every day | ORAL | Status: DC

## 2016-08-08 MED ORDER — DEXTROSE 5 % IV SOLN
2.0000 g | Freq: Once | INTRAVENOUS | Status: AC
  Administered 2016-08-08: 2 g via INTRAVENOUS
  Filled 2016-08-08: qty 2

## 2016-08-08 MED ORDER — DECUBI-VITE PO CAPS: 1.0000 | ORAL_CAPSULE | Freq: Every day | ORAL | Status: DC

## 2016-08-08 MED ORDER — LEVOFLOXACIN IN D5W 750 MG/150ML IV SOLN: 750.0000 mg | INTRAVENOUS | Status: DC

## 2016-08-08 MED ORDER — VITAMIN D 1000 UNITS PO TABS: 1000.0000 [IU] | ORAL_TABLET | Freq: Every day | ORAL | Status: DC

## 2016-08-08 MED ORDER — LEVOFLOXACIN IN D5W 750 MG/150ML IV SOLN
750.0000 mg | Freq: Once | INTRAVENOUS | Status: AC
  Administered 2016-08-08: 750 mg via INTRAVENOUS
  Filled 2016-08-08: qty 150

## 2016-08-08 MED ORDER — PANTOPRAZOLE SODIUM 40 MG PO TBEC: 40.0000 mg | DELAYED_RELEASE_TABLET | Freq: Every day | ORAL | Status: DC

## 2016-08-08 MED ORDER — GLYCOPYRROLATE 0.2 MG/ML IJ SOLN: 0.4000 mg | Freq: Four times a day (QID) | INTRAMUSCULAR | Status: DC | PRN

## 2016-08-08 MED ORDER — VANCOMYCIN HCL IN DEXTROSE 1-5 GM/200ML-% IV SOLN
1000.0000 mg | Freq: Once | INTRAVENOUS | Status: AC
  Administered 2016-08-08: 1000 mg via INTRAVENOUS
  Filled 2016-08-08: qty 200

## 2016-08-08 MED ORDER — BISACODYL 10 MG RE SUPP: 10.0000 mg | RECTAL | Status: DC | PRN

## 2016-08-08 MED ORDER — MORPHINE SULFATE (PF) 2 MG/ML IV SOLN
1.0000 mg | INTRAVENOUS | Status: DC | PRN
  Administered 2016-08-08: 1 mg via INTRAVENOUS
  Filled 2016-08-08: qty 1

## 2016-08-08 MED ORDER — LEVALBUTEROL HCL 1.25 MG/0.5ML IN NEBU: 1.2500 mg | INHALATION_SOLUTION | Freq: Four times a day (QID) | RESPIRATORY_TRACT | Status: DC | PRN

## 2016-08-08 MED ORDER — ENOXAPARIN SODIUM 30 MG/0.3ML ~~LOC~~ SOLN: 30.0000 mg | SUBCUTANEOUS | Status: DC

## 2016-08-08 MED ORDER — POLYETHYLENE GLYCOL 3350 17 G PO PACK: 17.0000 g | PACK | Freq: Every day | ORAL | Status: DC | PRN

## 2016-08-08 MED ORDER — ASPIRIN 81 MG PO CHEW: 81.0000 mg | CHEWABLE_TABLET | Freq: Every day | ORAL | Status: DC

## 2016-08-08 MED ORDER — PRO-STAT SUGAR FREE PO LIQD: 30.0000 mL | Freq: Two times a day (BID) | ORAL | Status: DC

## 2016-08-08 MED ORDER — IPRATROPIUM BROMIDE 0.02 % IN SOLN
0.5000 mg | Freq: Four times a day (QID) | RESPIRATORY_TRACT | Status: DC
  Administered 2016-08-08: 0.5 mg via RESPIRATORY_TRACT
  Filled 2016-08-08 (×3): qty 2.5

## 2016-08-08 MED ORDER — LORAZEPAM 2 MG/ML IJ SOLN
1.0000 mg | INTRAMUSCULAR | Status: DC | PRN
  Administered 2016-08-08 – 2016-08-10 (×3): 1 mg via INTRAVENOUS
  Filled 2016-08-08 (×3): qty 1

## 2016-08-08 MED ORDER — ENSURE ENLIVE PO LIQD: 237.0000 mL | Freq: Two times a day (BID) | ORAL | Status: DC

## 2016-08-08 MED ORDER — OXYCODONE HCL 5 MG PO TABS: 5.0000 mg | ORAL_TABLET | Freq: Four times a day (QID) | ORAL | Status: DC | PRN

## 2016-08-08 MED ORDER — SODIUM CHLORIDE 0.9 % IV SOLN
INTRAVENOUS | Status: DC
  Administered 2016-08-08: 04:00:00 via INTRAVENOUS

## 2016-08-08 MED ORDER — DM-GUAIFENESIN ER 30-600 MG PO TB12: 1.0000 | ORAL_TABLET | Freq: Two times a day (BID) | ORAL | Status: DC

## 2016-08-08 NOTE — ED Provider Notes (Signed)
[facility] DEPT Provider Note   CSN: [id number] Arrival date & time: 08/22/2016  0059  By signing my name below, I, Sean [name], attest that this documentation has been prepared under the direction and in the presence of Sean Balls, MD . Electronically Signed: Evelene [name], Scribe. 08/21/2016. 1:22 AM.  History   Chief Complaint Chief Complaint  Patient presents with  . Shortness of Breath   LEVEL 5 CAVEAT DUE TO ACUITY OF MEDICAL CONDITION    HPI Sean [name] is a 83 y.o. male with history of COPD, bladder cancer, HTN, and HLD presents to the ED with altered mental status and hypoxia as a transfer from his rehab facility. History is provided by the son at interview. He states that the patient is currently residing in a rehab facility after sustaining a L tib-fib fracture in early December 2017. For the last 2 days the patient has been more confused than his baseline. Today, nursing staff found the patient in his bed having removed his nasal cannula and hypoxic to 77 percent. He was febrile to 101.75F temporal with medic. The son has no other concerns at this time.   The history is provided by the nursing home, the EMS personnel and a relative. No language interpreter was used.    Past Medical History:  Diagnosis Date  . ABUSE, ALCOHOL, IN REMISSION 06/17/2007  . Adenomatous colon polyp 01/1984  . Alcoholic cirrhosis of liver ([place]) 06/17/2007  . Aortic regurgitation 03/11/2016  . BLADDER CANCER 06/17/2007  . Cardiomyopathy ([place]) 03/11/2016  . CAROTID ARTERY DISEASE 07/23/2009  . COMPRESSION FRACTURE, LUMBAR VERTEBRAE 08/09/2009  . COPD 09/18/2008  . DIVERTICULOSIS, COLON 06/17/2007  . ECHOCARDIOGRAM, ABNORMAL 07/06/2008  . GERD 06/17/2007  . GLUCOSE INTOLERANCE 06/17/2007  . Hiatal hernia   . HYPERLIPIDEMIA 06/17/2007  . HYPERTENSION 06/17/2007  . Impaired glucose tolerance 12/13/2010  . Internal hemorrhoids   . LOW BACK PAIN, CHRONIC 12/17/2009  . OSTEOARTHRITIS, KNEES,  BILATERAL 06/19/2009  . OSTEOPOROSIS 06/17/2007  . PEPTIC ULCER DISEASE 06/17/2007  . PSA, INCREASED 09/18/2008  . RENAL INSUFFICIENCY 09/18/2008  . Tubulovillous adenoma 04/2000   Duodenal polyp  . UNSPECIFIED SUDDEN HEARING LOSS 03/20/2009  . VITAMIN D DEFICIENCY 09/18/2008    Patient Active Problem List   Diagnosis Date Noted  . Hypoxemia 07/29/2016  . Pressure injury of skin 07/05/2016  . Lower GI bleed 07/04/2016  . History of open reduction and internal fixation (ORIF) procedure   . Anemia of chronic disease 06/21/2016  . Chronic anticoagulation 06/21/2016  . Closed tibia fracture 06/20/2016  . Closed tibial fracture 06/20/2016  . Cardiomyopathy ([place]) 03/11/2016  . Aortic regurgitation 03/11/2016  . Atrial fibrillation ([place]) 02/27/2016  . Chest pain 08/09/2014  . Back pain 02/01/2013  . Grief reaction 02/01/2013  . Chronic pain 01/30/2012  . Bladder neck obstruction 12/25/2011  . Weight loss 10/10/2011  . Ecchymosis 06/18/2011  . Peripheral neuropathy ([place]) 12/16/2010  . Impaired glucose tolerance 12/13/2010  . Preventative health care 12/13/2010  . LOW BACK PAIN, CHRONIC 12/17/2009  . Constipation 08/09/2009  . COMPRESSION FRACTURE, LUMBAR VERTEBRAE 08/09/2009  . CAROTID ARTERY DISEASE 07/23/2009  . OSTEOARTHRITIS, KNEES, BILATERAL 06/19/2009  . UNSPECIFIED SUDDEN HEARING LOSS 03/20/2009  . VITAMIN D DEFICIENCY 09/18/2008  . COPD (chronic obstructive pulmonary disease) ([place]) 09/18/2008  . CKD (chronic kidney disease) 09/18/2008  . PSA, INCREASED 09/18/2008  . ECHOCARDIOGRAM, ABNORMAL 07/06/2008  . BRADYCARDIA 12/17/2007  . Fatigue 06/18/2007  . BLADDER CANCER 06/17/2007  . Hyperlipemia 06/17/2007  .  ABUSE, ALCOHOL, IN REMISSION 06/17/2007  . Essential hypertension 06/17/2007  . GERD 06/17/2007  . PEPTIC ULCER DISEASE 06/17/2007  . DIVERTICULOSIS, COLON 06/17/2007  . Alcoholic cirrhosis of liver ([place]) 06/17/2007  . OSTEOPOROSIS 06/17/2007  . COLONIC POLYPS,  HX OF 06/17/2007    Past Surgical History:  Procedure Laterality Date  . CHOLECYSTECTOMY  04/2000  . inguinal herniorrhapy    . LUMBAR LAMINECTOMY     x 2  . s/p Bilroth II    . s/p bowel obstruction surgury  12/01  . s/p left middle ear surgury  late 74's   Dr. Juanell Fairly  . TIBIA IM NAIL INSERTION Left 06/22/2016   Procedure: INTRAMEDULLARY (IM) NAIL TIBIAL;  Surgeon: Leandrew Koyanagi, MD;  Location: [facility];  Service: Orthopedics;  Laterality: Left;       Home Medications    Prior to Admission medications   Medication Sig Start Date End Date Taking? Authorizing Provider  albuterol (PROVENTIL) (2.5 MG/3ML) 0.083% nebulizer solution Take 2.5 mg by nebulization every 6 (six) hours as needed for wheezing or shortness of breath.   Yes Historical Provider, MD  Amino Acids-Protein Hydrolys (FEEDING SUPPLEMENT, PRO-STAT SUGAR FREE 64,) LIQD Take 30 mLs by mouth 2 (two) times daily.   Yes Historical Provider, MD  aspirin 81 MG chewable tablet Chew 81 mg by mouth daily.   Yes Historical Provider, MD  bisacodyl (DULCOLAX) 10 MG suppository Place 10 mg rectally every three (3) days as needed for moderate constipation.   Yes Historical Provider, MD  calcium carbonate (TUMS - DOSED IN MG ELEMENTAL CALCIUM) 500 MG chewable tablet Chew 1 tablet by mouth daily.    Yes Historical Provider, MD  cholecalciferol (VITAMIN D) 1000 units tablet Take 1,000 Units by mouth daily.   Yes Historical Provider, MD  magnesium hydroxide (MILK OF MAGNESIA) 400 MG/5ML suspension Take 30 mLs by mouth daily as needed for mild constipation.   Yes Historical Provider, MD  mirtazapine (REMERON) 7.5 MG tablet Take 1 tablet (7.5 mg total) by mouth at bedtime. 07/25/16  Yes Lauree Chandler, NP  Multiple Vitamins-Minerals (DECUBI-VITE) CAPS Take 1 capsule by mouth daily.   Yes Historical Provider, MD  omeprazole (PRILOSEC) 20 MG capsule Take 1 capsule (20 mg total) by mouth 2 (two) times daily before a meal. 10/01/15  Yes Biagio Borg, MD  oxyCODONE (ROXICODONE) 5 MG immediate release tablet Take 1 tablet (5 mg total) by mouth every 6 (six) hours as needed. 07/07/16  Yes Silver Huguenin Elgergawy, MD  polyethylene glycol (MIRALAX / GLYCOLAX) packet Take 17 g by mouth daily. Patient taking differently: Take 17 g by mouth daily as needed for mild constipation.  07/07/16  Yes Albertine Patricia, MD  saccharomyces boulardii (FLORASTOR) 250 MG capsule Take 250 mg by mouth 2 (two) times daily.   Yes Historical Provider, MD  senna (SENOKOT) 8.6 MG tablet Take 1 tablet by mouth at bedtime.    Yes Historical Provider, MD  Sodium Phosphates (RA SALINE ENEMA RE) Place 1 each rectally as needed (for constipation).   Yes Historical Provider, MD  UNABLE TO FIND Take 120 mLs by mouth 2 (two) times daily. Med Name: Med Pass   Yes Historical Provider, MD    Family History Family History  Problem Relation Age of Onset  . Diabetes Other     5 siblings  . Heart attack Mother   . Emphysema Father   . Breast cancer Sister     Social History Social History  Substance Use Topics  . Smoking status: Former Smoker    Quit date: 10/09/2004  . Smokeless tobacco: Never Used  . Alcohol use No     Allergies   Amoxicillin-pot clavulanate and Ibuprofen   Review of Systems Review of Systems  Unable to perform ROS: Acuity of condition    Physical Exam Updated Vital Signs BP 115/61   Pulse 118   Resp (!) 44   Ht 5\' 10"  (1.778 m)   Wt 104 lb (47.2 kg)   SpO2 (!) 81%   BMI 14.92 kg/m   Physical Exam  Constitutional: He is oriented to person, place, and time. Vital signs are normal. He appears well-developed and well-nourished.  Non-toxic appearance. He does not appear ill.  HENT:  Head: Normocephalic and atraumatic.  Nose: Nose normal.  Mouth/Throat: Oropharynx is clear and moist. No oropharyngeal exudate.  Eyes: Conjunctivae and EOM are normal. Pupils are equal, round, and reactive to light. No scleral icterus.  Neck: Normal range  of motion. Neck supple. No tracheal deviation, no edema, no erythema and normal range of motion present. No thyroid mass and no thyromegaly present.  Cardiovascular: Regular rhythm, S1 normal, S2 normal, intact distal pulses and normal pulses.  Exam reveals gallop. Exam reveals no friction rub.   No murmur heard. Tachycardic.   Pulmonary/Chest: Breath sounds normal. He has no wheezes. He has no rhonchi. He has no rales.  Tachypneic  Abdominal: Soft. Normal appearance and bowel sounds are normal. He exhibits no distension, no ascites and no mass. There is no hepatosplenomegaly. There is no tenderness. There is no rebound, no guarding and no CVA tenderness.  Musculoskeletal: Normal range of motion. He exhibits no edema or tenderness.  There are wounds to the bilateral lower extremities. The patient is wearing soft boots.   Lymphadenopathy:    He has no cervical adenopathy.  Neurological: He is alert and oriented to person, place, and time. He has normal strength. No cranial nerve deficit or sensory deficit.  Skin: Skin is warm, dry and intact. No petechiae and no rash noted. He is not diaphoretic. No erythema. No pallor.  Tactile fever present.   Nursing note and vitals reviewed.    ED Treatments / Results  DIAGNOSTIC STUDIES:  Oxygen Saturation is 92% on nasal cannula which is low by my interpretation.     Labs (all labs ordered are listed, but only abnormal results are displayed) Labs Reviewed  I-STAT CG4 LACTIC ACID, ED - Abnormal; Notable for the following:       Result Value   Lactic Acid, Venous 6.49 (*)    All other components within normal limits  CULTURE, BLOOD (ROUTINE X 2)  CULTURE, BLOOD (ROUTINE X 2)  URINE CULTURE  COMPREHENSIVE METABOLIC PANEL  CBC WITH DIFFERENTIAL/PLATELET  URINALYSIS, ROUTINE W REFLEX MICROSCOPIC    EKG  EKG Interpretation None       Radiology No results found.  Procedures Procedures (including critical care time)  Medications  Ordered in ED Medications  levofloxacin (LEVAQUIN) IVPB 750 mg (not administered)  aztreonam (AZACTAM) 2 g in dextrose 5 % 50 mL IVPB (not administered)  vancomycin (VANCOCIN) IVPB 1000 mg/200 mL premix (not administered)  sodium chloride 0.9 % bolus 1,000 mL (not administered)     Initial Impression / Assessment and Plan / ED Course  I have reviewed the triage vital signs and the nursing notes.  Pertinent labs & imaging results that were available during my care of the patient were reviewed by me  and considered in my medical decision making (see chart for details).  Clinical Course    Patient presents to the ED for SOB and AMS.  He was septic on arrival with hypoxia, tachypnea, and tachycardia.  Likely HCAP.  Code sepsis was called and patient given IV abx and IVF.  CXR reveals multifocal pneumonia.  Dr. Porfirio Mylar was paged for admission but he request CC consultation for Lactate 6.5.  CC will see the patient.  Dr. Porfirio Mylar is also coming to evaluate the patient.  He remains critical.  No longer hypoxic on [place].  Will continue to monitor while in the ED.    CRITICAL CARE Performed by: Sean [name]   Total critical care time: 40 minutes - sepsis  Critical care time was exclusive of separately billable procedures and treating other patients.  Critical care was necessary to treat or prevent imminent or life-threatening deterioration.  Critical care was time spent personally by me on the following activities: development of treatment plan with patient and/or surrogate as well as nursing, discussions with consultants, evaluation of patient's response to treatment, examination of patient, obtaining history from patient or surrogate, ordering and performing treatments and interventions, ordering and review of laboratory studies, ordering and review of radiographic studies, pulse oximetry and re-evaluation of patient's condition.     Final Clinical Impressions(s) / ED Diagnoses   Final diagnoses:  None     New Prescriptions New Prescriptions   No medications on file     I personally performed the services described in this documentation, which was scribed in my presence. The recorded information has been reviewed and is accurate.       Sean Balls, MD 08/13/2016 [phone]

## 2016-08-08 NOTE — Telephone Encounter (Signed)
May d/c immobilizer

## 2016-08-08 NOTE — Progress Notes (Signed)
No charge note.  Recheck on the patient.  Many family members at bedside.  Patient on non rebreather mask (bipap had become uncomfortable).  Patient awake alert in good humor.  Denies pain.     dtr in law mentions that patient has been hallucinating for the past two days.  Seeing a friend who died last year.  Will give full liquid diet with asp precautions.   Patient likes chocolate ensure.  Imogene Burn, [place] [facility] Pager: [phone]

## 2016-08-08 NOTE — Progress Notes (Signed)
Nutrition Brief Note  Chart reviewed due to low Braden score. Palliative Care Team following. Hospital death expected vs transfer to [facility] if he stabilizes. Remains NPO. No nutrition interventions warranted at this time.  Please re-consult as needed.   Molli Barrows, RD, LDN, [place] Pager [phone] After Hours Pager [phone]

## 2016-08-08 NOTE — Progress Notes (Signed)
I had an extensive conversation with the patient's son, Harrie Jeans, about the patient's code status. Harrie Jeans states he knows his father would not want to be on life support machines. He notes a significant decline in his father's health recently. He agrees that patient is to be DNR and he does not want aggressive measures to be taken. Hospitalist, Dr. Blaine Hamper at bedside also present for conversation. [facility] will admit to step down, PCCM will consult.    Albin Felling, MD, MPH Internal Medicine Resident, PGY-III Pager: [phone]

## 2016-08-08 NOTE — H&P (Addendum)
History and Physical    Sean [name] [id number] DOB: 05/19/1925 DOA: 08/06/2016  Referring MD/NP/PA:   PCP: Cathlean Cower, MD   Patient coming from:  The patient is coming from SNF.  At baseline, pt is dependent for most of ADL.        Chief Complaint: fever, SOB, AMS  HPI: Sean [name] is a 83 y.o. male with medical history significant of hypertension, hyperlipidemia, COPD, GERD, depression, alcohol abuse in remission, alcoholic liver cirrhosis, aortic regurgitation, atrial fibrillation not on anticoagulants due to GI bleeding, who presents with fever, AMS and shortness of breath.  Per pt's son, patient is currently residing in a rehab facility after sustaining a L tib-fib fracture in early December 2017. Pt becomes confused in the past 2 days. He developed fever of 101.7, chill, dry cough and SOB. He may also has mild CP per his son. Per report, pt nursing staff found the patient in his bed having removed his nasal cannula and was hypoxic with O2 sat of 77 percent. Pt dose not seem have nausea, vomiting, diarrhea, abdominal pain per his son. Patient moves all extremities.  ED Course: pt was found to have WBC 7.1, lactate 6.5, urinalysis with small amount of leukocyte, acute renal injury with creatinine 1.41, tachycardia, tachypnea, hypotension with blood pressure down to 85/48, oxygen saturation 81% on room air. Chest x-ray showed multifocal infiltration. Pt is admitted to SDU as inpt. PCCM was consulted.  Review of Systems: Cannot be reviewed accurately due to altered mental status.  Allergy:  Allergies  Allergen Reactions  . Amoxicillin-Pot Clavulanate     REACTION: rash  . Ibuprofen     REACTION: itching    Past Medical History:  Diagnosis Date  . ABUSE, ALCOHOL, IN REMISSION 06/17/2007  . Adenomatous colon polyp 01/1984  . Alcoholic cirrhosis of liver ([place]) 06/17/2007  . Aortic regurgitation 03/11/2016  . BLADDER CANCER 06/17/2007  . Cardiomyopathy ([place]) 03/11/2016  .  CAROTID ARTERY DISEASE 07/23/2009  . COMPRESSION FRACTURE, LUMBAR VERTEBRAE 08/09/2009  . COPD 09/18/2008  . DIVERTICULOSIS, COLON 06/17/2007  . ECHOCARDIOGRAM, ABNORMAL 07/06/2008  . GERD 06/17/2007  . GLUCOSE INTOLERANCE 06/17/2007  . Hiatal hernia   . HYPERLIPIDEMIA 06/17/2007  . HYPERTENSION 06/17/2007  . Impaired glucose tolerance 12/13/2010  . Internal hemorrhoids   . LOW BACK PAIN, CHRONIC 12/17/2009  . OSTEOARTHRITIS, KNEES, BILATERAL 06/19/2009  . OSTEOPOROSIS 06/17/2007  . PEPTIC ULCER DISEASE 06/17/2007  . PSA, INCREASED 09/18/2008  . RENAL INSUFFICIENCY 09/18/2008  . Tubulovillous adenoma 04/2000   Duodenal polyp  . UNSPECIFIED SUDDEN HEARING LOSS 03/20/2009  . VITAMIN D DEFICIENCY 09/18/2008    Past Surgical History:  Procedure Laterality Date  . CHOLECYSTECTOMY  04/2000  . inguinal herniorrhapy    . LUMBAR LAMINECTOMY     x 2  . s/p Bilroth II    . s/p bowel obstruction surgury  12/01  . s/p left middle ear surgury  late 55's   Dr. Juanell Fairly  . TIBIA IM NAIL INSERTION Left 06/22/2016   Procedure: INTRAMEDULLARY (IM) NAIL TIBIAL;  Surgeon: Leandrew Koyanagi, MD;  Location: [facility];  Service: Orthopedics;  Laterality: Left;    Social History:  reports that he quit smoking about 11 years ago. He has never used smokeless tobacco. He reports that he does not drink alcohol or use drugs.  Family History:  Family History  Problem Relation Age of Onset  . Diabetes Other     5 siblings  . Heart attack Mother   .  Emphysema Father   . Breast cancer Sister      Prior to Admission medications   Medication Sig Start Date End Date Taking? Authorizing Provider  albuterol (PROVENTIL) (2.5 MG/3ML) 0.083% nebulizer solution Take 2.5 mg by nebulization every 6 (six) hours as needed for wheezing or shortness of breath.   Yes Historical Provider, MD  Amino Acids-Protein Hydrolys (FEEDING SUPPLEMENT, PRO-STAT SUGAR FREE 64,) LIQD Take 30 mLs by mouth 2 (two) times daily.   Yes Historical  Provider, MD  aspirin 81 MG chewable tablet Chew 81 mg by mouth daily.   Yes Historical Provider, MD  bisacodyl (DULCOLAX) 10 MG suppository Place 10 mg rectally every three (3) days as needed for moderate constipation.   Yes Historical Provider, MD  calcium carbonate (TUMS - DOSED IN MG ELEMENTAL CALCIUM) 500 MG chewable tablet Chew 1 tablet by mouth daily.    Yes Historical Provider, MD  cholecalciferol (VITAMIN D) 1000 units tablet Take 1,000 Units by mouth daily.   Yes Historical Provider, MD  magnesium hydroxide (MILK OF MAGNESIA) 400 MG/5ML suspension Take 30 mLs by mouth daily as needed for mild constipation.   Yes Historical Provider, MD  mirtazapine (REMERON) 7.5 MG tablet Take 1 tablet (7.5 mg total) by mouth at bedtime. 07/25/16  Yes Lauree Chandler, NP  Multiple Vitamins-Minerals (DECUBI-VITE) CAPS Take 1 capsule by mouth daily.   Yes Historical Provider, MD  omeprazole (PRILOSEC) 20 MG capsule Take 1 capsule (20 mg total) by mouth 2 (two) times daily before a meal. 10/01/15  Yes Biagio Borg, MD  oxyCODONE (ROXICODONE) 5 MG immediate release tablet Take 1 tablet (5 mg total) by mouth every 6 (six) hours as needed. 07/07/16  Yes Silver Huguenin Elgergawy, MD  polyethylene glycol (MIRALAX / GLYCOLAX) packet Take 17 g by mouth daily. Patient taking differently: Take 17 g by mouth daily as needed for mild constipation.  07/07/16  Yes Albertine Patricia, MD  saccharomyces boulardii (FLORASTOR) 250 MG capsule Take 250 mg by mouth 2 (two) times daily.   Yes Historical Provider, MD  senna (SENOKOT) 8.6 MG tablet Take 1 tablet by mouth at bedtime.    Yes Historical Provider, MD  Sodium Phosphates (RA SALINE ENEMA RE) Place 1 each rectally as needed (for constipation).   Yes Historical Provider, MD  UNABLE TO FIND Take 120 mLs by mouth 2 (two) times daily. Med Name: Med Pass   Yes Historical Provider, MD    Physical Exam: Vitals:   08/03/2016 0245 08/05/2016 0301 07/29/2016 0345 08/24/2016 0414  BP: 93/73  95/55 (!) 88/72   Pulse: 118 113 102   Resp: (!) 31 (!) 34 (!) 37   SpO2: 98% 94% 93% 94%  Weight:      Height:       General: Not in acute distress. Cachectic looking HEENT:       Eyes: PERRL, EOMI, no scleral icterus.       ENT: No discharge from the ears and nose, no pharynx injection, no tonsillar enlargement.        Neck: No JVD, no bruit, no mass felt. Heme: No neck lymph node enlargement. Cardiac: S1/S2, RRR, No murmurs, No gallops or rubs. Respiratory: has rales bilaterally. No wheezing or rubs. GI: Soft, nondistended, nontender, no rebound pain, no organomegaly, BS present. GU: No hematuria Ext: No pitting leg edema bilaterally. 2+DP/PT pulse bilaterally. Musculoskeletal: No joint deformities, No joint redness or warmth, no limitation of ROM in spin. Skin: has pressure ulcer in left  leg and sacral area Neuro: confused, knows his son's name, cranial nerves II-XII grossly intact, moves all extremities normally. Marland Kitchen Psych: Patient is not psychotic, no suicidal or hemocidal ideation.  Labs on Admission: I have personally reviewed following labs and imaging studies  CBC:  Recent Labs Lab 08/17/2016 0110  WBC 3.1*  NEUTROABS 2.6  HGB 10.9*  HCT 32.8*  MCV 94.8  PLT 0000000   Basic Metabolic Panel:  Recent Labs Lab 08/10/2016 0110  NA 135  K 3.6  CL 100*  CO2 21*  GLUCOSE 137*  BUN 43*  CREATININE 1.41*  CALCIUM 9.2   GFR: Estimated Creatinine Clearance: 23.7 mL/min (by C-G formula based on SCr of 1.41 mg/dL (H)). Liver Function Tests:  Recent Labs Lab 08/07/2016 0110  AST 50*  ALT 19  ALKPHOS 176*  BILITOT 0.9  PROT 6.0*  ALBUMIN 2.3*   No results for input(s): LIPASE, AMYLASE in the last 168 hours. No results for input(s): AMMONIA in the last 168 hours. Coagulation Profile:  Recent Labs Lab 08/22/2016 0410  INR 1.47   Cardiac Enzymes: No results for input(s): CKTOTAL, CKMB, CKMBINDEX, TROPONINI in the last 168 hours. BNP (last 3 results) No results  for input(s): PROBNP in the last 8760 hours. HbA1C: No results for input(s): HGBA1C in the last 72 hours. CBG: No results for input(s): GLUCAP in the last 168 hours. Lipid Profile: No results for input(s): CHOL, HDL, LDLCALC, TRIG, CHOLHDL, LDLDIRECT in the last 72 hours. Thyroid Function Tests: No results for input(s): TSH, T4TOTAL, FREET4, T3FREE, THYROIDAB in the last 72 hours. Anemia Panel: No results for input(s): VITAMINB12, FOLATE, FERRITIN, TIBC, IRON, RETICCTPCT in the last 72 hours. Urine analysis:    Component Value Date/Time   COLORURINE AMBER (A) 08/25/2016 0200   APPEARANCEUR HAZY (A) 08/19/2016 0200   LABSPEC 1.019 07/31/2016 0200   PHURINE 5.0 08/13/2016 0200   GLUCOSEU NEGATIVE 08/07/2016 0200   GLUCOSEU NEGATIVE 02/27/2016 1538   HGBUR NEGATIVE 07/30/2016 0200   BILIRUBINUR NEGATIVE 08/16/2016 0200   KETONESUR NEGATIVE 07/30/2016 0200   PROTEINUR NEGATIVE 08/17/2016 0200   UROBILINOGEN 1.0 02/27/2016 1538   NITRITE NEGATIVE 08/21/2016 0200   LEUKOCYTESUR SMALL (A) 07/28/2016 0200   Sepsis Labs: @LABRCNTIP (procalcitonin:4,lacticidven:4) )No results found for this or any previous visit (from the past 240 hour(s)).   Radiological Exams on Admission: Dg Chest Port 1 View  Result Date: 08/14/2016 CLINICAL DATA:  Shortness of breath. Decreased oxygen saturations. Tachycardia. Fever. EXAM: PORTABLE CHEST 1 VIEW COMPARISON:  07/24/2016 FINDINGS: Cardiac enlargement without vascular congestion. Emphysematous changes in the lungs. New left perihilar and basilar airspace infiltration likely representing pneumonia. Patchy infiltrates also suggested in the right hilar and basilar region but less prominent. No pleural effusions. No pneumothorax. Calcified and tortuous aorta. IMPRESSION: Chronic emphysematous changes in the lungs with developing infiltrates, more prominent on the left, suggesting pneumonia. Cardiac enlargement without vascular congestion. Electronically Signed    By: Lucienne Capers M.D.   On: 08/18/2016 01:43     EKG: Independently reviewed.  Atrial fibrillation, QTC 365, T-wave inversion in inferior leads.   Assessment/Plan Principal Problem:   HCAP (healthcare-associated pneumonia) Active Problems:   ABUSE, ALCOHOL, IN REMISSION   Essential hypertension   COPD (chronic obstructive pulmonary disease) (HCC)   GERD   Atrial fibrillation (HCC)   AKI (acute kidney injury) ([place])   Acute on chronic respiratory failure with hypoxia (HCC)   Sepsis ([place])   Acute encephalopathy   Septic shock (HCC)   Acute respiratory  failure with hypoxia due to HCAP and septic shock: Patient has fever, shortness breath, chest x-ray showed multifocal infiltration, consistent with HCAP. Pt has septic shock with altered mental status, elevated lactate at 6.4, hypotension, fever, tachycardia and tachypnea. Blood pressure is still running low. PCCM was consulted. CODE STATUS was discussed by PCCM at length with son at the bedside. Son decided not to pursue aggressive care at this time which include CPR, pressors or vent support.   - Will admit to SUD as inpt-->If patient deteriorates then will proceed with comfort care. - IV Vancomycin, Levaquin and aztreonam - Mucinex for cough  - prn Albuterol Nebs, Atrovent nebs for SOB - Urine legionella and S. pneumococcal antigen - Follow up blood culture x2, sputum culture and respiratory virus panel, plus Flu pcr - will get Procalcitonin and trend lactic acid level per sepsis protocol - IVF: 2.5L of NS bolus in ED, followed by  58mL per hour of NS  COPD exacerbation:  -see above   GERD: -Protonix  Atrial Fibrillation: CHA2DS2-VASc Score is 3, needs oral anticoagulation, but patient is not on AC due to high risk of fall and GIB. HR varies at 110. Not on node blocker. -Monitoring on Tele  AKI: Cre 1.41.  Likely due to prerenal vs. ATN. - IVF as above - Follow up renal function by BMP - Avoid NSAIDs  Acute  encephalopathy: due to hypoxia, septic shock -treat underlying issues as above.  Pressure ucler: -Consult to Wound Care  Protein calorie malnutrition-severe -Consult to nutrition   ED Sepsis - Reassessment   Performed at:    5:54 AM   Last Vitals:    Blood pressure (!) 88/72, pulse 102, resp. rate (!) 37, height 5\' 10"  (1.778 m), weight 47.2 kg (104 lb), SpO2 94 %.  Heart:                  Irregularly irregular rhythm  Lungs:     Has rales   Capillary Refill:   >2 seconds    Peripheral Pulse (include location): Brachial pulse weak   Skin (include color):              Pale     DVT ppx: SQ Lovenox Code Status: DNR Family Communication:  Yes, patient's  son  at bed side Disposition Plan:  Anticipate discharge back to previous SNF environment Consults called: PCCM, Dr. Nelda Marseille Admission status: SDU/inpation       Date of Service 08/22/2016    Ivor Costa [facility] Pager [phone]  If 7PM-7AM, please contact night-coverage [email or web address] Password [facility] 07/31/2016, 5:54 AM

## 2016-08-08 NOTE — Telephone Encounter (Signed)
See other message

## 2016-08-08 NOTE — Care Management Note (Signed)
Case Management Note  Patient Details  Name: Sean [name] MRN: [id number] Date of Birth: 06-22-1925  Subjective/Objective:   Patient with hx of  ETOH cirrhosis, mitral and aortic valve regurg, severe protein calorie malnutrition, atrial fibrillation not on anticoagulation secondary to GIB in December who was admitted on 08/18/2016 with septic shock due to HCAP.   plan is for comfort care in ,expect hospital expiration, if stabilizes then residential.  NCM will cont to follow for dc needs.               Action/Plan:   Expected Discharge Date:                  Expected Discharge Plan:  [place]  In-House Referral:     Discharge planning Services  CM Consult  Post Acute Care Choice:    Choice offered to:     DME Arranged:    DME Agency:     HH Arranged:    [place] Agency:     Status of Service:  In process, will continue to follow  If discussed at Long Length of Stay Meetings, dates discussed:    Additional Comments:  Zenon Mayo, RN 08/02/2016, 4:04 PM

## 2016-08-08 NOTE — Consult Note (Signed)
[place] Nurse wound consult note Reason for Consult:consulted for multiple pressure injuries Wound type: it is noted on the nursing flow sheets that this patient has heel, foot, toe, and sacral pressure injuires Pressure Injury POA: Yes Measurement: see nursing flow sheet Wound bed: see nursing flow sheet Drainage (amount, consistency, odor) see nursing flow sheet Periwound: see nursing flow sheet Dressing procedure/placement/frequency:  I have reviewed all the of the current dressings, the left knee is reported to be necrotic.  All of the current dressing are appropriate.  I discussed with the patient's bedside nurse.  At this time patient's family has decided to pursue comfort care and there are several family members at the bedside.  The bedside nurse reports patient will most like survive this hospital stay.  No wounds at this time need any odor control.  I will follow up if patient survives the weekend.    Re consult if needed, will not follow at this time. Thanks  Kaelan Emami [employer], RN,CWOCN, CNS [phone])

## 2016-08-08 NOTE — Telephone Encounter (Signed)
Pt nursing home calling to ask a couple questions about pt immobilizer on his left leg.   Butch Penny at [facility] [phone]

## 2016-08-08 NOTE — Progress Notes (Signed)
Pharmacy Antibiotic Note  Sean [name] is a 83 y.o. male admitted from SNF on 08/18/2016 with sepsis.  Pharmacy has been consulted for Vancomycin, Levaquin, and Aztreonam dosing.  Aztreonam 2gm, Levaquin 1000mg , and Vanc 1gm IV given in ED ~0200  Plan: Aztreonam 1gm IV q8h Vancomycin 500mg  IV q24h Levaquin  750mg  IV q48h Will f/u micro data, renal function, and pt's clinical condition Vanc trough prn   Height: 5\' 10"  (177.8 cm) Weight: 104 lb (47.2 kg) IBW/kg (Calculated) : 73  No data recorded.   Recent Labs Lab 08/09/2016 0110 08/02/2016 0131  WBC 3.1*  --   CREATININE 1.41*  --   LATICACIDVEN  --  6.49*    Estimated Creatinine Clearance: 23.7 mL/min (by C-G formula based on SCr of 1.41 mg/dL (H)).    Allergies  Allergen Reactions  . Amoxicillin-Pot Clavulanate     REACTION: rash  . Ibuprofen     REACTION: itching    Antimicrobials this admission: 1/12 Levaquin >>  1/12 Vanc >>  1/12 Aztreonam >>  Dose adjustments this admission: n/a  Microbiology results: 1/12 BCx x2:  1/12 UCx:     Thank you for allowing pharmacy to be a part of this patient's care.  Sherlon Handing, PharmD, BCPS Clinical pharmacist, pager [phone] 08/22/2016 5:10 AM

## 2016-08-08 NOTE — Progress Notes (Signed)
   Met w/ patient and family (wife).  Introduced Art gallery manager.   Will follow, as needed.   - Rev. [place] MDiv ThM

## 2016-08-08 NOTE — Consult Note (Signed)
Consultation Note Date: 08/01/2016   Patient Name: Sean [name]  DOB: 1924/09/09  MRN: [id number]  Age / Sex: 83 y.o., male  PCP: Sean Borg, MD Referring Physician: Jonetta Osgood, MD  Reason for Consultation: Establishing goals of care, Non pain symptom management and Pain control  HPI/Patient Profile: 83 y.o. male  with past medical history of ETOH cirrhosis, mitral and aortic valve regurg, severe protein calorie malnutrition, atrial fibrillation not on anticoagulation secondary to GIB in December who was admitted on 08/07/2016 with septic shock due to HCAP.   Clinical Assessment and Goals of Care: I met at the bedside with wife, son, grand dtr.  Then met privately with son and grand dtr.   Son is a first responder Armed forces logistics/support/administrative officer, Sean [name]), and grand dtr Sean [name]) is an ER physician.  All are aware the patient is actively dying and will likely not survive this hospitalization.  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  He was also in the [employer]" and jumped out of planes - until he hurt his back.  He now has chronic low back pain.  He has been cachectically thin for many years but remained very active until the day after Thanksgiving when he broke his leg.  Since then he has lived in a SNF and declined.  Despite the fact that the family knows he is dying his grand daughter expressed concern that medications/bipap/treatment not be withdrawal too quickly for 2 reasons - (1) if he is not uncomfortable with treatment why not treat him?, and (2) she wants to avoid complicated grief that she feels the family would endure if they didn't "try".  She wanted the family to have a day or so to say goodbye.  We discussed comfort vs aggressive treatment - the family all agreed COMFORT is the priority.  They do not want him uncomfortable. We discussed prolonging the dying process and that interventions like antibiotics,  fluids and oxygen/bipap are prolonging his dying process.  Further we discussed [facility] if he did stabilize enough to leave the hospital.  Primary Decision Maker:  NEXT OF KIN Son, Sean [name] is the decision maker per Sean [name].    SUMMARY OF RECOMMENDATIONS    Code Status/Advance Care Planning:  DNR    Symptom Management:   Dyspnea:  Morphine 1-4 q 15 min PRN.  Bipap for comfort only.  Anxiety, agitation:  Low dose ativan scheduled with PRN available as well  Eliminated aspirin, vitamins etc.  Oral pills not related to comfort.  Wound care already ordered  Back pain:  KPad  Secretions:  Robinul PRN  Recommend room monitor be placed on comfort mode to avoid alarms and distractions.  Additional Recommendations (Limitations, Scope, Preferences):  Anticipate a slow withdraw of care over next 48 hours that will allow the family time to say goodbye and process his death.  Palliative Prophylaxis:   Delirium Protocol, Oral Care and Turn Reposition  Psycho-social/Spiritual:   Desire for further Chaplaincy support: Yes ordered  Prognosis:  Hours - Days  Discharge Planning: Anticipated Hospital Death vs Hospice House if he stabilizes.  Too unstable to transport at this point.      Primary Diagnoses: Present on Admission: . ABUSE, ALCOHOL, IN REMISSION . Atrial fibrillation with RVR ([place]) . COPD (chronic obstructive pulmonary disease) ([place]) . GERD . AKI (acute kidney injury) ([place]) . HCAP (healthcare-associated pneumonia) . Acute on chronic respiratory failure with hypoxia ([place]) . Sepsis ([place]) . Acute encephalopathy . Septic shock ([place])   I have reviewed the medical record, interviewed the patient and family, and examined the patient. The following aspects are pertinent.  Past Medical History:  Diagnosis Date  . ABUSE, ALCOHOL, IN REMISSION 06/17/2007  . Adenomatous colon polyp 01/1984  . Alcoholic cirrhosis of liver ([place]) 06/17/2007  . Aortic  regurgitation 03/11/2016  . BLADDER CANCER 06/17/2007  . Cardiomyopathy ([place]) 03/11/2016  . CAROTID ARTERY DISEASE 07/23/2009  . COMPRESSION FRACTURE, LUMBAR VERTEBRAE 08/09/2009  . COPD 09/18/2008  . DIVERTICULOSIS, COLON 06/17/2007  . ECHOCARDIOGRAM, ABNORMAL 07/06/2008  . GERD 06/17/2007  . GLUCOSE INTOLERANCE 06/17/2007  . Hiatal hernia   . HYPERLIPIDEMIA 06/17/2007  . HYPERTENSION 06/17/2007  . Impaired glucose tolerance 12/13/2010  . Internal hemorrhoids   . LOW BACK PAIN, CHRONIC 12/17/2009  . OSTEOARTHRITIS, KNEES, BILATERAL 06/19/2009  . OSTEOPOROSIS 06/17/2007  . PEPTIC ULCER DISEASE 06/17/2007  . PSA, INCREASED 09/18/2008  . RENAL INSUFFICIENCY 09/18/2008  . Tubulovillous adenoma 04/2000   Duodenal polyp  . UNSPECIFIED SUDDEN HEARING LOSS 03/20/2009  . VITAMIN D DEFICIENCY 09/18/2008   Social History   Social History  . Marital status: Married    Spouse name: N/A  . Number of children: 2  . Years of education: N/A   Occupational History  . 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 Smoking status: Former Smoker    Quit date: 10/09/2004  . Smokeless tobacco: Never Used  . Alcohol use No  . Drug use: No  . Sexual activity: Not Asked   Other Topics Concern  . None   Social History Narrative  . None   Family History  Problem Relation Age of Onset  . Diabetes Other     5 siblings  . Heart attack Mother   . Emphysema Father   . Breast cancer Sister    Scheduled Meds: . dextromethorphan-guaiFENesin  1 tablet Oral BID  . feeding supplement (PRO-STAT SUGAR FREE 64)  30 mL Oral BID  . ipratropium  0.5 mg Nebulization Q6H WA  . levalbuterol  1.25 mg Nebulization Q6H WA  . [START ON 08/10/2016] levofloxacin (LEVAQUIN) IV  750 mg Intravenous Q48H  . LORazepam  0.5 mg Intravenous BID  . mirtazapine  7.5 mg Oral QHS  . sodium chloride  500 mL  Intravenous Once   Continuous Infusions: . sodium chloride 75 mL/hr at 08/01/2016 0345   PRN Meds:.acetaminophen, bisacodyl, LORazepam, magnesium hydroxide, morphine injection, ondansetron Allergies  Allergen Reactions  . Amoxicillin-Pot Clavulanate     REACTION: rash  . Ibuprofen     REACTION: itching   Review of Systems patient on BiPap but is able to tell me he hurts all over.  Physical Exam  Cachectic frail man.  Gray complexion.  Multiple family members at bedside. On BiPap with increased work of breathing Tachycardic Multiple wounds on lower extremities.  Bandages clean and dry.  Vital  Signs: BP (!) 85/37   Pulse 97   Temp 98.4 F (36.9 C) (Oral)   Resp (!) 30   Ht 6' (1.829 m)   Wt 43.9 kg (96 lb 12.5 oz)   SpO2 (!) 88%   BMI 13.13 kg/m  Pain Assessment: No/denies pain   Pain Score: 0-No pain   SpO2: SpO2: (!) 88 % O2 Device:SpO2: (!) 88 % O2 Flow Rate: .O2 Flow Rate (L/min): 15 L/min  IO: Intake/output summary:  Intake/Output Summary (Last 24 hours) at 08/09/2016 1038 Last data filed at 07/28/2016 0602  Gross per 24 hour  Intake             2200 ml  Output                0 ml  Net             2200 ml    LBM: Last BM Date: 07/28/2016 Baseline Weight: Weight: 47.2 kg (104 lb) Most recent weight: Weight: 43.9 kg (96 lb 12.5 oz)     Palliative Assessment/Data:     Time In: 9:00 Time Out: 10:10 Time Total: 70 min. Greater than 50%  of this time was spent counseling and coordinating care related to the above assessment and plan.  Signed by: Imogene Burn, PA-C Palliative Medicine Pager: [phone]  Please contact Palliative Medicine Team phone at [phone] for questions and concerns.  For individual provider: See Shea Evans

## 2016-08-08 NOTE — Progress Notes (Signed)
PROGRESS NOTE        PATIENT DETAILS Name: Sean [name] Age: 83 y.o. Sex: male Date of Birth: 09/18/1924 Admit Date: 08/21/2016 Admitting Physician Ivor Costa, MD [id number] Jenny Reichmann, MD  Brief Narrative: Patient is a 83 y.o. male with past medical history of COPD, atrial fibrillation not on anticoagulation due to GI bleeding, liver cirrhosis, currently a resident of a skilled nursing facility-admitted for acute hypoxemic respiratory failure and septic shock due to healthcare associated pneumonia. See below for further details  Subjective: Very cachectic, tachypneic-but awake and alert. 100% nonrebreather mask.  Assessment/Plan: Acute on chronic hypoxemic respiratory failure: Likely secondary to HCAP-on 100% nonrebreather mask this morning. Spoke with patient's spouse, son and granddaughter (is a ED M.D.)-explained poor overall prognoses-recommended that probably best to transition to comfort measures, family was initially agreeable, but later informed by RN that they would like to try BiPAP. I have asked palliative care to evaluate, keep the BiPAP at bedside and use when necessary. Would continue to keep nothing by mouth.  Septic shock due to healthcare associated pneumonia: Continues to be hypotensive in spite of IV fluid boluses given in the emergency room. Seen by critical care, recommendations are to try antimicrobial therapy-if no improvement-transition to comfort measures. Unfortunately continues to be hypotensive, although DO NOT RESUSCITATE in place, family at this time would like to continue with antimicrobial therapy, BiPAP. Patient is very cachectic, looks like he has been in very poor overall health for a while-I suspect that his prognosis is very poor and would benefit from initiation of comfort measures. Note, cultures pending, influenza panel negative.  Acute kidney injury: Likely hemodynamic mediated acute kidney injury in a setting of sepsis/septic shock.  Continue IV fluid resuscitation, repeat electrolytes tomorrow morning.  Acute metabolic encephalopathy: Due to hypoxia, hypotension. Seems to be somewhat improved.  A. fib with RVR: Heart rate in the low 100s, unable to use beta blocker or Cardizem and due to hypotension. Not a anticoagulation candidate. Chads2vasc score 3  GERD: Continue PPI  COPD: No rhonchi or-I suspect his acute respiratory failure is from pneumonia rather than COPD exacerbation. Continue bronchodilators.   History of chronic hypoxemic respiratory failure: Likely secondary to COPD, on home O2-2.5 L/m  Left heel/left lower extremity wounds: Has a dressing in place-I did not open-I will obtain a wound care evaluation. Note all these ulcers were present prior to this hospitalization  Severe protein calorie malnutrition: Appears to be very cachectic-patient improves and oral intake is established-will need nutrition evaluation for supplements.  Recent left tibia/fibular fracture: Followed by Dr Erlinda Hong in the outpatient setting.  History of alcoholic liver cirrhosis  Goals of care/palliative care: Spoke with patient's spouse, son and granddaughter at bedside. Patient is very frail-cachectic-appears to be severely malnourished-has had numerous frequent hospitalizations over the past few months-he has septic shock from extensive multifocal pneumonia. His overall prognosis is extremely poor. Family initially was agreeable to transition to comfort measures, however I was subsequently called by the nurse-and family at this time wants to continue with current antimicrobial therapy and wants a trial of BiPAP. I have consulted palliative care, I will recheck on the patient later this afternoon to see if there has been any tangible improvement. Will continue engagement with family.   DVT Prophylaxis: Prophylactic Lovenox   Code Status:  DNR  Family Communication: Spouse/Son/Grand daughter at bedside  Disposition Plan: Remain  inpatient-may require residential hospice if no improvement  Antimicrobial agents: Anti-infectives    Start     Dose/Rate Route Frequency Ordered Stop   08/10/16 0400  levofloxacin (LEVAQUIN) IVPB 750 mg     750 mg 100 mL/hr over 90 Minutes Intravenous Every 48 hours 08/07/2016 0516     08/09/16 0400  vancomycin (VANCOCIN) 500 mg in sodium chloride 0.9 % 100 mL IVPB     500 mg 100 mL/hr over 60 Minutes Intravenous Every 24 hours 08/21/2016 0516     08/09/2016 1000  aztreonam (AZACTAM) 1 g in dextrose 5 % 50 mL IVPB     1 g 100 mL/hr over 30 Minutes Intravenous Every 8 hours 08/10/2016 0516     08/05/2016 0130  levofloxacin (LEVAQUIN) IVPB 750 mg     750 mg 100 mL/hr over 90 Minutes Intravenous  Once 08/16/2016 0128 08/20/2016 0310   08/05/2016 0130  aztreonam (AZACTAM) 2 g in dextrose 5 % 50 mL IVPB     2 g 100 mL/hr over 30 Minutes Intravenous  Once 08/22/2016 0128 08/24/2016 0210   08/06/2016 0130  vancomycin (VANCOCIN) IVPB 1000 mg/200 mL premix     1,000 mg 200 mL/hr over 60 Minutes Intravenous  Once 08/17/2016 0128 08/15/2016 0240      Procedures: None  CONSULTS:  pulmonary/intensive care  Time spent: 35 minutes-Greater than 50% of this time was spent in counseling, explanation of diagnosis, planning of further management, and coordination of care.  MEDICATIONS: Scheduled Meds: . aspirin  81 mg Oral Daily  . aztreonam  1 g Intravenous Q8H  . calcium carbonate  1 tablet Oral Daily  . cholecalciferol  1,000 Units Oral Daily  . dextromethorphan-guaiFENesin  1 tablet Oral BID  . enoxaparin (LOVENOX) injection  30 mg Subcutaneous Q24H  . feeding supplement (PRO-STAT SUGAR FREE 64)  30 mL Oral BID  . ipratropium  0.5 mg Nebulization Q6H WA  . levalbuterol  1.25 mg Nebulization Q6H WA  . [START ON 08/10/2016] levofloxacin (LEVAQUIN) IV  750 mg Intravenous Q48H  . mirtazapine  7.5 mg Oral QHS  . multivitamin with minerals  1 tablet Oral Daily  . pantoprazole  40 mg Oral Daily  . saccharomyces  boulardii  250 mg Oral BID  . senna  1 tablet Oral QHS  . sodium chloride  500 mL Intravenous Once  . [START ON 08/09/2016] vancomycin  500 mg Intravenous Q24H   Continuous Infusions: . sodium chloride 75 mL/hr at 08/20/2016 0345   PRN Meds:.acetaminophen, bisacodyl, LORazepam, magnesium hydroxide, morphine injection, ondansetron, oxyCODONE, polyethylene glycol   PHYSICAL EXAM: Vital signs: Vitals:   08/04/2016 0414 08/13/2016 0649 07/30/2016 0654 08/22/2016 0728  BP:   92/76   Pulse:  (!) 106  (!) 115  Resp:  (!) 40  (!) 36  Temp:  98.4 F (36.9 C)    TempSrc:  Oral    SpO2: 94% (!) 88%  92%  Weight:   43.9 kg (96 lb 12.5 oz)   Height:   6' (1.829 m)    Filed Weights   08/10/2016 0125 08/22/2016 0654  Weight: 47.2 kg (104 lb) 43.9 kg (96 lb 12.5 oz)   Body mass index is 13.13 kg/m.   General appearance :Awake, alert, not in any distress. Speech Clear. Not toxic Looking Eyes:, pupils equally reactive to light and accomodation,no scleral icterus.Pink conjunctiva HEENT: Atraumatic and Normocephalic Neck: supple, no JVD. No cervical lymphadenopathy. No thyromegaly Resp:Good air entry bilaterally, no added  sounds  CVS: S1 S2 regular, no murmurs.  GI: Bowel sounds present, Non tender and not distended with no gaurding, rigidity or rebound.No organomegaly Extremities: B/L Lower Ext shows no edema, both legs are warm to touch Neurology:  speech clear,Non focal, sensation is grossly intact. Psychiatric: Normal judgment and insight. Alert and oriented x 3. Normal mood. Musculoskeletal:No digital cyanosis Skin:No Rash, warm and dry Wounds:N/A  I have personally reviewed following labs and imaging studies  LABORATORY DATA: CBC:  Recent Labs Lab 08/15/2016 0110  WBC 3.1*  NEUTROABS 2.6  HGB 10.9*  HCT 32.8*  MCV 94.8  PLT 0000000    Basic Metabolic Panel:  Recent Labs Lab 08/10/2016 0110  NA 135  K 3.6  CL 100*  CO2 21*  GLUCOSE 137*  BUN 43*  CREATININE 1.41*  CALCIUM 9.2     GFR: Estimated Creatinine Clearance: 22.1 mL/min (by C-G formula based on SCr of 1.41 mg/dL (H)).  Liver Function Tests:  Recent Labs Lab 08/04/2016 0110  AST 50*  ALT 19  ALKPHOS 176*  BILITOT 0.9  PROT 6.0*  ALBUMIN 2.3*   No results for input(s): LIPASE, AMYLASE in the last 168 hours. No results for input(s): AMMONIA in the last 168 hours.  Coagulation Profile:  Recent Labs Lab 08/10/2016 0410  INR 1.47    Cardiac Enzymes: No results for input(s): CKTOTAL, CKMB, CKMBINDEX, TROPONINI in the last 168 hours.  BNP (last 3 results) No results for input(s): PROBNP in the last 8760 hours.  HbA1C: No results for input(s): HGBA1C in the last 72 hours.  CBG: No results for input(s): GLUCAP in the last 168 hours.  Lipid Profile: No results for input(s): CHOL, HDL, LDLCALC, TRIG, CHOLHDL, LDLDIRECT in the last 72 hours.  Thyroid Function Tests: No results for input(s): TSH, T4TOTAL, FREET4, T3FREE, THYROIDAB in the last 72 hours.  Anemia Panel: No results for input(s): VITAMINB12, FOLATE, FERRITIN, TIBC, IRON, RETICCTPCT in the last 72 hours.  Urine analysis:    Component Value Date/Time   COLORURINE AMBER (A) 07/29/2016 0200   APPEARANCEUR HAZY (A) 08/03/2016 0200   LABSPEC 1.019 08/14/2016 0200   PHURINE 5.0 08/01/2016 0200   GLUCOSEU NEGATIVE 08/01/2016 0200   GLUCOSEU NEGATIVE 02/27/2016 1538   HGBUR NEGATIVE 08/13/2016 0200   BILIRUBINUR NEGATIVE 08/17/2016 0200   KETONESUR NEGATIVE 08/09/2016 0200   PROTEINUR NEGATIVE 08/15/2016 0200   UROBILINOGEN 1.0 02/27/2016 1538   NITRITE NEGATIVE 08/01/2016 0200   LEUKOCYTESUR SMALL (A) 08/14/2016 0200    Sepsis Labs: Lactic Acid, Venous    Component Value Date/Time   LATICACIDVEN 5.6 (HH) 08/09/2016 [id number]    MICROBIOLOGY: No results found for this or any previous visit (from the past 240 hour(s)).  RADIOLOGY STUDIES/RESULTS: Dg Chest 2 View  Result Date: 07/24/2016 CLINICAL DATA:  Hypoxia. EXAM:  CHEST  2 VIEW COMPARISON:  Radiographs of June 20, 2016. FINDINGS: Stable cardiomediastinal silhouette. Atherosclerosis of thoracic aorta is noted. No pneumothorax or pleural effusion is noted. Hyperexpansion of the lungs is noted. Old left clavicular fracture is noted. IMPRESSION: No active cardiopulmonary disease. Hyperexpansion of the lungs suggesting chronic obstructive pulmonary disease. Aortic atherosclerosis. Electronically Signed   By: Marijo Conception, M.D.   On: 07/24/2016 10:13   Ct Angio Chest Pe W And/or Wo Contrast  Result Date: 07/24/2016 CLINICAL DATA:  Hypoxemia, left rib and chest pain EXAM: CT ANGIOGRAPHY CHEST WITH CONTRAST TECHNIQUE: Multidetector CT imaging of the chest was performed using the standard protocol during bolus administration of intravenous  contrast. Multiplanar CT image reconstructions and MIPs were obtained to evaluate the vascular anatomy. CONTRAST:  85 mL Isovue 370 COMPARISON:  None. FINDINGS: Cardiovascular: Satisfactory opacification of the pulmonary arteries to the segmental level. No evidence of pulmonary embolism. Normal heart size. No pericardial effusion. Abdominal aortic atherosclerosis. Mediastinum/Nodes: No enlarged mediastinal, hilar, or axillary lymph nodes. Thyroid gland, trachea, and esophagus demonstrate no significant findings. Lungs/Pleura: Bilateral centrilobular emphysema. Biapical scarring. Bilateral lower lobe airspace disease dependently which may reflect atelectasis versus pneumonia including aspiration pneumonia. No pleural effusion or pneumothorax. Upper Abdomen: No acute abnormality. Hypodense, fluid attenuating 2.9 cm left renal cyst. Musculoskeletal: No acute osseous abnormality. Old L2 vertebral body compression fracture status post augmentation. Review of the MIP images confirms the above findings. IMPRESSION: 1. No evidence of pulmonary embolus. 2. Bilateral lower lobe airspace disease dependently which may reflect atelectasis versus  pneumonia including aspiration pneumonia. 3.  Emphysema. [id number]) Electronically Signed   By: Kathreen Devoid   On: 07/24/2016 14:21   Dg Chest Port 1 View  Result Date: 08/26/2016 CLINICAL DATA:  Shortness of breath. Decreased oxygen saturations. Tachycardia. Fever. EXAM: PORTABLE CHEST 1 VIEW COMPARISON:  07/24/2016 FINDINGS: Cardiac enlargement without vascular congestion. Emphysematous changes in the lungs. New left perihilar and basilar airspace infiltration likely representing pneumonia. Patchy infiltrates also suggested in the right hilar and basilar region but less prominent. No pleural effusions. No pneumothorax. Calcified and tortuous aorta. IMPRESSION: Chronic emphysematous changes in the lungs with developing infiltrates, more prominent on the left, suggesting pneumonia. Cardiac enlargement without vascular congestion. Electronically Signed   By: Lucienne Capers M.D.   On: 08/02/2016 01:43   Xr Tibia/fibula Left  Result Date: 07/10/2016 Stable intramedullary fixation of proximal tibia fracture    LOS: 0 days   Oren Binet, MD  [facility] Pager:[phone]  If 7PM-7AM, please contact night-coverage [email or web address] Password [facility] 08/22/2016, 9:16 AM

## 2016-08-08 NOTE — Telephone Encounter (Signed)
See message.

## 2016-08-08 NOTE — Consult Note (Signed)
PULMONARY / CRITICAL CARE MEDICINE   Name: Sean [name] MRN: [id number] DOB: 1924-10-12    ADMISSION DATE:  08/17/2016 CONSULTATION DATE: 08/10/2016  REFERRING MD:  Ivor Costa MD  CHIEF COMPLAINT:  Acute Respiratory Failure and lactic acidosis   HISTORY OF PRESENT ILLNESS:   [name] is an 83 year old gentleman with history of COPD, hypertension, atrial fibrillation not on anticoagulants due to history of GI bleeding who presents to the ED with acute respiratory failure. Son was at the bedside and provided the history. Patient is currently residing in a rehabilitation facility after sustaining a L TIB-5B fracture in December 2017.  Patient has a 2 day history of confusion and today was found to be hypoxic satting at 77% with a fever of 101.7.  Patient was transferred to the ED and the admitting provider consulted PCCM for evaluation of lactic acidosis and acute respiratory failure. Upon evaluation patient is being transferred to stepdown.  CODE STATUS was discussed at length with son at the bedside. Son decided not to pursue aggressive care at this time which include CPR, pressors or vent support.  PAST MEDICAL HISTORY :  He  has a past medical history of ABUSE, ALCOHOL, IN REMISSION (06/17/2007); Adenomatous colon polyp (01/1984); Alcoholic cirrhosis of liver ([place]) (06/17/2007); Aortic regurgitation (03/11/2016); BLADDER CANCER (06/17/2007); Cardiomyopathy ([place]) (03/11/2016); CAROTID ARTERY DISEASE (07/23/2009); COMPRESSION FRACTURE, LUMBAR VERTEBRAE (08/09/2009); COPD (09/18/2008); DIVERTICULOSIS, COLON (06/17/2007); ECHOCARDIOGRAM, ABNORMAL (07/06/2008); GERD (06/17/2007); GLUCOSE INTOLERANCE (06/17/2007); Hiatal hernia; HYPERLIPIDEMIA (06/17/2007); HYPERTENSION (06/17/2007); Impaired glucose tolerance (12/13/2010); Internal hemorrhoids; LOW BACK PAIN, CHRONIC (12/17/2009); OSTEOARTHRITIS, KNEES, BILATERAL (06/19/2009); OSTEOPOROSIS (06/17/2007); PEPTIC ULCER DISEASE (06/17/2007); PSA, INCREASED  (09/18/2008); RENAL INSUFFICIENCY (09/18/2008); Tubulovillous adenoma (04/2000); UNSPECIFIED SUDDEN HEARING LOSS (03/20/2009); and VITAMIN D DEFICIENCY (09/18/2008).  PAST SURGICAL HISTORY: He  has a past surgical history that includes s/p bowel obstruction surgury (12/01); s/p Bilroth II; Cholecystectomy (04/2000); Lumbar laminectomy; inguinal herniorrhapy; s/p left middle ear surgury (late 80's); and Tibia IM nail insertion (Left, 06/22/2016).  Allergies  Allergen Reactions  . Amoxicillin-Pot Clavulanate     REACTION: rash  . Ibuprofen     REACTION: itching    No current facility-administered medications on file prior to encounter.    Current Outpatient Prescriptions on File Prior to Encounter  Medication Sig  . Amino Acids-Protein Hydrolys (FEEDING SUPPLEMENT, PRO-STAT SUGAR FREE 64,) LIQD Take 30 mLs by mouth 2 (two) times daily.  . bisacodyl (DULCOLAX) 10 MG suppository Place 10 mg rectally every three (3) days as needed for moderate constipation.  . calcium carbonate (TUMS - DOSED IN MG ELEMENTAL CALCIUM) 500 MG chewable tablet Chew 1 tablet by mouth daily.   . cholecalciferol (VITAMIN D) 1000 units tablet Take 1,000 Units by mouth daily.  . mirtazapine (REMERON) 7.5 MG tablet Take 1 tablet (7.5 mg total) by mouth at bedtime.  . Multiple Vitamins-Minerals (DECUBI-VITE) CAPS Take 1 capsule by mouth daily.  Marland Kitchen omeprazole (PRILOSEC) 20 MG capsule Take 1 capsule (20 mg total) by mouth 2 (two) times daily before a meal.  . oxyCODONE (ROXICODONE) 5 MG immediate release tablet Take 1 tablet (5 mg total) by mouth every 6 (six) hours as needed.  . polyethylene glycol (MIRALAX / GLYCOLAX) packet Take 17 g by mouth daily. (Patient taking differently: Take 17 g by mouth daily as needed for mild constipation. )  . saccharomyces boulardii (FLORASTOR) 250 MG capsule Take 250 mg by mouth 2 (two) times daily.  Marland Kitchen senna (SENOKOT) 8.6 MG tablet Take 1 tablet by mouth at bedtime.  FAMILY HISTORY:  His  indicated that his mother is deceased. He indicated that his father is deceased. He indicated that both of his sisters are alive. He indicated that his brother is alive. He indicated that his maternal grandmother is deceased. He indicated that his maternal grandfather is deceased. He indicated that his paternal grandmother is deceased. He indicated that his paternal grandfather is deceased. He indicated that the status of his other is unknown.    SOCIAL HISTORY: He  reports that he quit smoking about 11 years ago. He has never used smokeless tobacco. He reports that he does not drink alcohol or use drugs.  REVIEW OF SYSTEMS:   ROS  Unable to obtain due to mild confusion and difficulty communicating with non-re breather in place    SUBJECTIVE:  He was able to communicate he had mild pain in his leg and pleuritic chest pain.  VITAL SIGNS: BP (!) 88/72   Pulse 102   Resp (!) 37   Ht 5\' 10"  (1.778 m)   Wt 47.2 kg (104 lb)   SpO2 94%   BMI 14.92 kg/m   HEMODYNAMICS:    VENTILATOR SETTINGS:    INTAKE / OUTPUT: No intake/output data recorded.  PHYSICAL EXAMINATION: General:  Cachectic appearing Neuro:  Unable to correctly state location and year.  Oriented to person HEENT:  [place]/AT, PERRLA Cardiovascular:  Tachycardic, no m/r/g Lungs:  Tachypneic Abdomen: BS +, non distended, non tender Musculoskeletal:  Able to move upper extremities, no lower extremity swelling, lower extremities in soft boots Skin:  Warm and dry, no rashes  LABS:  BMET  Recent Labs Lab 08/13/2016 0110  NA 135  K 3.6  CL 100*  CO2 21*  BUN 43*  CREATININE 1.41*  GLUCOSE 137*    Electrolytes  Recent Labs Lab 08/14/2016 0110  CALCIUM 9.2    CBC  Recent Labs Lab 08/05/2016 0110  WBC 3.1*  HGB 10.9*  HCT 32.8*  PLT 249    Coag's  Recent Labs Lab 08/01/2016 0410  APTT 39*  INR 1.47    Sepsis Markers  Recent Labs Lab 08/17/2016 0131  LATICACIDVEN 6.49*    ABG No results for  input(s): PHART, PCO2ART, PO2ART in the last 168 hours.  Liver Enzymes  Recent Labs Lab 08/10/2016 0110  AST 50*  ALT 19  ALKPHOS 176*  BILITOT 0.9  ALBUMIN 2.3*    Cardiac Enzymes No results for input(s): TROPONINI, PROBNP in the last 168 hours.  Glucose No results for input(s): GLUCAP in the last 168 hours.  Imaging Dg Chest Port 1 View  Result Date: 08/14/2016 CLINICAL DATA:  Shortness of breath. Decreased oxygen saturations. Tachycardia. Fever. EXAM: PORTABLE CHEST 1 VIEW COMPARISON:  07/24/2016 FINDINGS: Cardiac enlargement without vascular congestion. Emphysematous changes in the lungs. New left perihilar and basilar airspace infiltration likely representing pneumonia. Patchy infiltrates also suggested in the right hilar and basilar region but less prominent. No pleural effusions. No pneumothorax. Calcified and tortuous aorta. IMPRESSION: Chronic emphysematous changes in the lungs with developing infiltrates, more prominent on the left, suggesting pneumonia. Cardiac enlargement without vascular congestion. Electronically Signed   By: Lucienne Capers M.D.   On: 08/26/2016 01:43     STUDIES:  Chest X-ray 1/12 >> chronic emphysematous changes in the lungs with developing infiltrates  CULTURES: Blood cultures 1/12 >> pending Urine culture 1/12 >> pending Sputum culture 1/12 >> pending  ANTIBIOTICS: Aztreonam 1/12 >> Levofloxacin 1/12 >> Vancomycin 1/12 >>  SIGNIFICANT EVENTS: Patient was transferred from  current living rehabilitation facility to ED found to be in acute respiratory failure  LINES/TUBES: none  DISCUSSION: Sean [name] is an 83 year old male currently living in a rehabilitation facility after experiencing a left tib-fib fracture.  He has been having confusion for the past 2 days and found to be in acute respiratory failure and transferred to the ED. He had a lactic acid of 6.5 and PCCM was consulted. Upon further assessment patient has been  transferred to the stepdown unit.  ASSESSMENT / PLAN:  PULMONARY A: COPD, satting 93% on non-rebreather Acute Respiratory Failure with hypoxia Pulmonary infiltrates 2/2 possible CAP P:   Duonebs Aztreonam 1/12 >> Levofloxacin 1/12 >> Vancomycin 1/12 >> Respiratory Panel Influenza panel  Mucinex  CARDIOVASCULAR A:  Paroxysmal Atrial fibrillation, not on anticoagulations due to history of GI bleed Hypotension P:  Cardiac monitoring  Aspirin 81 mg IV fluids  RENAL A:   AK I, baseline creatinine 0.90 currently 1.41 Lactic acidosis P:   IV NS Likely need BMET, will defer to primary team  Lactic Acid  GASTROINTESTINAL A:   No issues P:   Prophylactic protonix  HEMATOLOGIC A:   Anemia of chronic disease Leukopenia, in setting of acute illness P:  Will likely need CBC, will defer to primary team  INFECTIOUS A:   Pulmonary infiltrates likely 2/2 CAP Sepsis 2/2 CAP P:   Aztreonam 1/12 >> Levofloxacin 1/12 >> Vancomycin 1/12 >> Follow up blood cultures Trend lactate IV fluids  ENDOCRINE A:   No issues P:   No plan  NEUROLOGIC A:   Patient is alert and oriented to person only Able to follow commands - open eyes, move arms and nod to answer questions P:   Continue to monitor  FAMILY  - Updates: Son was updated at the bedside about the care plan. Grand daughter was updated over the phone  - Inter-disciplinary family meet or Palliative Care meeting due by:  08/15/2016  Attending Note:  83 year old cachectic male with history of COPD who has been failing to thrive for a few months now after a leg fracture who presents to the hospital with hypoxemic respiratory failure and septic shock due to PNA.  On exam, decreased BS diffusely with SBP of 70.  I reviewed CXR myself, COPD and PNA noted.  Discussed with resident.  Patient is clearly decompensating and is slowly dying.  Aggressive care in this case would be futile given failure to thrive and rapid  deterioration since leg fracture.  Patient has essentially not been eating.  Had an extensive conversation with patient's son, chuck, who after discussion was agreeable to DNR status and if deteriorates then will need to discuss comfort care.  In the meantime, abx as ordered, hydrate, labs as needed, f/u on cultures.  If patient deteriorates then will proceed with comfort care.  TRH to admit.  PCCM will sign off, please call back if needed.  The patient is critically ill with multiple organ systems failure and requires high complexity decision making for assessment and support, frequent evaluation and titration of therapies, application of advanced monitoring technologies and extensive interpretation of multiple databases.   Critical Care Time devoted to patient care services described in this note is  35  Minutes. This time reflects time of care of this signee Dr Jennet Maduro. This critical care time does not reflect procedure time, or teaching time or supervisory time of PA/NP/Med student/Med Resident etc but could involve care discussion time.  Rush Farmer, M.D.  [place]. Pager: [phone]. After hours pager: [phone].  08/23/2016, 4:41 AM

## 2016-08-08 NOTE — Progress Notes (Signed)
Pt arrived to the unit accompanied by ED RN. Pt on NRB at 15L O2 sats 85%. Tachycardia, hypotension, Alert and oriented, somewhat anxious. He is very frail and thin and has numerous wounds. Son present at bedside, appears concerned but calm.  Will continue to monitor

## 2016-08-08 NOTE — ED Triage Notes (Signed)
Patient is from [place] here for shortness of breath.  O2 saturations were at 70 for staff at [facility].  Patient is tachycardic, with a rectal temp of 101.7 at tachypnea.  MD Oni at bedside with family.

## 2016-08-08 NOTE — Progress Notes (Signed)
Patient respiratory assessment done and patient scored a 13. Treatments are needed at this time, number so high due to patient being in comfort care at this time, requiring High Oxygen levels and and a congested weak cough . No wheezes anywhere just some rhonchi, which nebulizer of Xopenex and Atrovent will not help. Patient resting comfortably at this time. Will continue to monitor as needed.

## 2016-08-08 NOTE — Evaluation (Signed)
Clinical/Bedside Swallow Evaluation Patient Details  Name: Sean [name] MRN: [id number] Date of Birth: 1925-03-23  Today's Date: 08/22/2016 Time: SLP Start Time (ACUTE ONLY): 0835 SLP Stop Time (ACUTE ONLY): 0857 SLP Time Calculation (min) (ACUTE ONLY): 22 min  Past Medical History:  Past Medical History:  Diagnosis Date  . ABUSE, ALCOHOL, IN REMISSION 06/17/2007  . Adenomatous colon polyp 01/1984  . Alcoholic cirrhosis of liver ([place]) 06/17/2007  . Aortic regurgitation 03/11/2016  . BLADDER CANCER 06/17/2007  . Cardiomyopathy ([place]) 03/11/2016  . CAROTID ARTERY DISEASE 07/23/2009  . COMPRESSION FRACTURE, LUMBAR VERTEBRAE 08/09/2009  . COPD 09/18/2008  . DIVERTICULOSIS, COLON 06/17/2007  . ECHOCARDIOGRAM, ABNORMAL 07/06/2008  . GERD 06/17/2007  . GLUCOSE INTOLERANCE 06/17/2007  . Hiatal hernia   . HYPERLIPIDEMIA 06/17/2007  . HYPERTENSION 06/17/2007  . Impaired glucose tolerance 12/13/2010  . Internal hemorrhoids   . LOW BACK PAIN, CHRONIC 12/17/2009  . OSTEOARTHRITIS, KNEES, BILATERAL 06/19/2009  . OSTEOPOROSIS 06/17/2007  . PEPTIC ULCER DISEASE 06/17/2007  . PSA, INCREASED 09/18/2008  . RENAL INSUFFICIENCY 09/18/2008  . Tubulovillous adenoma 04/2000   Duodenal polyp  . UNSPECIFIED SUDDEN HEARING LOSS 03/20/2009  . VITAMIN D DEFICIENCY 09/18/2008   Past Surgical History:  Past Surgical History:  Procedure Laterality Date  . CHOLECYSTECTOMY  04/2000  . inguinal herniorrhapy    . LUMBAR LAMINECTOMY     x 2  . s/p Bilroth II    . s/p bowel obstruction surgury  12/01  . s/p left middle ear surgury  late 29's   Dr. Juanell Fairly  . TIBIA IM NAIL INSERTION Left 06/22/2016   Procedure: INTRAMEDULLARY (IM) NAIL TIBIAL;  Surgeon: Leandrew Koyanagi, MD;  Location: [place];  Service: Orthopedics;  Laterality: Left;   HPI:  Sean [name] a 83 y.o.malewith medical history significant of hypertension, hyperlipidemia, COPD, GERD, depression, alcohol abuse in remission, alcoholic liver  cirrhosis, aortic regurgitation, atrial fibrillation not on anticoagulants due to GI bleeding, who presents with fever, AMSand shortness of breath.  Most recent chest xray is showing chronic emphysematous changes with developing infiltrates, worse on the left, consistent with PNA.     Assessment / Plan / Recommendation Clinical Impression  Clinical swallowing evaluation was completed using ice chips, thin liquids and pureed material.  Limited oral mechanism exam was completed due to the patient's difficulty following commands but the patient's lingual and labial range of motion was functional.  Strength was unable to be fully assessed.  Patient's cough is VERY weak.  The patient was noted to have an oropharyngeal dysphagia characterized by delayed oral transit, delayed swallow trigger and multiple swallows to clear each bolus with complaints of material sticking pharyngeally.  Given the patient's current hospital stay, respiratory status and clinical presentation during evaluation today safest recommendation is for the patient to remain  NPO and proceed with MBS.  Per the family they are most likely going to focus more on palliative treatment.  Given this ST will hold off on further work up until goals of care are determined.  ST will follow up on Monday to determine if continued ST involvement is desired.  The family was in agreement.      Aspiration Risk  Severe aspiration risk    Diet Recommendation  Safest recommendation is NPO.   Medication Administration: Via alternative means    Other  Recommendations Oral Care Recommendations: Oral care QID Other Recommendations: Have oral suction available;Remove water pitcher   Follow up Recommendations  (TBD)  Frequency and Duration min 1 x/week  2 weeks       Prognosis Prognosis for Safe Diet Advancement: Guarded Barriers to Reach Goals: Severity of deficits      Swallow Study   General Date of Onset: 08/26/2016 HPI: Sean [name] a  83 y.o.malewith medical history significant of hypertension, hyperlipidemia, COPD, GERD, depression, alcohol abuse in remission, alcoholic liver cirrhosis, aortic regurgitation, atrial fibrillation not on anticoagulants due to GI bleeding, who presents with fever, AMSand shortness of breath.  Most recent chest xray is showing chronic emphysematous changes with developing infiltrates, worse on the left, consistent with PNA.   Type of Study: Bedside Swallow Evaluation Previous Swallow Assessment: None noted.   Diet Prior to this Study: NPO Temperature Spikes Noted: No Respiratory Status: Non-rebreather History of Recent Intubation: No Behavior/Cognition: Cooperative;Requires cueing;Alert Oral Cavity Assessment: Dry Oral Care Completed by SLP: No Self-Feeding Abilities: Total assist Patient Positioning: Upright in bed Baseline Vocal Quality: Low vocal intensity Volitional Cough: Weak Volitional Swallow: Able to elicit    Oral/Motor/Sensory Function Overall Oral Motor/Sensory Function:  (Limited assessment due to issues following commands.  )   Ice Chips Ice chips: Impaired Presentation: Spoon Oral Phase Impairments: Impaired mastication Oral Phase Functional Implications: Prolonged oral transit Pharyngeal Phase Impairments: Suspected delayed Swallow;Multiple swallows   Thin Liquid Thin Liquid: Impaired Presentation: Spoon Pharyngeal  Phase Impairments: Suspected delayed Swallow;Multiple swallows    Nectar Thick Nectar Thick Liquid: Not tested   Honey Thick Honey Thick Liquid: Not tested   Puree Puree: Impaired Presentation: Spoon Oral Phase Impairments: Impaired mastication Oral Phase Functional Implications: Prolonged oral transit Pharyngeal Phase Impairments: Suspected delayed Swallow;Multiple swallows   Solid   GO   Solid: Not tested       Shelly Flatten, MA, CCC-SLP Acute Rehab SLP [id number]  Shelly Flatten N 08/26/2016,9:08 AM

## 2016-08-09 DIAGNOSIS — N179 Acute kidney failure, unspecified: Secondary | ICD-10-CM

## 2016-08-09 NOTE — Progress Notes (Signed)
PROGRESS NOTE        PATIENT DETAILS Name: Sean [name] Age: 83 y.o. Sex: male Date of Birth: 1925-06-25 Admit Date: 08/12/2016 Admitting Physician Ivor Costa, MD [id number] Jenny Reichmann, MD  Brief Narrative: Patient is a 83 y.o. male with past medical history of COPD, atrial fibrillation not on anticoagulation due to GI bleeding, liver cirrhosis, currently a resident of a skilled nursing facility-admitted for acute hypoxemic respiratory failure and septic shock due to healthcare associated pneumonia. See below for further details  Subjective: Did not tolerate BiPAP yesterday-currently unresponsive-on 100% nonrebreather mask. Markedly worse than yesterday.   Assessment/Plan: Acute on chronic hypoxemic respiratory failure: Likely secondary to HCAP-on 100% nonrebreather mask this morning. Did not tolerate BiPAP yesterday. Seen by palliative care on 1/12-spoke with family at length at bedside-they are now okay with transition to full comfort measures. Family okay for comfort feeding-aware of risk of aspiration.  Septic shock due to healthcare associated pneumonia: Unfortunately in spite of IV antibiotics, she continued to deteriorate and become hypotensive. Multiple discussions held with family-DO NOT RESUSCITATE in place, and subsequently has been transitioned to full comfort measures. Suspect inpatient death in a few hours/days. Note, respiratory virus panel positive for RSV. Blood cultures negative so far.  Acute renal failure: Very little urine output overnight-approximately 1 50 mL-suspect that due to hypotension he is progressing into acute tubular necrosis. Since comfort care, no role in checking a renal panel.   Acute metabolic encephalopathy: Due to hypoxia, hypotension. Completely unresponsive this morning.  A. fib with RVR: Heart rate in the low 100s, unable to use beta blocker or Cardizem and due to hypotension. Not a anticoagulation candidate. Chads2vasc score  3  GERD: No longer on PPI-comfort measures.  COPD: No rhonchi or-I suspect his acute respiratory failure is from pneumonia rather than COPD exacerbation.   History of chronic hypoxemic respiratory failure: Likely secondary to COPD, on home O2-2.5 L/m-currently on 100% nonrebreather mask  Left heel/left lower extremity wounds: All of these ulcers were present prior to admission-comfort measures in place. Seen by wound care on 1/12.  Severe protein calorie malnutrition: No role for supplements-comfort measures and is  Recent left tibia/fibular fracture: Followed by Dr Erlinda Hong in the outpatient setting.  History of alcoholic liver cirrhosis  Goals of care/palliative care: Long discussion with spouse and son and other family members of the past few days. Unfortunately patient is very cachectic, malnourished and seems to be actively dying. DO NOT RESUSCITATE in place, after multiple discussions with this M.D., and with the palliative care team-family now agreeable with full comfort measures. Suspect inpatient death in the next few hours/days.    DVT Prophylaxis: Prophylactic Lovenox   Code Status:  DNR  Family Communication: Son/Grand daughter at bedside  Disposition Plan: Remain inpatient-suspect in patient death over the next few hours/days  Antimicrobial agents: Anti-infectives    Start     Dose/Rate Route Frequency Ordered Stop   08/10/16 0400  levofloxacin (LEVAQUIN) IVPB 750 mg  Status:  Discontinued     750 mg 100 mL/hr over 90 Minutes Intravenous Every 48 hours 08/19/2016 0516 08/09/16 1050   08/09/16 0400  vancomycin (VANCOCIN) 500 mg in sodium chloride 0.9 % 100 mL IVPB  Status:  Discontinued     500 mg 100 mL/hr over 60 Minutes Intravenous Every 24 hours 08/10/2016 0516 08/15/2016 1002   08/14/2016  1000  aztreonam (AZACTAM) 1 g in dextrose 5 % 50 mL IVPB  Status:  Discontinued     1 g 100 mL/hr over 30 Minutes Intravenous Every 8 hours 08/17/2016 0516 08/24/2016 1002   08/20/2016 0130   levofloxacin (LEVAQUIN) IVPB 750 mg     750 mg 100 mL/hr over 90 Minutes Intravenous  Once 07/29/2016 0128 08/04/2016 0310   08/06/2016 0130  aztreonam (AZACTAM) 2 g in dextrose 5 % 50 mL IVPB     2 g 100 mL/hr over 30 Minutes Intravenous  Once 08/03/2016 0128 08/07/2016 0210   07/30/2016 0130  vancomycin (VANCOCIN) IVPB 1000 mg/200 mL premix     1,000 mg 200 mL/hr over 60 Minutes Intravenous  Once 08/21/2016 0128 08/24/2016 0240      Procedures: None  CONSULTS:  pulmonary/intensive care  Time spent: 35 minutes-Greater than 50% of this time was spent in counseling, explanation of diagnosis, planning of further management, and coordination of care.  MEDICATIONS: Scheduled Meds: . dextromethorphan-guaiFENesin  1 tablet Oral BID  . feeding supplement (ENSURE ENLIVE)  237 mL Oral BID BM  . feeding supplement (PRO-STAT SUGAR FREE 64)  30 mL Oral BID  . LORazepam  0.5 mg Intravenous BID  . mirtazapine  7.5 mg Oral QHS  . sodium chloride  500 mL Intravenous Once   Continuous Infusions: . sodium chloride 10 mL/hr at 08/25/2016 0800   PRN Meds:.acetaminophen, bisacodyl, glycopyrrolate, ipratropium, levalbuterol, LORazepam, magnesium hydroxide, morphine injection, ondansetron   PHYSICAL EXAM: Vital signs: Vitals:   08/26/2016 2000 08/09/16 0131 08/09/16 0435 08/09/16 0806  BP:  (!) 77/58  (!) 94/44  Pulse:  (!) 105  98  Resp:  16  (!) 32  Temp: 98.3 F (36.8 C)  97.9 F (36.6 C) 98.7 F (37.1 C)  TempSrc: Axillary  Axillary Axillary  SpO2:  97%  97%  Weight:      Height:       Filed Weights   08/17/2016 0125 08/10/2016 0654  Weight: 47.2 kg (104 lb) 43.9 kg (96 lb 12.5 oz)   Body mass index is 13.13 kg/m.   General appearance :Unresponsive-slightly thick Neck but otherwise appears comfortable Neck: supple Resp:Good air entry bilaterally, currently upper airway sounds CVS: S1 S2 irregular GI: Bowel sounds present, Non tender  I have personally reviewed following labs and imaging  studies  LABORATORY DATA: CBC:  Recent Labs Lab 08/04/2016 0110  WBC 3.1*  NEUTROABS 2.6  HGB 10.9*  HCT 32.8*  MCV 94.8  PLT 0000000    Basic Metabolic Panel:  Recent Labs Lab 08/10/2016 0110  NA 135  K 3.6  CL 100*  CO2 21*  GLUCOSE 137*  BUN 43*  CREATININE 1.41*  CALCIUM 9.2    GFR: Estimated Creatinine Clearance: 22.1 mL/min (by C-G formula based on SCr of 1.41 mg/dL (H)).  Liver Function Tests:  Recent Labs Lab 08/22/2016 0110  AST 50*  ALT 19  ALKPHOS 176*  BILITOT 0.9  PROT 6.0*  ALBUMIN 2.3*   No results for input(s): LIPASE, AMYLASE in the last 168 hours. No results for input(s): AMMONIA in the last 168 hours.  Coagulation Profile:  Recent Labs Lab 08/06/2016 0410  INR 1.47    Cardiac Enzymes: No results for input(s): CKTOTAL, CKMB, CKMBINDEX, TROPONINI in the last 168 hours.  BNP (last 3 results) No results for input(s): PROBNP in the last 8760 hours.  HbA1C: No results for input(s): HGBA1C in the last 72 hours.  CBG: No results for input(s):  GLUCAP in the last 168 hours.  Lipid Profile: No results for input(s): CHOL, HDL, LDLCALC, TRIG, CHOLHDL, LDLDIRECT in the last 72 hours.  Thyroid Function Tests: No results for input(s): TSH, T4TOTAL, FREET4, T3FREE, THYROIDAB in the last 72 hours.  Anemia Panel: No results for input(s): VITAMINB12, FOLATE, FERRITIN, TIBC, IRON, RETICCTPCT in the last 72 hours.  Urine analysis:    Component Value Date/Time   COLORURINE AMBER (A) 08/20/2016 0200   APPEARANCEUR HAZY (A) 08/24/2016 0200   LABSPEC 1.019 07/31/2016 0200   PHURINE 5.0 08/18/2016 0200   GLUCOSEU NEGATIVE 08/06/2016 0200   GLUCOSEU NEGATIVE 02/27/2016 1538   HGBUR NEGATIVE 08/26/2016 0200   BILIRUBINUR NEGATIVE 08/01/2016 0200   KETONESUR NEGATIVE 08/09/2016 0200   PROTEINUR NEGATIVE 08/14/2016 0200   UROBILINOGEN 1.0 02/27/2016 1538   NITRITE NEGATIVE 08/26/2016 0200   LEUKOCYTESUR SMALL (A) 08/17/2016 0200    Sepsis  Labs: Lactic Acid, Venous    Component Value Date/Time   LATICACIDVEN 5.6 ([place]) 08/25/2016 0634    MICROBIOLOGY: Recent Results (from the past 240 hour(s))  Blood Culture (routine x 2)     Status: None (Preliminary result)   Collection Time: 08/13/2016  1:10 AM  Result Value Ref Range Status   Specimen Description BLOOD LEFT ARM  Final   Special Requests BOTTLES DRAWN AEROBIC AND ANAEROBIC 5ML  Final   Culture NO GROWTH 1 DAY  Final   Report Status PENDING  Incomplete  Blood Culture (routine x 2)     Status: None (Preliminary result)   Collection Time: 08/12/2016  1:33 AM  Result Value Ref Range Status   Specimen Description BLOOD RIGHT ARM  Final   Special Requests BOTTLES DRAWN AEROBIC AND ANAEROBIC 5ML  Final   Culture NO GROWTH 1 DAY  Final   Report Status PENDING  Incomplete  Urine culture     Status: Abnormal (Preliminary result)   Collection Time: 07/29/2016  2:00 AM  Result Value Ref Range Status   Specimen Description URINE, RANDOM  Final   Special Requests NONE  Final   Culture (A)  Final    >=100,000 COLONIES/mL ENTEROCOCCUS FAECALIS SUSCEPTIBILITIES TO FOLLOW    Report Status PENDING  Incomplete  Respiratory Panel by PCR     Status: Abnormal   Collection Time: 08/26/2016  7:54 AM  Result Value Ref Range Status   Adenovirus NOT DETECTED NOT DETECTED Final   Coronavirus 229E NOT DETECTED NOT DETECTED Final   Coronavirus HKU1 NOT DETECTED NOT DETECTED Final   Coronavirus NL63 NOT DETECTED NOT DETECTED Final   Coronavirus OC43 NOT DETECTED NOT DETECTED Final   Metapneumovirus NOT DETECTED NOT DETECTED Final   Rhinovirus / Enterovirus NOT DETECTED NOT DETECTED Final   Influenza A NOT DETECTED NOT DETECTED Final   Influenza B NOT DETECTED NOT DETECTED Final   Parainfluenza Virus 1 NOT DETECTED NOT DETECTED Final   Parainfluenza Virus 2 NOT DETECTED NOT DETECTED Final   Parainfluenza Virus 3 NOT DETECTED NOT DETECTED Final   Parainfluenza Virus 4 NOT DETECTED NOT DETECTED  Final   Respiratory Syncytial Virus DETECTED (A) NOT DETECTED Final    Comment: CRITICAL RESULT CALLED TO, READ BACK BY AND VERIFIED WITH: A. Rosana Hoes RN 16:05 08/03/2016 ([email or web address])    Bordetella pertussis NOT DETECTED NOT DETECTED Final   Chlamydophila pneumoniae NOT DETECTED NOT DETECTED Final   Mycoplasma pneumoniae NOT DETECTED NOT DETECTED Final  MRSA PCR Screening     Status: None   Collection Time: 07/29/2016  7:57 AM  Result Value Ref Range Status   MRSA by PCR NEGATIVE NEGATIVE Final    Comment:        The GeneXpert MRSA Assay (FDA approved for NASAL specimens only), is one component of a comprehensive MRSA colonization surveillance program. It is not intended to diagnose MRSA infection nor to guide or monitor treatment for MRSA infections.     RADIOLOGY STUDIES/RESULTS: Dg Chest 2 View  Result Date: 07/24/2016 CLINICAL DATA:  Hypoxia. EXAM: CHEST  2 VIEW COMPARISON:  Radiographs of June 20, 2016. FINDINGS: Stable cardiomediastinal silhouette. Atherosclerosis of thoracic aorta is noted. No pneumothorax or pleural effusion is noted. Hyperexpansion of the lungs is noted. Old left clavicular fracture is noted. IMPRESSION: No active cardiopulmonary disease. Hyperexpansion of the lungs suggesting chronic obstructive pulmonary disease. Aortic atherosclerosis. Electronically Signed   By: Marijo Conception, M.D.   On: 07/24/2016 10:13   Ct Angio Chest Pe W And/or Wo Contrast  Result Date: 07/24/2016 CLINICAL DATA:  Hypoxemia, left rib and chest pain EXAM: CT ANGIOGRAPHY CHEST WITH CONTRAST TECHNIQUE: Multidetector CT imaging of the chest was performed using the standard protocol during bolus administration of intravenous contrast. Multiplanar CT image reconstructions and MIPs were obtained to evaluate the vascular anatomy. CONTRAST:  85 mL Isovue 370 COMPARISON:  None. FINDINGS: Cardiovascular: Satisfactory opacification of the pulmonary arteries to the segmental level. No evidence of  pulmonary embolism. Normal heart size. No pericardial effusion. Abdominal aortic atherosclerosis. Mediastinum/Nodes: No enlarged mediastinal, hilar, or axillary lymph nodes. Thyroid gland, trachea, and esophagus demonstrate no significant findings. Lungs/Pleura: Bilateral centrilobular emphysema. Biapical scarring. Bilateral lower lobe airspace disease dependently which may reflect atelectasis versus pneumonia including aspiration pneumonia. No pleural effusion or pneumothorax. Upper Abdomen: No acute abnormality. Hypodense, fluid attenuating 2.9 cm left renal cyst. Musculoskeletal: No acute osseous abnormality. Old L2 vertebral body compression fracture status post augmentation. Review of the MIP images confirms the above findings. IMPRESSION: 1. No evidence of pulmonary embolus. 2. Bilateral lower lobe airspace disease dependently which may reflect atelectasis versus pneumonia including aspiration pneumonia. 3.  Emphysema. [id number]) Electronically Signed   By: Kathreen Devoid   On: 07/24/2016 14:21   Dg Chest Port 1 View  Result Date: 08/18/2016 CLINICAL DATA:  Shortness of breath. Decreased oxygen saturations. Tachycardia. Fever. EXAM: PORTABLE CHEST 1 VIEW COMPARISON:  07/24/2016 FINDINGS: Cardiac enlargement without vascular congestion. Emphysematous changes in the lungs. New left perihilar and basilar airspace infiltration likely representing pneumonia. Patchy infiltrates also suggested in the right hilar and basilar region but less prominent. No pleural effusions. No pneumothorax. Calcified and tortuous aorta. IMPRESSION: Chronic emphysematous changes in the lungs with developing infiltrates, more prominent on the left, suggesting pneumonia. Cardiac enlargement without vascular congestion. Electronically Signed   By: Lucienne Capers M.D.   On: 08/01/2016 01:43     LOS: 1 day   Oren Binet, MD  [facility] Pager:[phone]  If 7PM-7AM, please contact  night-coverage [email or web address] Password [facility] 08/09/2016, 10:51 AM

## 2016-08-10 DIAGNOSIS — Z515 Encounter for palliative care: Secondary | ICD-10-CM

## 2016-08-10 LAB — URINE CULTURE

## 2016-08-10 MED ORDER — MORPHINE SULFATE (PF) 4 MG/ML IV SOLN: 5.0000 mg | INTRAVENOUS | Status: DC | PRN

## 2016-08-10 NOTE — Progress Notes (Signed)
PROGRESS NOTE        PATIENT DETAILS Name: Sean [name] Age: 83 y.o. Sex: male Date of Birth: August 20, 1924 Admit Date: 08/27/2016 Admitting Physician Ivor Costa, MD [id number] Jenny Reichmann, MD  Brief Narrative: Patient is a 83 y.o. male with past medical history of COPD, atrial fibrillation not on anticoagulation due to GI bleeding, liver cirrhosis, currently a resident of a skilled nursing facility-admitted for acute hypoxemic respiratory failure and septic shock due to healthcare associated pneumonia. See below for further details  Subjective: Unresponsive-on 100% nonrebreather mask.   Assessment/Plan: Acute on chronic hypoxemic respiratory failure: Likely secondary to HCAP-on 100% nonrebreather mask this morning. Seen by palliative care on 1/12-spoke with family at length at bedside- transitioned to full comfort measures. Family okay for comfort feeding-aware of risk of aspiration.  Septic shock due to healthcare associated pneumonia: Unfortunately in spite of IV antibiotics, he continued to deteriorate and become hypotensive. Multiple discussions held with family-DO NOT RESUSCITATE in place, and subsequently has been transitioned to full comfort measures. Suspect inpatient death in a few hours/days. Note, respiratory virus panel positive for RSV. Blood cultures negative so far.  Acute renal failure:Oliguric Very little urine output overnight-suspect that due to hypotension patient is now in  acute tubular necrosis. Since comfort care, no role in checking a renal panel.   Acute metabolic encephalopathy: Due to hypoxia, hypotension. Remains unresponsive this morning.  A. fib with RVR: Heart rate in the low 100s, unable to use beta blocker or Cardizem and due to hypotension. Not a anticoagulation candidate. Chads2vasc score 3  GERD: No longer on PPI-comfort measures.  COPD: No rhonchi or-I suspect his acute respiratory failure is from pneumonia rather than COPD  exacerbation.   History of chronic hypoxemic respiratory failure: Likely secondary to COPD, on home O2-2.5 L/m-currently on 100% nonrebreather mask  Left heel/left lower extremity wounds: All of these ulcers were present prior to admission-comfort measures in place. Seen by wound care on 1/12.  Severe protein calorie malnutrition: No role for supplements-comfort measures and is  Recent left tibia/fibular fracture: Followed by Dr Erlinda Hong in the outpatient setting.  History of alcoholic liver cirrhosis  Goals of care/palliative care: Long discussion with spouse and son and other family members of the past few days. Unfortunately patient is very cachectic, malnourished and seems to be actively dying. DO NOT RESUSCITATE in place, after multiple discussions with this M.D., and with the palliative care team-family now agreeable with full comfort measures. Suspect inpatient death in the next few hours/days.    DVT Prophylaxis: Prophylactic Lovenox   Code Status:  DNR  Family Communication: Son at bedside  Disposition Plan: Remain inpatient-suspect in patient death over the next few hours/days  Antimicrobial agents: Anti-infectives    Start     Dose/Rate Route Frequency Ordered Stop   08/10/16 0400  levofloxacin (LEVAQUIN) IVPB 750 mg  Status:  Discontinued     750 mg 100 mL/hr over 90 Minutes Intravenous Every 48 hours 08/19/2016 0516 08/09/16 1050   08/09/16 0400  vancomycin (VANCOCIN) 500 mg in sodium chloride 0.9 % 100 mL IVPB  Status:  Discontinued     500 mg 100 mL/hr over 60 Minutes Intravenous Every 24 hours 08/18/2016 0516 08/02/2016 1002   08/03/2016 1000  aztreonam (AZACTAM) 1 g in dextrose 5 % 50 mL IVPB  Status:  Discontinued  1 g 100 mL/hr over 30 Minutes Intravenous Every 8 hours 08/04/2016 0516 08/15/2016 1002   08/01/2016 0130  levofloxacin (LEVAQUIN) IVPB 750 mg     750 mg 100 mL/hr over 90 Minutes Intravenous  Once 07/30/2016 0128 08/07/2016 0310   08/01/2016 0130  aztreonam (AZACTAM) 2  g in dextrose 5 % 50 mL IVPB     2 g 100 mL/hr over 30 Minutes Intravenous  Once 08/16/2016 0128 08/19/2016 0210   08/27/2016 0130  vancomycin (VANCOCIN) IVPB 1000 mg/200 mL premix     1,000 mg 200 mL/hr over 60 Minutes Intravenous  Once 08/13/2016 0128 08/04/2016 0240      Procedures: None  CONSULTS:  pulmonary/intensive care  Time spent: 35 minutes-Greater than 50% of this time was spent in counseling, explanation of diagnosis, planning of further management, and coordination of care.  MEDICATIONS: Scheduled Meds: . dextromethorphan-guaiFENesin  1 tablet Oral BID  . feeding supplement (PRO-STAT SUGAR FREE 64)  30 mL Oral BID  . LORazepam  0.5 mg Intravenous BID  . sodium chloride  500 mL Intravenous Once   Continuous Infusions: . sodium chloride 20 mL/hr at 08/09/16 1201   PRN Meds:.acetaminophen, bisacodyl, glycopyrrolate, ipratropium, levalbuterol, LORazepam, magnesium hydroxide, morphine injection, morphine injection, ondansetron   PHYSICAL EXAM: Vital signs: Vitals:   08/13/2016 2000 08/09/16 0131 08/09/16 0435 08/09/16 0806  BP:  (!) 77/58  (!) 94/44  Pulse:  (!) 105  98  Resp:  16  (!) 32  Temp: 98.3 F (36.8 C)  97.9 F (36.6 C) 98.7 F (37.1 C)  TempSrc: Axillary  Axillary Axillary  SpO2:  97%  97%  Weight:      Height:       Filed Weights   08/22/2016 0125 08/12/2016 0654  Weight: 47.2 kg (104 lb) 43.9 kg (96 lb 12.5 oz)   Body mass index is 13.13 kg/m.   General appearance :Unresponsive- otherwise appears comfortable Neck: supple Resp:Good air entry bilaterally, transmitted upper airway sounds CVS: S1 S2 irregular GI: Bowel sounds present, Non tender  I have personally reviewed following labs and imaging studies  LABORATORY DATA: CBC:  Recent Labs Lab 07/30/2016 0110  WBC 3.1*  NEUTROABS 2.6  HGB 10.9*  HCT 32.8*  MCV 94.8  PLT 0000000    Basic Metabolic Panel:  Recent Labs Lab 08/04/2016 0110  NA 135  K 3.6  CL 100*  CO2 21*  GLUCOSE 137*    BUN 43*  CREATININE 1.41*  CALCIUM 9.2    GFR: Estimated Creatinine Clearance: 22.1 mL/min (by C-G formula based on SCr of 1.41 mg/dL (H)).  Liver Function Tests:  Recent Labs Lab 08/13/2016 0110  AST 50*  ALT 19  ALKPHOS 176*  BILITOT 0.9  PROT 6.0*  ALBUMIN 2.3*   No results for input(s): LIPASE, AMYLASE in the last 168 hours. No results for input(s): AMMONIA in the last 168 hours.  Coagulation Profile:  Recent Labs Lab 08/07/2016 0410  INR 1.47    Cardiac Enzymes: No results for input(s): CKTOTAL, CKMB, CKMBINDEX, TROPONINI in the last 168 hours.  BNP (last 3 results) No results for input(s): PROBNP in the last 8760 hours.  HbA1C: No results for input(s): HGBA1C in the last 72 hours.  CBG: No results for input(s): GLUCAP in the last 168 hours.  Lipid Profile: No results for input(s): CHOL, HDL, LDLCALC, TRIG, CHOLHDL, LDLDIRECT in the last 72 hours.  Thyroid Function Tests: No results for input(s): TSH, T4TOTAL, FREET4, T3FREE, THYROIDAB in the last 72  hours.  Anemia Panel: No results for input(s): VITAMINB12, FOLATE, FERRITIN, TIBC, IRON, RETICCTPCT in the last 72 hours.  Urine analysis:    Component Value Date/Time   COLORURINE AMBER (A) 07/31/2016 0200   APPEARANCEUR HAZY (A) 08/18/2016 0200   LABSPEC 1.019 08/19/2016 0200   PHURINE 5.0 08/09/2016 0200   GLUCOSEU NEGATIVE 08/23/2016 0200   GLUCOSEU NEGATIVE 02/27/2016 1538   HGBUR NEGATIVE 08/07/2016 0200   BILIRUBINUR NEGATIVE 08/03/2016 0200   KETONESUR NEGATIVE 08/18/2016 0200   PROTEINUR NEGATIVE 07/28/2016 0200   UROBILINOGEN 1.0 02/27/2016 1538   NITRITE NEGATIVE 08/04/2016 0200   LEUKOCYTESUR SMALL (A) 08/09/2016 0200    Sepsis Labs: Lactic Acid, Venous    Component Value Date/Time   LATICACIDVEN 5.6 ([place]) 08/15/2016 0634    MICROBIOLOGY: Recent Results (from the past 240 hour(s))  Blood Culture (routine x 2)     Status: None (Preliminary result)   Collection Time: 07/31/2016   1:10 AM  Result Value Ref Range Status   Specimen Description BLOOD LEFT ARM  Final   Special Requests BOTTLES DRAWN AEROBIC AND ANAEROBIC 5ML  Final   Culture NO GROWTH 2 DAYS  Final   Report Status PENDING  Incomplete  Blood Culture (routine x 2)     Status: None (Preliminary result)   Collection Time: 08/14/2016  1:33 AM  Result Value Ref Range Status   Specimen Description BLOOD RIGHT ARM  Final   Special Requests BOTTLES DRAWN AEROBIC AND ANAEROBIC 5ML  Final   Culture NO GROWTH 2 DAYS  Final   Report Status PENDING  Incomplete  Urine culture     Status: Abnormal   Collection Time: 08/06/2016  2:00 AM  Result Value Ref Range Status   Specimen Description URINE, RANDOM  Final   Special Requests NONE  Final   Culture >=100,000 COLONIES/mL ENTEROCOCCUS FAECALIS (A)  Final   Report Status 08/10/2016 FINAL  Final   Organism ID, Bacteria ENTEROCOCCUS FAECALIS (A)  Final      Susceptibility   Enterococcus faecalis - MIC*    AMPICILLIN <=2 SENSITIVE Sensitive     LEVOFLOXACIN >=8 RESISTANT Resistant     NITROFURANTOIN <=16 SENSITIVE Sensitive     VANCOMYCIN 1 SENSITIVE Sensitive     * >=100,000 COLONIES/mL ENTEROCOCCUS FAECALIS  Respiratory Panel by PCR     Status: Abnormal   Collection Time: 08/07/2016  7:54 AM  Result Value Ref Range Status   Adenovirus NOT DETECTED NOT DETECTED Final   Coronavirus 229E NOT DETECTED NOT DETECTED Final   Coronavirus HKU1 NOT DETECTED NOT DETECTED Final   Coronavirus NL63 NOT DETECTED NOT DETECTED Final   Coronavirus OC43 NOT DETECTED NOT DETECTED Final   Metapneumovirus NOT DETECTED NOT DETECTED Final   Rhinovirus / Enterovirus NOT DETECTED NOT DETECTED Final   Influenza A NOT DETECTED NOT DETECTED Final   Influenza B NOT DETECTED NOT DETECTED Final   Parainfluenza Virus 1 NOT DETECTED NOT DETECTED Final   Parainfluenza Virus 2 NOT DETECTED NOT DETECTED Final   Parainfluenza Virus 3 NOT DETECTED NOT DETECTED Final   Parainfluenza Virus 4 NOT  DETECTED NOT DETECTED Final   Respiratory Syncytial Virus DETECTED (A) NOT DETECTED Final    Comment: CRITICAL RESULT CALLED TO, READ BACK BY AND VERIFIED WITH: A. Rosana Hoes RN 16:05 08/16/2016 ([email or web address])    Bordetella pertussis NOT DETECTED NOT DETECTED Final   Chlamydophila pneumoniae NOT DETECTED NOT DETECTED Final   Mycoplasma pneumoniae NOT DETECTED NOT DETECTED Final  MRSA PCR Screening  Status: None   Collection Time: 08/05/2016  7:57 AM  Result Value Ref Range Status   MRSA by PCR NEGATIVE NEGATIVE Final    Comment:        The GeneXpert MRSA Assay (FDA approved for NASAL specimens only), is one component of a comprehensive MRSA colonization surveillance program. It is not intended to diagnose MRSA infection nor to guide or monitor treatment for MRSA infections.     RADIOLOGY STUDIES/RESULTS: Dg Chest 2 View  Result Date: 07/24/2016 CLINICAL DATA:  Hypoxia. EXAM: CHEST  2 VIEW COMPARISON:  Radiographs of June 20, 2016. FINDINGS: Stable cardiomediastinal silhouette. Atherosclerosis of thoracic aorta is noted. No pneumothorax or pleural effusion is noted. Hyperexpansion of the lungs is noted. Old left clavicular fracture is noted. IMPRESSION: No active cardiopulmonary disease. Hyperexpansion of the lungs suggesting chronic obstructive pulmonary disease. Aortic atherosclerosis. Electronically Signed   By: Marijo Conception, M.D.   On: 07/24/2016 10:13   Ct Angio Chest Pe W And/or Wo Contrast  Result Date: 07/24/2016 CLINICAL DATA:  Hypoxemia, left rib and chest pain EXAM: CT ANGIOGRAPHY CHEST WITH CONTRAST TECHNIQUE: Multidetector CT imaging of the chest was performed using the standard protocol during bolus administration of intravenous contrast. Multiplanar CT image reconstructions and MIPs were obtained to evaluate the vascular anatomy. CONTRAST:  85 mL Isovue 370 COMPARISON:  None. FINDINGS: Cardiovascular: Satisfactory opacification of the pulmonary arteries to the segmental  level. No evidence of pulmonary embolism. Normal heart size. No pericardial effusion. Abdominal aortic atherosclerosis. Mediastinum/Nodes: No enlarged mediastinal, hilar, or axillary lymph nodes. Thyroid gland, trachea, and esophagus demonstrate no significant findings. Lungs/Pleura: Bilateral centrilobular emphysema. Biapical scarring. Bilateral lower lobe airspace disease dependently which may reflect atelectasis versus pneumonia including aspiration pneumonia. No pleural effusion or pneumothorax. Upper Abdomen: No acute abnormality. Hypodense, fluid attenuating 2.9 cm left renal cyst. Musculoskeletal: No acute osseous abnormality. Old L2 vertebral body compression fracture status post augmentation. Review of the MIP images confirms the above findings. IMPRESSION: 1. No evidence of pulmonary embolus. 2. Bilateral lower lobe airspace disease dependently which may reflect atelectasis versus pneumonia including aspiration pneumonia. 3.  Emphysema. [id number]) Electronically Signed   By: Kathreen Devoid   On: 07/24/2016 14:21   Dg Chest Port 1 View  Result Date: 08/04/2016 CLINICAL DATA:  Shortness of breath. Decreased oxygen saturations. Tachycardia. Fever. EXAM: PORTABLE CHEST 1 VIEW COMPARISON:  07/24/2016 FINDINGS: Cardiac enlargement without vascular congestion. Emphysematous changes in the lungs. New left perihilar and basilar airspace infiltration likely representing pneumonia. Patchy infiltrates also suggested in the right hilar and basilar region but less prominent. No pleural effusions. No pneumothorax. Calcified and tortuous aorta. IMPRESSION: Chronic emphysematous changes in the lungs with developing infiltrates, more prominent on the left, suggesting pneumonia. Cardiac enlargement without vascular congestion. Electronically Signed   By: Lucienne Capers M.D.   On: 08/13/2016 01:43     LOS: 2 days   Oren Binet, MD  [facility] Pager:[phone]  If 7PM-7AM, please contact  night-coverage [email or web address] Password [email or web address] 08/10/2016, 2:16 PM

## 2016-08-10 NOTE — Progress Notes (Signed)
Daily Progress Note   Patient Name: Sean [name]       Date: 08/10/2016 DOB: [birth year]  Age: 83 y.o. MRN#: [id number] Attending Physician: Jonetta Osgood, MD Primary Care Physician: Cathlean Cower, MD Admit Date: 08/09/2016  Reason for Consultation/Follow-up: Establishing goals of care and Terminal Care  Subjective: Met with patient and son, Harrie Jeans at bedside. Patient is unresponsive and cachetic but comfortable. No signs or symptoms of pain or distress. Respirations regular, non-labored. Currently on non-rebreather mask and with 100% saturation.   Initial palliative consult on 1/12. Patient has since declined. Discussed comfort measures, symptom management, and expectations at EOL with son. Discussed that the non-rebreather mask may be prolonging the dying process. Harrie Jeans understands but would like to keep the mask in place and that more family is coming to the hospital today. Answered questions and provided support. Harrie Jeans has my contact information and encouraged to call with questions or concerns.   Length of Stay: 2  Current Medications: Scheduled Meds:  . dextromethorphan-guaiFENesin  1 tablet Oral BID  . feeding supplement (PRO-STAT SUGAR FREE 64)  30 mL Oral BID  . LORazepam  0.5 mg Intravenous BID  . sodium chloride  500 mL Intravenous Once    Continuous Infusions: . sodium chloride 20 mL/hr at 08/09/16 1201    PRN Meds: acetaminophen, bisacodyl, glycopyrrolate, ipratropium, levalbuterol, LORazepam, magnesium hydroxide, morphine injection, morphine injection, ondansetron  Physical Exam  Constitutional: He appears ill.  Cardiovascular: Exam reveals distant heart sounds.   Pulmonary/Chest: No accessory muscle usage. No respiratory distress. He has rhonchi.  Abdominal: Normal  appearance.  Neurological: He is unresponsive.  Skin: Skin is warm and dry. There is pallor.  BLE mottling  Nursing note and vitals reviewed.          Vital Signs: BP (!) 94/44 (BP Location: Right Arm)   Pulse 98   Temp 98.7 F (37.1 C) (Axillary)   Resp (!) 32   Ht 6' (1.829 m)   Wt 43.9 kg (96 lb 12.5 oz)   SpO2 97%   BMI 13.13 kg/m  SpO2: SpO2: 97 % O2 Device: O2 Device: NRB O2 Flow Rate: O2 Flow Rate (L/min): 15 L/min  Intake/output summary: No intake or output data in the 24 hours ending 08/10/16 1021 LBM: Last BM Date: 08/03/2016 Baseline Weight: Weight: 47.2  kg (104 lb) Most recent weight: Weight: 43.9 kg (96 lb 12.5 oz)       Palliative Assessment/Data: PPS 10%   Flowsheet Rows   Flowsheet Row Most Recent Value  Intake Tab  Referral Department  Hospitalist  Unit at Time of Referral  Intermediate Care Unit  Palliative Care Primary Diagnosis  Pulmonary  Date Notified  08/05/2016  Palliative Care Type  New Palliative care  Reason for referral  Clarify Goals of Care  Date of Admission  08/15/2016  Date first seen by Palliative Care  07/28/2016  # of days Palliative referral response time  0 Day(s)  # of days IP prior to Palliative referral  0  Clinical Assessment  Psychosocial & Spiritual Assessment  Palliative Care Outcomes      Patient Active Problem List   Diagnosis Date Noted  . AKI (acute kidney injury) ([place]) 08/17/2016  . HCAP (healthcare-associated pneumonia) 08/05/2016  . Acute on chronic respiratory failure with hypoxia ([place]) 08/01/2016  . Sepsis ([place]) 08/16/2016  . Acute encephalopathy 08/04/2016  . Septic shock ([place]) 08/07/2016  . Palliative care encounter   . Goals of care, counseling/discussion   . Terminal care   . Hypoxemia 07/29/2016  . Pressure injury of skin 07/05/2016  . Lower GI bleed 07/04/2016  . History of open reduction and internal fixation (ORIF) procedure   . Anemia of chronic disease 06/21/2016  . Chronic anticoagulation  06/21/2016  . Closed tibia fracture 06/20/2016  . Closed tibial fracture 06/20/2016  . Cardiomyopathy ([place]) 03/11/2016  . Aortic regurgitation 03/11/2016  . Atrial fibrillation with RVR ([place]) 02/27/2016  . Chest pain 08/09/2014  . Back pain 02/01/2013  . Grief reaction 02/01/2013  . Chronic pain 01/30/2012  . Bladder neck obstruction 12/25/2011  . Weight loss 10/10/2011  . Ecchymosis 06/18/2011  . Peripheral neuropathy ([place]) 12/16/2010  . Impaired glucose tolerance 12/13/2010  . Preventative health care 12/13/2010  . LOW BACK PAIN, CHRONIC 12/17/2009  . Constipation 08/09/2009  . COMPRESSION FRACTURE, LUMBAR VERTEBRAE 08/09/2009  . CAROTID ARTERY DISEASE 07/23/2009  . OSTEOARTHRITIS, KNEES, BILATERAL 06/19/2009  . UNSPECIFIED SUDDEN HEARING LOSS 03/20/2009  . VITAMIN D DEFICIENCY 09/18/2008  . COPD (chronic obstructive pulmonary disease) ([place]) 09/18/2008  . CKD (chronic kidney disease) 09/18/2008  . PSA, INCREASED 09/18/2008  . ECHOCARDIOGRAM, ABNORMAL 07/06/2008  . BRADYCARDIA 12/17/2007  . Fatigue 06/18/2007  . BLADDER CANCER 06/17/2007  . Hyperlipemia 06/17/2007  . ABUSE, ALCOHOL, IN REMISSION 06/17/2007  . Essential hypertension 06/17/2007  . GERD 06/17/2007  . PEPTIC ULCER DISEASE 06/17/2007  . DIVERTICULOSIS, COLON 06/17/2007  . Alcoholic cirrhosis of liver ([place]) 06/17/2007  . OSTEOPOROSIS 06/17/2007  . COLONIC POLYPS, HX OF 06/17/2007    Palliative Care Assessment & Plan   Patient Profile: 83 y.o. male  with past medical history of ETOH cirrhosis, mitral and aortic valve regurg, severe protein calorie malnutrition, atrial fibrillation not on anticoagulation secondary to GIB in December who was admitted on 08/05/2016 with septic shock due to HCAP.   Assessment: Acute on chronic hypoxemic respiratory failure Septic shock  HCAP Acute renal failure Acute metabolic encephalopathy Afib RVR COPD Severe protein calorie  malnutrition  Recommendations/Plan:  DNR/DNI  Comfort measures only  Symptom management per attending  Discussed with son removing NRB mask due to prolonging the dying process. He would like to keep the mask on at this time. Updated RN--if family decides to transition from NRB to nasal cannula, encouraged RN to give prn morphine prior to removal of mask.  PMT will continue to shadow chart and further discussion hospice options with family if necessary.   Anticipate hospital death.   Goals of Care and Additional Recommendations:  Limitations on Scope of Treatment: Full Comfort Care  Code Status: DNR   Code Status Orders        Start     Ordered   08/03/2016 0432  Do not attempt resuscitation (DNR)  Continuous     07/30/2016 0431    Code Status History    Date Active Date Inactive Code Status Order ID Comments User Context   08/21/2016  3:34 AM 08/03/2016  4:31 AM Full Code [id number]  Ivor Costa, MD ED   07/04/2016  7:56 PM 07/07/2016  4:52 PM Full Code [id number]  Albertine Patricia, MD Inpatient   06/20/2016 10:34 PM 06/25/2016  7:36 PM Full Code [id number]  Norval Morton, MD ED       Prognosis:   Hours - Days  Discharge Planning:  Anticipated Hospital Death  Care plan was discussed with son and RN  Thank you for allowing the Palliative Medicine Team to assist in the care of this patient.   Time In: 0955 Time Out: 1030 Total Time 21mn Prolonged Time Billed  no       Greater than 50%  of this time was spent counseling and coordinating care related to the above assessment and plan.  MIhor Dow FNP-C Palliative Medicine Team  Phone: [phone]: [phone] Please contact Palliative Medicine Team phone at [phone] questions and concerns.

## 2016-08-11 MED ORDER — GLYCOPYRROLATE 0.2 MG/ML IJ SOLN
0.4000 mg | Freq: Four times a day (QID) | INTRAMUSCULAR | Status: DC
  Administered 2016-08-11: 0.4 mg via INTRAVENOUS
  Filled 2016-08-11: qty 2

## 2016-08-11 MED ORDER — MORPHINE SULFATE (PF) 4 MG/ML IV SOLN
4.0000 mg | INTRAVENOUS | Status: DC | PRN
  Administered 2016-08-11: 4 mg via INTRAVENOUS
  Filled 2016-08-11 (×2): qty 1

## 2016-08-13 ENCOUNTER — Ambulatory Visit: Payer: Medicare Other | Admitting: Physician Assistant

## 2016-08-13 LAB — CULTURE, BLOOD (ROUTINE X 2)
CULTURE: NO GROWTH
Culture: NO GROWTH

## 2016-08-21 ENCOUNTER — Ambulatory Visit (INDEPENDENT_AMBULATORY_CARE_PROVIDER_SITE_OTHER): Payer: Medicare Other | Admitting: Orthopaedic Surgery

## 2016-08-28 NOTE — Progress Notes (Signed)
Pt expired at 1852 hrs pronounced by 2 RNs. Death was expected and pt was comfort care/DNR. Death certificate completed and given to unit secretary.  KJKG, NP [facility]

## 2016-08-28 NOTE — Discharge Summary (Signed)
Death Summary  Sean [name] [id number] DOB: 1925-03-13 DOA: Aug 18, 2016  PCP: Cathlean Cower, MD  Admit date: 08-18-16 Date of Death: 08/21/2016  Final Diagnoses:  Principal Problem:   HCAP (healthcare-associated pneumonia) Active Problems:   ABUSE, ALCOHOL, IN REMISSION   COPD (chronic obstructive pulmonary disease) (H[facility])   GERD   Atrial fibrillation with RVR ([place])   AKI (acute kidney injury) ([place])   Acute on chronic respiratory failure with hypoxia (H[facility])   Sepsis ([place])   Acute encephalopathy   Septic shock ([place])   Palliative care encounter   Goals of care, counseling/discussion   Terminal care   Comfort measures only status   History of present illness:  Patient is a 83 y.o. male with past medical history of COPD, atrial fibrillation not on anticoagulation due to GI bleeding, liver cirrhosis, a resident of a skilled nursing facility-admitted for acute hypoxemic respiratory failure and septic shock due to healthcare associated pneumonia. See below for further details  Hospital Course:  Acute on chronic hypoxemic respiratory failure: Likely secondary to HCAP-reqiored 100% nonrebreather mask-while on full comfort measures. Patient subsequently expired on 21-Aug-2016.  Septic shock due to healthcare associated pneumonia: Unfortunately in spite of IV antibiotics, he continued to deteriorate and become hypotensive. After extensive discussion with family, transitioned to full comfort measures.  Patient subsequently expired on 2016/08/21. Blood cultures negative.  Acute renal failure:Oliguric-suspect that due to hypotension patient likely developed acute tubular necrosis. Since comfort care, did not check any further labs.   Acute metabolic encephalopathy: Due to hypoxia, hypotension. Remained unresponsive during the most part of his hospital stay  A. fib with RVR: Heart ratewas  in the low 100s, unable to use beta blocker or Cardizem and due to hypotension. Not a anticoagulation  candidate. Chads2vasc score 3  GERD: No longer on PPI-comfort measures.  COPD: No rhonchi or-Suspect his acute respiratory failure is from pneumonia rather than COPD exacerbation.   History of chronic hypoxemic respiratory failure: Likely secondary to COPD, on home O2-2.5 L/m-was on 100% nonrebreather mask  Left heel/left lower extremity wounds: All of these ulcers were present prior to admission-comfort measures in place. Seen by wound care on 2022-08-18.  Severe protein calorie malnutrition: No role for supplements-comfort measures was in full effect  Recent left tibia/fibular fracture: Followed by Dr Erlinda Hong in the outpatient setting.  History of alcoholic liver cirrhosis  Signed:  Oren Binet  [facility] 08/20/2016, 8:43 PM

## 2016-08-28 NOTE — Progress Notes (Signed)
Spoke with RN regarding sudden increase in HR. Family insisted on monitor. Shortly after jump in HR patient ceased breathing. Idioventricular rhythm noted on monitor. Family aware death occurring. No physician needs at bedside.  Lane Hacker, DO Palliative Medicine  No Charge

## 2016-08-28 NOTE — Care Management Note (Signed)
Case Management Note  Patient Details  Name: Sean [name] MRN: [id number] Date of Birth: 1924-08-20  Subjective/Objective:    Patient expired.                Action/Plan:   Expected Discharge Date:                  Expected Discharge Plan:  Expired  In-House Referral:     Discharge planning Services  CM Consult  Post Acute Care Choice:    Choice offered to:     DME Arranged:    DME Agency:     HH Arranged:    [place] Agency:     Status of Service:  Completed, signed off  If discussed at [employer] of Stay Meetings, dates discussed:    Additional Comments:  Zenon Mayo, RN 08/12/2016, 12:18 PM

## 2016-08-28 NOTE — Progress Notes (Signed)
SLP Cancellation Note  Patient Details Name: Sean [name] MRN: [id number] DOB: [birth year]   Cancelled treatment:       Reason Eval/Treat Not Completed: Medical issues which prohibited therapy. Patient is full comfort care. Per nursing pt is unresponsive, family with no further questions. SLP will sign off.  Deneise Lever, [place] CF-SLP Speech-Language Pathologist [phone]  Aliene Altes 01-Sep-2016, 12:29 PM

## 2016-08-28 NOTE — Progress Notes (Signed)
Palliative Medicine RN Note: Long meeting with wife and grandson. Life review with wife. Discussed meds/treatment plan. RR 30s. Spoke with Dr Hilma Favors, who adjusted medications. Plan f/u by PMT tomorrow.  Marjie Skiff Taden Witter, RN, BSN, [facility] Sep 06, 2016 4:04 PM Cell [phone] 8:00-4:00 Monday-Friday Office [phone]

## 2016-08-28 NOTE — Progress Notes (Signed)
PROGRESS NOTE        PATIENT DETAILS Name: Sean [name] Age: 83 y.o. Sex: male Date of Birth: 28-Oct-1924 Admit Date: 08/20/2016 Admitting Physician Ivor Costa, MD [id number] Jenny Reichmann, MD  Brief Narrative: Patient is a 83 y.o. male with past medical history of COPD, atrial fibrillation not on anticoagulation due to GI bleeding, liver cirrhosis, currently a resident of a skilled nursing facility-admitted for acute hypoxemic respiratory failure and septic shock due to healthcare associated pneumonia. See below for further details  Subjective: Unresponsive-on 100% nonrebreather mask.   Assessment/Plan: Acute on chronic hypoxemic respiratory failure: Likely secondary to HCAP-on 100% nonrebreather mask this morning. Now on comfort measures.   Septic shock due to healthcare associated pneumonia: Unfortunately in spite of IV antibiotics, he continued to deteriorate and become hypotensive. After extensive discussion with family, transitioned to full comfort measures.  Suspect inpatient death in a few hours/days. Note, respiratory virus panel positive for RSV. Blood cultures negative so far.  Acute renal failure:Oliguric-suspect that due to hypotension patient is now in  acute tubular necrosis. Since comfort care, no role in checking a renal panel.   Acute metabolic encephalopathy: Due to hypoxia, hypotension. Remains unresponsive this morning.  A. fib with RVR: Heart rate in the low 100s, unable to use beta blocker or Cardizem and due to hypotension. Not a anticoagulation candidate. Chads2vasc score 3  GERD: No longer on PPI-comfort measures.  COPD: No rhonchi or-I suspect his acute respiratory failure is from pneumonia rather than COPD exacerbation.   History of chronic hypoxemic respiratory failure: Likely secondary to COPD, on home O2-2.5 L/m-currently on 100% nonrebreather mask  Left heel/left lower extremity wounds: All of these ulcers were present prior to  admission-comfort measures in place. Seen by wound care on 1/12.  Severe protein calorie malnutrition: No role for supplements-comfort measures and is  Recent left tibia/fibular fracture: Followed by Dr Erlinda Hong in the outpatient setting.  History of alcoholic liver cirrhosis  Goals of care/palliative care: Long discussion with spouse and son and other family members of the past few days. Unfortunately patient is very cachectic, malnourished and seems to be actively dying. DO NOT RESUSCITATE in place, after multiple discussions with this M.D., and with the palliative care team-family now agreeable with full comfort measures. Suspect inpatient death in the next few hours/days.    DVT Prophylaxis: Prophylactic Lovenox   Code Status:  DNR  Family Communication: Son at bedside  Disposition Plan: Remain inpatient-suspect in patient death over the next few hours/days  Antimicrobial agents: Anti-infectives    Start     Dose/Rate Route Frequency Ordered Stop   08/10/16 0400  levofloxacin (LEVAQUIN) IVPB 750 mg  Status:  Discontinued     750 mg 100 mL/hr over 90 Minutes Intravenous Every 48 hours 08/10/2016 0516 08/09/16 1050   08/09/16 0400  vancomycin (VANCOCIN) 500 mg in sodium chloride 0.9 % 100 mL IVPB  Status:  Discontinued     500 mg 100 mL/hr over 60 Minutes Intravenous Every 24 hours 08/10/2016 0516 08/07/2016 1002   07/28/2016 1000  aztreonam (AZACTAM) 1 g in dextrose 5 % 50 mL IVPB  Status:  Discontinued     1 g 100 mL/hr over 30 Minutes Intravenous Every 8 hours 07/31/2016 0516 08/06/2016 1002   08/06/2016 0130  levofloxacin (LEVAQUIN) IVPB 750 mg     750 mg 100 mL/hr  over 90 Minutes Intravenous  Once 08/14/2016 0128 07/30/2016 0310   08/26/2016 0130  aztreonam (AZACTAM) 2 g in dextrose 5 % 50 mL IVPB     2 g 100 mL/hr over 30 Minutes Intravenous  Once 08/04/2016 0128 08/07/2016 0210   08/19/2016 0130  vancomycin (VANCOCIN) IVPB 1000 mg/200 mL premix     1,000 mg 200 mL/hr over 60 Minutes Intravenous   Once 07/31/2016 0128 08/27/2016 0240      Procedures: None  CONSULTS:  pulmonary/intensive care  Time spent: 15 minutes-Greater than 50% of this time was spent in counseling, explanation of diagnosis, planning of further management, and coordination of care.  MEDICATIONS: Scheduled Meds: . dextromethorphan-guaiFENesin  1 tablet Oral BID  . feeding supplement (PRO-STAT SUGAR FREE 64)  30 mL Oral BID  . LORazepam  0.5 mg Intravenous BID  . sodium chloride  500 mL Intravenous Once   Continuous Infusions: . sodium chloride 20 mL/hr at 08/09/16 1201   PRN Meds:.acetaminophen, bisacodyl, glycopyrrolate, ipratropium, levalbuterol, LORazepam, magnesium hydroxide, morphine injection, morphine injection, ondansetron   PHYSICAL EXAM: Vital signs: Vitals:   08/09/16 0131 08/09/16 0435 08/09/16 0806 09/10/2016 0810  BP: (!) 77/58  (!) 94/44   Pulse: (!) 105  98   Resp: 16  (!) 32   Temp:  97.9 F (36.6 C) 98.7 F (37.1 C)   TempSrc:  Axillary Axillary Oral  SpO2: 97%  97%   Weight:      Height:       Filed Weights   08/09/2016 0125 08/10/2016 0654  Weight: 47.2 kg (104 lb) 43.9 kg (96 lb 12.5 oz)   Body mass index is 13.13 kg/m.   General appearance :Unresponsive- otherwise appears comfortable Neck: supple Resp:Good air entry bilaterally, transmitted upper airway sounds CVS: S1 S2 irregular GI: Bowel sounds present, Non tender  I have personally reviewed following labs and imaging studies  LABORATORY DATA: CBC:  Recent Labs Lab 08/16/2016 0110  WBC 3.1*  NEUTROABS 2.6  HGB 10.9*  HCT 32.8*  MCV 94.8  PLT 0000000    Basic Metabolic Panel:  Recent Labs Lab 08/01/2016 0110  NA 135  K 3.6  CL 100*  CO2 21*  GLUCOSE 137*  BUN 43*  CREATININE 1.41*  CALCIUM 9.2    GFR: Estimated Creatinine Clearance: 22.1 mL/min (by C-G formula based on SCr of 1.41 mg/dL (H)).  Liver Function Tests:  Recent Labs Lab 08/27/2016 0110  AST 50*  ALT 19  ALKPHOS 176*  BILITOT 0.9    PROT 6.0*  ALBUMIN 2.3*   No results for input(s): LIPASE, AMYLASE in the last 168 hours. No results for input(s): AMMONIA in the last 168 hours.  Coagulation Profile:  Recent Labs Lab 08/27/2016 0410  INR 1.47    Cardiac Enzymes: No results for input(s): CKTOTAL, CKMB, CKMBINDEX, TROPONINI in the last 168 hours.  BNP (last 3 results) No results for input(s): PROBNP in the last 8760 hours.  HbA1C: No results for input(s): HGBA1C in the last 72 hours.  CBG: No results for input(s): GLUCAP in the last 168 hours.  Lipid Profile: No results for input(s): CHOL, HDL, LDLCALC, TRIG, CHOLHDL, LDLDIRECT in the last 72 hours.  Thyroid Function Tests: No results for input(s): TSH, T4TOTAL, FREET4, T3FREE, THYROIDAB in the last 72 hours.  Anemia Panel: No results for input(s): VITAMINB12, FOLATE, FERRITIN, TIBC, IRON, RETICCTPCT in the last 72 hours.  Urine analysis:    Component Value Date/Time   COLORURINE AMBER (A) 08/09/2016 0200  APPEARANCEUR HAZY (A) 08/06/2016 0200   LABSPEC 1.019 07/28/2016 0200   PHURINE 5.0 08/17/2016 0200   GLUCOSEU NEGATIVE 08/13/2016 0200   GLUCOSEU NEGATIVE 02/27/2016 1538   HGBUR NEGATIVE 08/03/2016 0200   BILIRUBINUR NEGATIVE 08/22/2016 0200   KETONESUR NEGATIVE 08/16/2016 0200   PROTEINUR NEGATIVE 08/07/2016 0200   UROBILINOGEN 1.0 02/27/2016 1538   NITRITE NEGATIVE 07/30/2016 0200   LEUKOCYTESUR SMALL (A) 08/04/2016 0200    Sepsis Labs: Lactic Acid, Venous    Component Value Date/Time   LATICACIDVEN 5.6 ([place]) 08/27/2016 0634    MICROBIOLOGY: Recent Results (from the past 240 hour(s))  Blood Culture (routine x 2)     Status: None (Preliminary result)   Collection Time: 08/14/2016  1:10 AM  Result Value Ref Range Status   Specimen Description BLOOD LEFT ARM  Final   Special Requests BOTTLES DRAWN AEROBIC AND ANAEROBIC 5ML  Final   Culture NO GROWTH 2 DAYS  Final   Report Status PENDING  Incomplete  Blood Culture (routine x 2)      Status: None (Preliminary result)   Collection Time: 08/18/2016  1:33 AM  Result Value Ref Range Status   Specimen Description BLOOD RIGHT ARM  Final   Special Requests BOTTLES DRAWN AEROBIC AND ANAEROBIC 5ML  Final   Culture NO GROWTH 2 DAYS  Final   Report Status PENDING  Incomplete  Urine culture     Status: Abnormal   Collection Time: 08/15/2016  2:00 AM  Result Value Ref Range Status   Specimen Description URINE, RANDOM  Final   Special Requests NONE  Final   Culture >=100,000 COLONIES/mL ENTEROCOCCUS FAECALIS (A)  Final   Report Status 08/10/2016 FINAL  Final   Organism ID, Bacteria ENTEROCOCCUS FAECALIS (A)  Final      Susceptibility   Enterococcus faecalis - MIC*    AMPICILLIN <=2 SENSITIVE Sensitive     LEVOFLOXACIN >=8 RESISTANT Resistant     NITROFURANTOIN <=16 SENSITIVE Sensitive     VANCOMYCIN 1 SENSITIVE Sensitive     * >=100,000 COLONIES/mL ENTEROCOCCUS FAECALIS  Respiratory Panel by PCR     Status: Abnormal   Collection Time: 08/23/2016  7:54 AM  Result Value Ref Range Status   Adenovirus NOT DETECTED NOT DETECTED Final   Coronavirus 229E NOT DETECTED NOT DETECTED Final   Coronavirus HKU1 NOT DETECTED NOT DETECTED Final   Coronavirus NL63 NOT DETECTED NOT DETECTED Final   Coronavirus OC43 NOT DETECTED NOT DETECTED Final   Metapneumovirus NOT DETECTED NOT DETECTED Final   Rhinovirus / Enterovirus NOT DETECTED NOT DETECTED Final   Influenza A NOT DETECTED NOT DETECTED Final   Influenza B NOT DETECTED NOT DETECTED Final   Parainfluenza Virus 1 NOT DETECTED NOT DETECTED Final   Parainfluenza Virus 2 NOT DETECTED NOT DETECTED Final   Parainfluenza Virus 3 NOT DETECTED NOT DETECTED Final   Parainfluenza Virus 4 NOT DETECTED NOT DETECTED Final   Respiratory Syncytial Virus DETECTED (A) NOT DETECTED Final    Comment: CRITICAL RESULT CALLED TO, READ BACK BY AND VERIFIED WITH: A. Rosana Hoes RN 16:05 08/27/2016 ([email or web address])    Bordetella pertussis NOT DETECTED NOT DETECTED Final    Chlamydophila pneumoniae NOT DETECTED NOT DETECTED Final   Mycoplasma pneumoniae NOT DETECTED NOT DETECTED Final  MRSA PCR Screening     Status: None   Collection Time: 08/18/2016  7:57 AM  Result Value Ref Range Status   MRSA by PCR NEGATIVE NEGATIVE Final    Comment:  The GeneXpert MRSA Assay (FDA approved for NASAL specimens only), is one component of a comprehensive MRSA colonization surveillance program. It is not intended to diagnose MRSA infection nor to guide or monitor treatment for MRSA infections.     RADIOLOGY STUDIES/RESULTS: Dg Chest 2 View  Result Date: 07/24/2016 CLINICAL DATA:  Hypoxia. EXAM: CHEST  2 VIEW COMPARISON:  Radiographs of June 20, 2016. FINDINGS: Stable cardiomediastinal silhouette. Atherosclerosis of thoracic aorta is noted. No pneumothorax or pleural effusion is noted. Hyperexpansion of the lungs is noted. Old left clavicular fracture is noted. IMPRESSION: No active cardiopulmonary disease. Hyperexpansion of the lungs suggesting chronic obstructive pulmonary disease. Aortic atherosclerosis. Electronically Signed   By: Marijo Conception, M.D.   On: 07/24/2016 10:13   Ct Angio Chest Pe W And/or Wo Contrast  Result Date: 07/24/2016 CLINICAL DATA:  Hypoxemia, left rib and chest pain EXAM: CT ANGIOGRAPHY CHEST WITH CONTRAST TECHNIQUE: Multidetector CT imaging of the chest was performed using the standard protocol during bolus administration of intravenous contrast. Multiplanar CT image reconstructions and MIPs were obtained to evaluate the vascular anatomy. CONTRAST:  85 mL Isovue 370 COMPARISON:  None. FINDINGS: Cardiovascular: Satisfactory opacification of the pulmonary arteries to the segmental level. No evidence of pulmonary embolism. Normal heart size. No pericardial effusion. Abdominal aortic atherosclerosis. Mediastinum/Nodes: No enlarged mediastinal, hilar, or axillary lymph nodes. Thyroid gland, trachea, and esophagus demonstrate no significant  findings. Lungs/Pleura: Bilateral centrilobular emphysema. Biapical scarring. Bilateral lower lobe airspace disease dependently which may reflect atelectasis versus pneumonia including aspiration pneumonia. No pleural effusion or pneumothorax. Upper Abdomen: No acute abnormality. Hypodense, fluid attenuating 2.9 cm left renal cyst. Musculoskeletal: No acute osseous abnormality. Old L2 vertebral body compression fracture status post augmentation. Review of the MIP images confirms the above findings. IMPRESSION: 1. No evidence of pulmonary embolus. 2. Bilateral lower lobe airspace disease dependently which may reflect atelectasis versus pneumonia including aspiration pneumonia. 3.  Emphysema. [id number]) Electronically Signed   By: Kathreen Devoid   On: 07/24/2016 14:21   Dg Chest Port 1 View  Result Date: 08/07/2016 CLINICAL DATA:  Shortness of breath. Decreased oxygen saturations. Tachycardia. Fever. EXAM: PORTABLE CHEST 1 VIEW COMPARISON:  07/24/2016 FINDINGS: Cardiac enlargement without vascular congestion. Emphysematous changes in the lungs. New left perihilar and basilar airspace infiltration likely representing pneumonia. Patchy infiltrates also suggested in the right hilar and basilar region but less prominent. No pleural effusions. No pneumothorax. Calcified and tortuous aorta. IMPRESSION: Chronic emphysematous changes in the lungs with developing infiltrates, more prominent on the left, suggesting pneumonia. Cardiac enlargement without vascular congestion. Electronically Signed   By: Lucienne Capers M.D.   On: 08/16/2016 01:43     LOS: 3 days   Oren Binet, MD  [facility] Pager:[phone]  If 7PM-7AM, please contact night-coverage [email or web address] Password [email or web address] Aug 13, 2016, 9:02 AM

## 2016-08-28 NOTE — Telephone Encounter (Signed)
Called to advise on msg. LMOM may D/c Immobilizer

## 2016-08-28 NOTE — Progress Notes (Signed)
Patient with sudden increase in HR around 1800; rate sustaining 150-170's, but does not appear to be in any distress. Dr. Hilma Favors notified; stated to continue current plan of care. Shortly afterwards pt became apneic and HR decreased to 30-50's. Patient appeared to remain comfortable. Patient's spouse, son, and another family member remained at bedside. They declined to have monitors in room turned off at this time. RN discussed with them the process of dying and what was currently taking place. Family calm and appropriate for situation. At 1850 patient to be noted without a heart beat on the monitor. Lack of heart beat auscultated for 1 minute each by Larena Glassman and Pecolia Ades. Time of death 69. [facility] on call notified to fill out death certificate.  Joellen Jersey, RN.

## 2016-08-28 DEATH — deceased

## 2017-09-11 IMAGING — RF DG C-ARM 61-120 MIN
1 series · 7 of 7 positions shown · non-contrast
Comparison: 06/20/2016

CLINICAL DATA: Post left tibial fracture repair

EXAM:
DG C-ARM 61-120 MIN; LEFT TIBIA AND FIBULA - 2 VIEW

[Series 1: run · 7 of 7 slices shown]
[im 1/7]
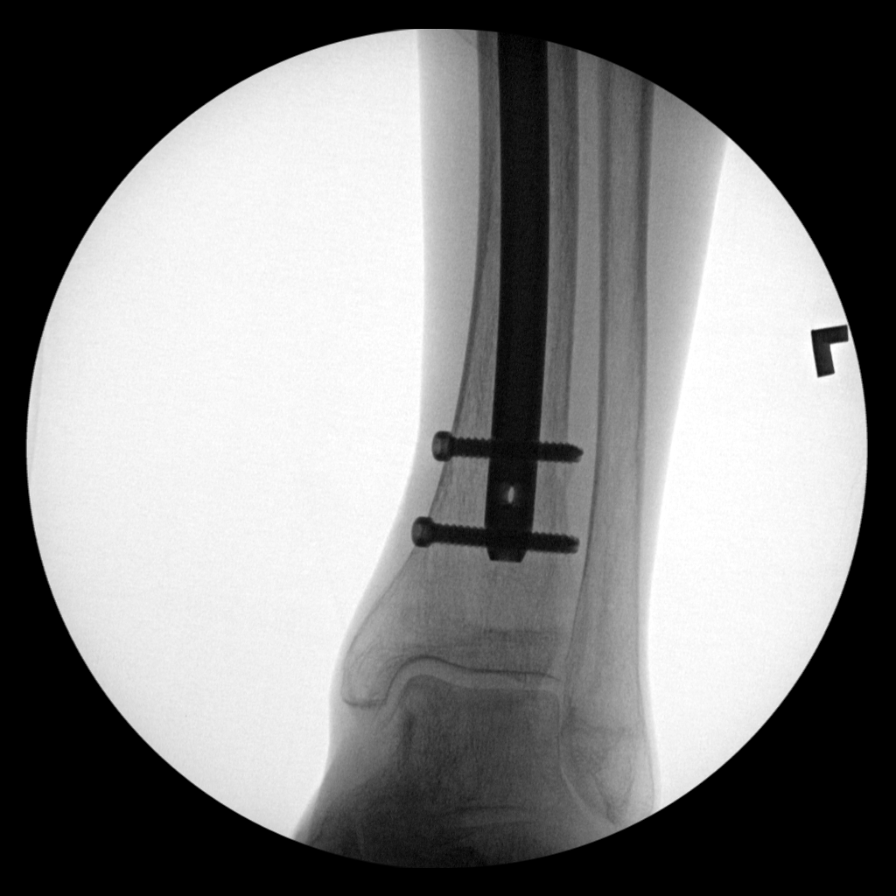
[im 2/7]
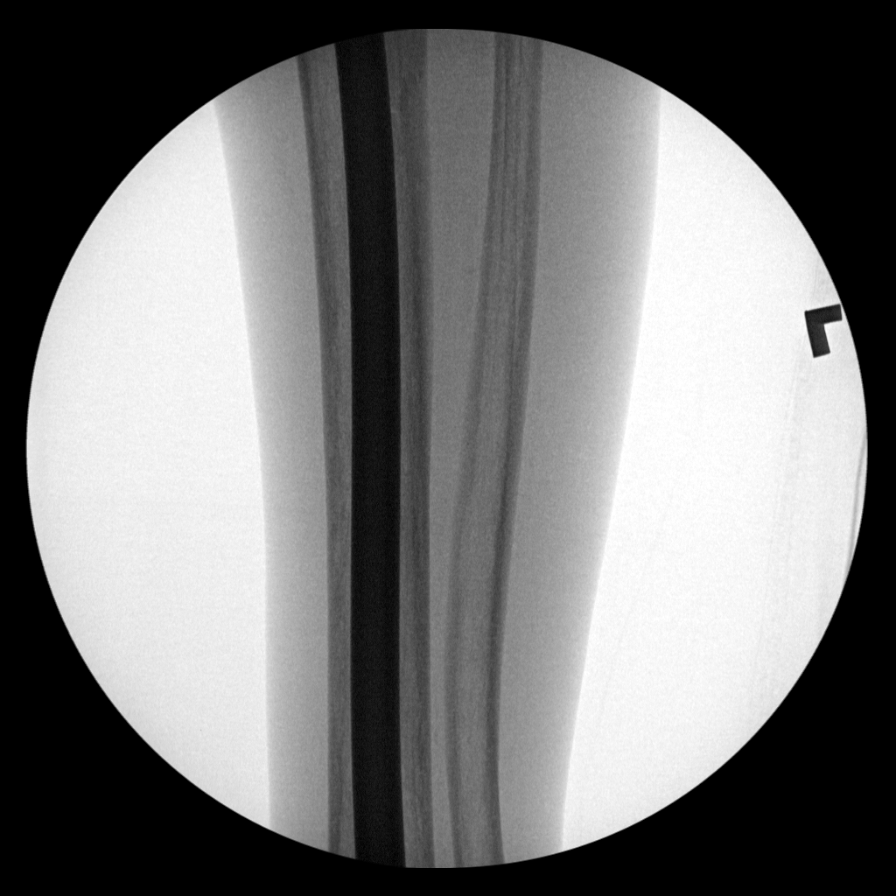
[im 3/7]
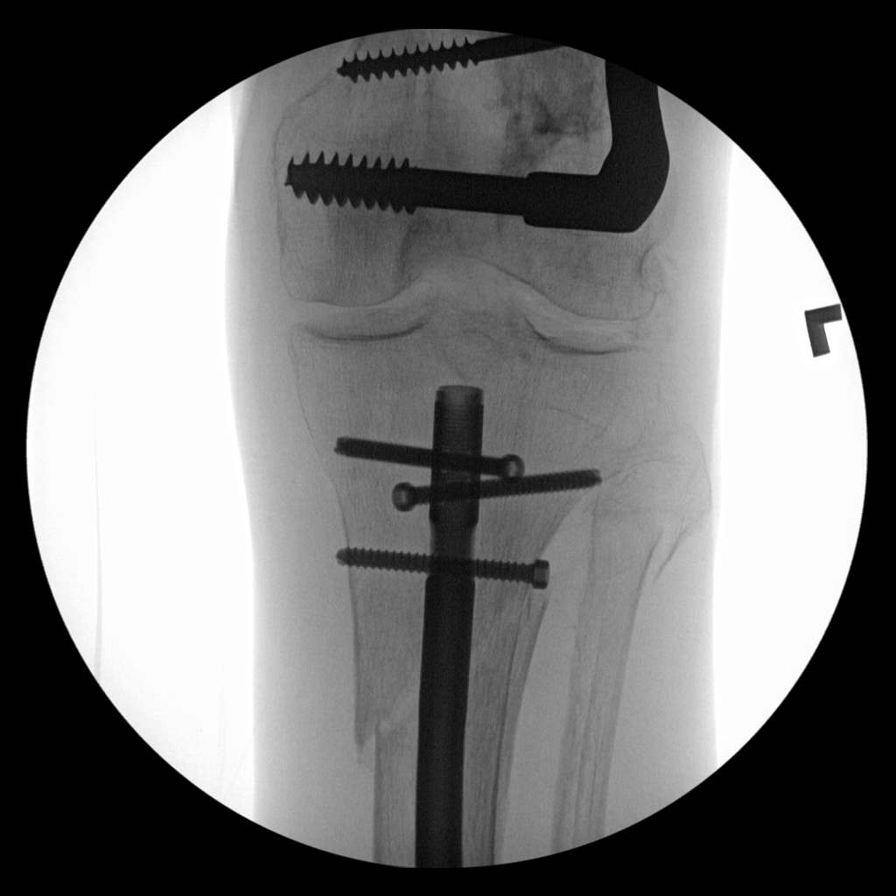
[im 4/7]
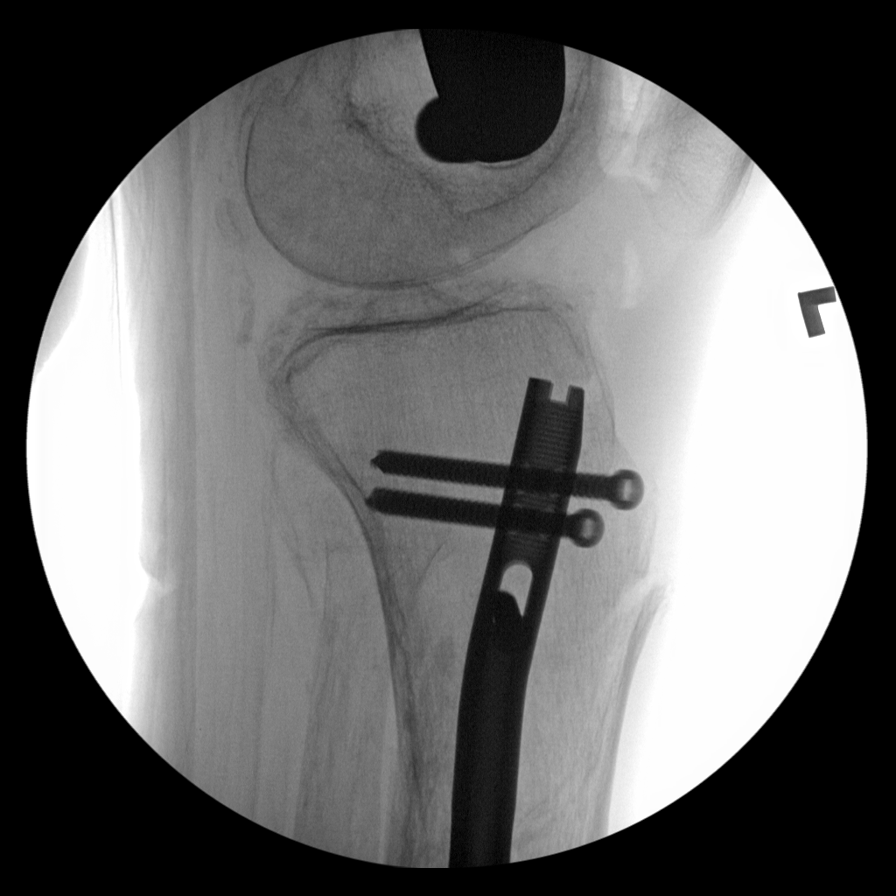
[im 5/7]
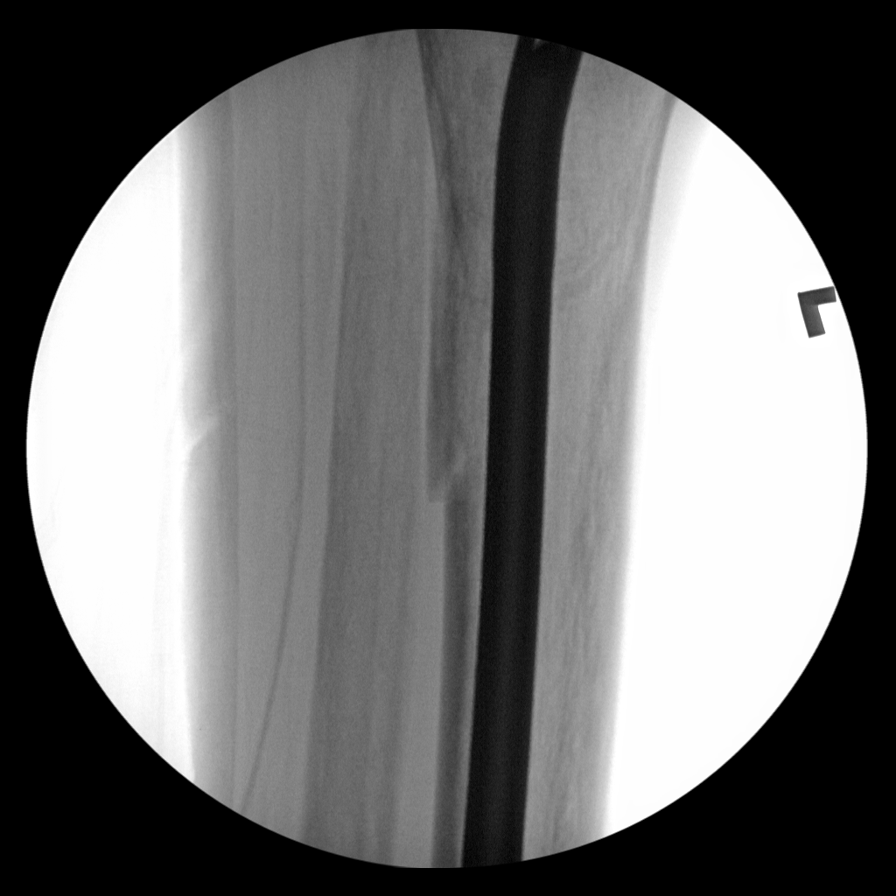
[im 6/7]
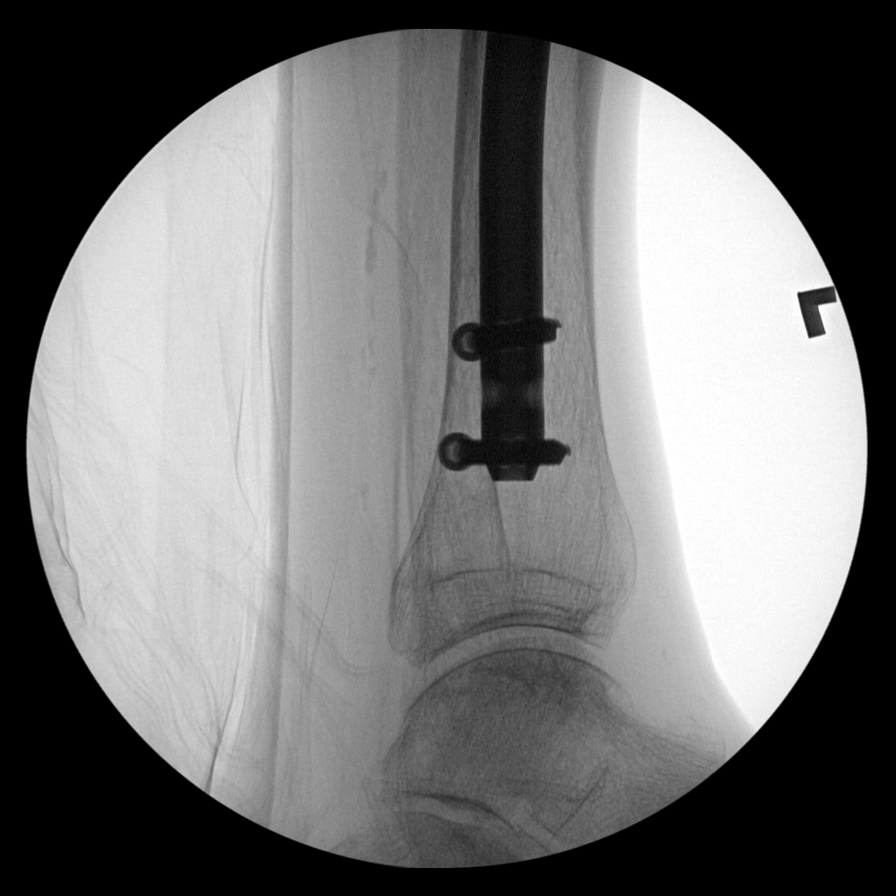
[im 7/7]
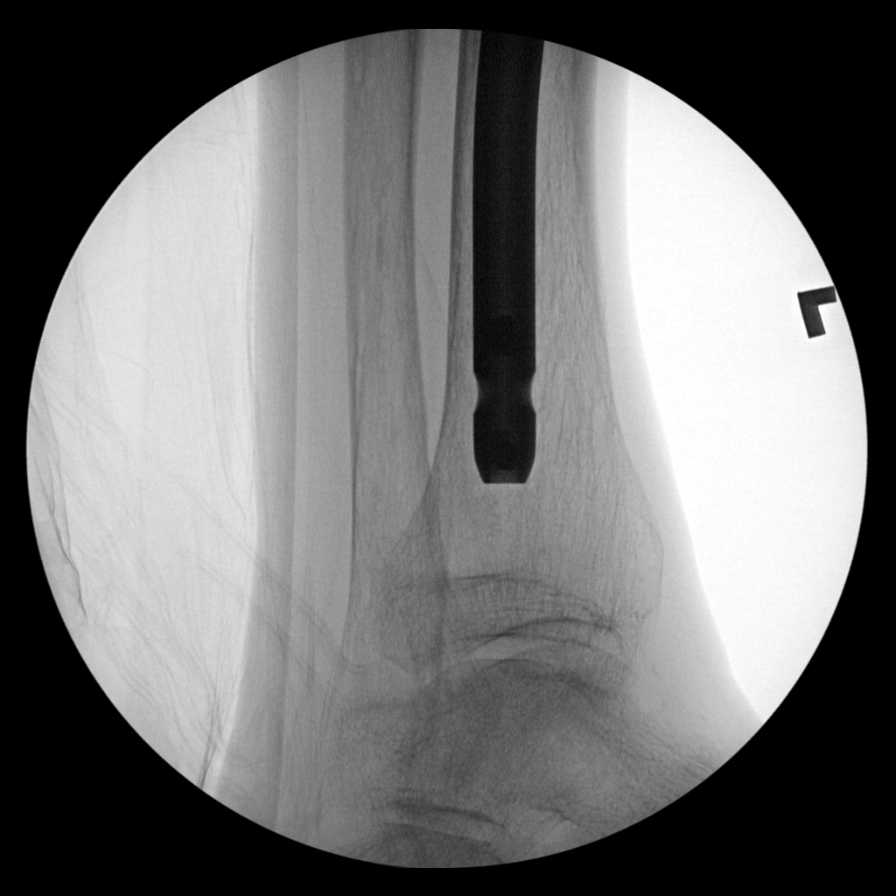

[7 of 7 positions shown; findings below may reference images not displayed]

FINDINGS: Seven views of the left tibia and fibula submitted. The patient is
status post intraoperative repair of proximal left tibial fracture.
There is intra medullary rod and 3 metallic fixation screws are
noted in proximal left tibia. Two metallic fixation screws are noted
in and distal tibia. There is improvement in alignment. Again noted
minimal displaced fracture in proximal left fibula with improvement
in alignment.
IMPRESSION: The patient is status post intraoperative repair of proximal left
tibial fracture. There is intra medullary rod and 3 metallic
fixation screws are noted in proximal left tibia. Two metallic
fixation screws are noted in and distal tibia. There is improvement
in alignment. Again noted minimal displaced fracture in proximal
left fibula with improvement in alignment.

Fluoroscopy time was 2 minutes 25 seconds. Please see the operative
report.

## 2017-10-28 IMAGING — CR DG CHEST 1V PORT
1 series · 1 of 1 positions shown · non-contrast
Comparison: 07/24/2016

CLINICAL DATA: Shortness of breath. Decreased oxygen saturations.
Tachycardia. Fever.

EXAM:
PORTABLE CHEST 1 VIEW

[AP]
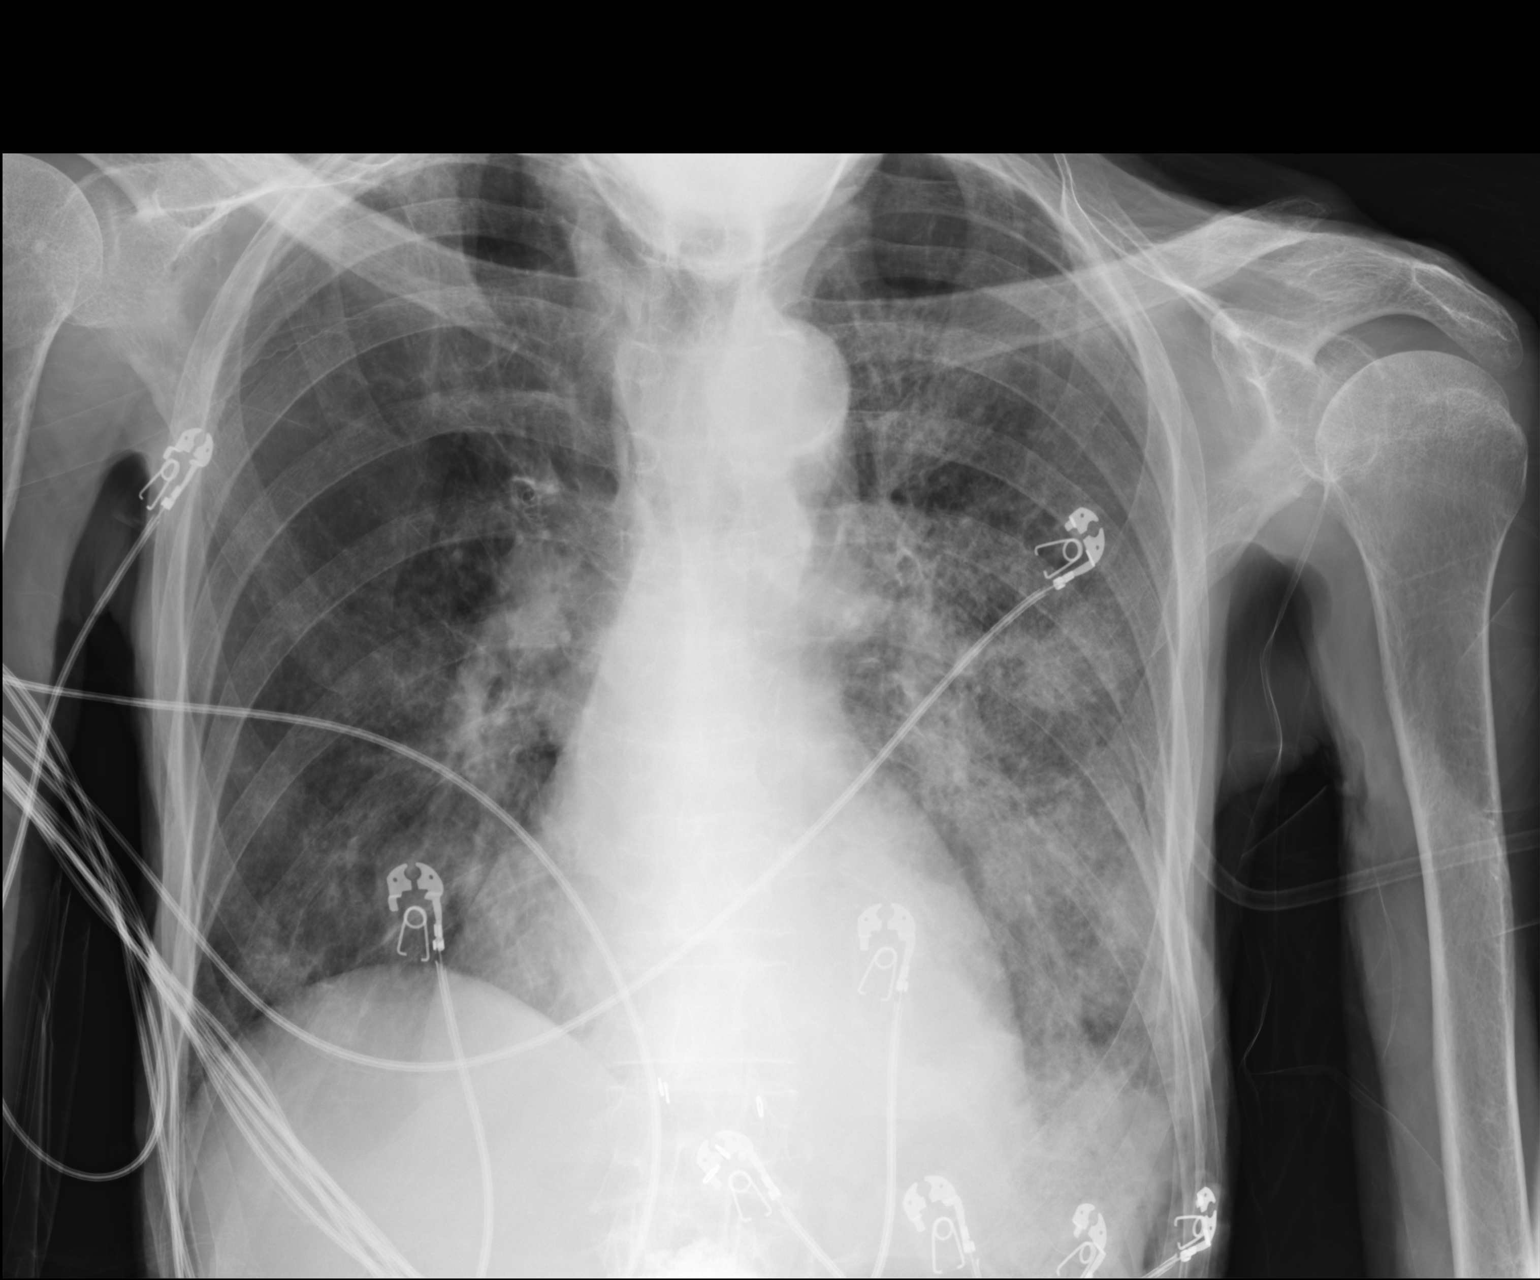

[1 of 1 positions shown; findings below may reference images not displayed]

FINDINGS: Cardiac enlargement without vascular congestion. Emphysematous
changes in the lungs. New left perihilar and basilar airspace
infiltration likely representing pneumonia. Patchy infiltrates also
suggested in the right hilar and basilar region but less prominent.
No pleural effusions. No pneumothorax. Calcified and tortuous aorta.
IMPRESSION: Chronic emphysematous changes in the lungs with developing
infiltrates, more prominent on the left, suggesting pneumonia.
Cardiac enlargement without vascular congestion.
